# Patient Record
Sex: Female | Born: 1935 | Race: White | Hispanic: No | Marital: Married | Smoking: Never smoker
Health system: Southern US, Community
[De-identification: ages and names within clinical notes are randomized; demographics above are authoritative.]

## PROBLEM LIST (undated history)

## (undated) DIAGNOSIS — I639 Cerebral infarction, unspecified: Secondary | ICD-10-CM

## (undated) DIAGNOSIS — E079 Disorder of thyroid, unspecified: Secondary | ICD-10-CM

## (undated) DIAGNOSIS — K222 Esophageal obstruction: Secondary | ICD-10-CM

## (undated) DIAGNOSIS — I739 Peripheral vascular disease, unspecified: Secondary | ICD-10-CM

## (undated) DIAGNOSIS — K8689 Other specified diseases of pancreas: Secondary | ICD-10-CM

## (undated) DIAGNOSIS — I4891 Unspecified atrial fibrillation: Secondary | ICD-10-CM

## (undated) DIAGNOSIS — H9190 Unspecified hearing loss, unspecified ear: Secondary | ICD-10-CM

## (undated) DIAGNOSIS — S82409A Unspecified fracture of shaft of unspecified fibula, initial encounter for closed fracture: Secondary | ICD-10-CM

## (undated) DIAGNOSIS — E785 Hyperlipidemia, unspecified: Secondary | ICD-10-CM

## (undated) DIAGNOSIS — G459 Transient cerebral ischemic attack, unspecified: Secondary | ICD-10-CM

## (undated) DIAGNOSIS — K219 Gastro-esophageal reflux disease without esophagitis: Secondary | ICD-10-CM

## (undated) DIAGNOSIS — Z8719 Personal history of other diseases of the digestive system: Secondary | ICD-10-CM

## (undated) DIAGNOSIS — I10 Essential (primary) hypertension: Secondary | ICD-10-CM

## (undated) DIAGNOSIS — C4490 Unspecified malignant neoplasm of skin, unspecified: Secondary | ICD-10-CM

## (undated) DIAGNOSIS — M797 Fibromyalgia: Secondary | ICD-10-CM

## (undated) HISTORY — DX: Hyperlipidemia, unspecified: E78.5

## (undated) HISTORY — DX: Peripheral vascular disease, unspecified: I73.9

## (undated) HISTORY — DX: Essential (primary) hypertension: I10

## (undated) HISTORY — DX: Fibromyalgia: M79.7

## (undated) HISTORY — DX: Disorder of thyroid, unspecified: E07.9

---
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
## 1968-03-26 HISTORY — PX: VAGINAL HYSTERECTOMY: SUR661

## 1978-03-26 HISTORY — PX: PARATHYROIDECTOMY: SHX19

## 2004-03-26 DIAGNOSIS — I639 Cerebral infarction, unspecified: Secondary | ICD-10-CM

## 2004-03-26 HISTORY — DX: Cerebral infarction, unspecified: I63.9

## 2004-03-31 ENCOUNTER — Inpatient Hospital Stay (HOSPITAL_COMMUNITY): Admission: EM | Admit: 2004-03-31 | Discharge: 2004-04-04 | Payer: Self-pay | Admitting: Emergency Medicine

## 2004-11-02 ENCOUNTER — Ambulatory Visit: Payer: Self-pay

## 2004-12-28 ENCOUNTER — Ambulatory Visit: Payer: Self-pay

## 2006-01-01 ENCOUNTER — Ambulatory Visit: Payer: Self-pay

## 2007-01-07 ENCOUNTER — Ambulatory Visit: Payer: Self-pay

## 2008-01-08 ENCOUNTER — Ambulatory Visit: Payer: Self-pay

## 2008-08-18 ENCOUNTER — Encounter: Admission: RE | Admit: 2008-08-18 | Discharge: 2008-08-18 | Payer: Self-pay | Admitting: Rheumatology

## 2008-09-03 ENCOUNTER — Encounter: Admission: RE | Admit: 2008-09-03 | Discharge: 2008-09-03 | Payer: Self-pay | Admitting: Rheumatology

## 2009-01-04 ENCOUNTER — Encounter: Payer: Self-pay | Admitting: Internal Medicine

## 2009-01-04 LAB — CONVERTED CEMR LAB
AST: 20 units/L
Alkaline Phosphatase: 47 units/L
Basophils Relative: 0.8 %
CO2: 28 meq/L
Calcium: 9.6 mg/dL
Creatinine, Ser: 0.8 mg/dL
Eosinophils Relative: 2.2 %
Glucose, Bld: 81 mg/dL
Hemoglobin: 12.8 g/dL
Neutrophils Relative %: 67.5 %
Platelets: 301 10*3/uL
RBC: 4.03 M/uL
Sodium: 140 meq/L
Total Bilirubin: 0.8 mg/dL
Triglyceride fasting, serum: 89 mg/dL
WBC: 6.7 10*3/uL

## 2009-02-23 DIAGNOSIS — K8689 Other specified diseases of pancreas: Secondary | ICD-10-CM

## 2009-02-23 HISTORY — DX: Other specified diseases of pancreas: K86.89

## 2009-03-01 ENCOUNTER — Ambulatory Visit: Payer: Self-pay

## 2009-03-19 ENCOUNTER — Encounter: Payer: Self-pay | Admitting: Internal Medicine

## 2009-03-19 ENCOUNTER — Inpatient Hospital Stay (HOSPITAL_COMMUNITY): Admission: EM | Admit: 2009-03-19 | Discharge: 2009-03-25 | Payer: Self-pay | Admitting: Emergency Medicine

## 2009-03-19 ENCOUNTER — Encounter (INDEPENDENT_AMBULATORY_CARE_PROVIDER_SITE_OTHER): Payer: Self-pay | Admitting: Internal Medicine

## 2009-03-19 LAB — CONVERTED CEMR LAB
Albumin: 3.9 g/dL
BUN: 17 mg/dL
Chloride: 100 meq/L
Creatinine, Ser: 1.05 mg/dL
HCT: 42.7 %
Hemoglobin: 14.4 g/dL
Hgb A1c MFr Bld: 5.8 %
MCV: 93.9 fL
Monocytes Relative: 6 %
Platelets: 227 10*3/uL
Potassium: 4 meq/L
RBC: 4.55 M/uL
Sodium: 139 meq/L
Total Bilirubin: 0.6 mg/dL
Total Protein: 7 g/dL

## 2009-03-20 ENCOUNTER — Encounter: Payer: Self-pay | Admitting: Internal Medicine

## 2009-03-20 LAB — CONVERTED CEMR LAB
BUN: 14 mg/dL
Cholesterol: 208 mg/dL
Creatinine, Ser: 0.73 mg/dL
Glucose, Bld: 103 mg/dL
HDL: 49 mg/dL
MCV: 93.3 fL
RDW: 13.1 %
Sodium: 137 meq/L
TSH: 2.431 microintl units/mL
Triglyceride fasting, serum: 184 mg/dL

## 2009-03-21 ENCOUNTER — Encounter (INDEPENDENT_AMBULATORY_CARE_PROVIDER_SITE_OTHER): Payer: Self-pay | Admitting: Internal Medicine

## 2009-03-21 ENCOUNTER — Ambulatory Visit: Payer: Self-pay | Admitting: Vascular Surgery

## 2009-03-24 ENCOUNTER — Encounter: Payer: Self-pay | Admitting: Internal Medicine

## 2009-03-24 LAB — CONVERTED CEMR LAB
BUN: 15 mg/dL
Chloride: 102 meq/L
Hemoglobin: 12.3 g/dL
Platelets: 229 10*3/uL
WBC: 5.6 10*3/uL

## 2009-03-25 ENCOUNTER — Encounter: Payer: Self-pay | Admitting: Internal Medicine

## 2009-03-25 LAB — CONVERTED CEMR LAB
BUN: 16 mg/dL
Glucose, Bld: 92 mg/dL
HCT: 37.9 %
Hemoglobin: 12.8 g/dL
Lymphocytes, automated: 31 %
MCV: 93.9 fL
Platelets: 247 10*3/uL
Potassium: 4.2 meq/L
Sodium: 140 meq/L

## 2009-03-31 ENCOUNTER — Encounter: Payer: Self-pay | Admitting: Internal Medicine

## 2009-03-31 DIAGNOSIS — K5900 Constipation, unspecified: Secondary | ICD-10-CM | POA: Insufficient documentation

## 2009-03-31 DIAGNOSIS — Z87448 Personal history of other diseases of urinary system: Secondary | ICD-10-CM | POA: Insufficient documentation

## 2009-03-31 DIAGNOSIS — E785 Hyperlipidemia, unspecified: Secondary | ICD-10-CM | POA: Insufficient documentation

## 2009-03-31 DIAGNOSIS — Z8679 Personal history of other diseases of the circulatory system: Secondary | ICD-10-CM | POA: Insufficient documentation

## 2009-03-31 DIAGNOSIS — K449 Diaphragmatic hernia without obstruction or gangrene: Secondary | ICD-10-CM | POA: Insufficient documentation

## 2009-03-31 DIAGNOSIS — I10 Essential (primary) hypertension: Secondary | ICD-10-CM

## 2009-03-31 DIAGNOSIS — IMO0001 Reserved for inherently not codable concepts without codable children: Secondary | ICD-10-CM | POA: Insufficient documentation

## 2009-03-31 DIAGNOSIS — I4891 Unspecified atrial fibrillation: Secondary | ICD-10-CM | POA: Insufficient documentation

## 2009-04-01 ENCOUNTER — Ambulatory Visit: Payer: Self-pay | Admitting: Internal Medicine

## 2009-07-11 ENCOUNTER — Ambulatory Visit: Payer: Self-pay | Admitting: Internal Medicine

## 2009-07-11 DIAGNOSIS — J309 Allergic rhinitis, unspecified: Secondary | ICD-10-CM | POA: Insufficient documentation

## 2009-07-18 ENCOUNTER — Encounter: Payer: Self-pay | Admitting: Internal Medicine

## 2009-07-20 ENCOUNTER — Telehealth: Payer: Self-pay | Admitting: Internal Medicine

## 2009-08-15 ENCOUNTER — Ambulatory Visit: Payer: Self-pay | Admitting: Internal Medicine

## 2009-10-04 ENCOUNTER — Ambulatory Visit: Payer: Self-pay | Admitting: Internal Medicine

## 2009-12-14 ENCOUNTER — Ambulatory Visit: Payer: Self-pay | Admitting: Internal Medicine

## 2009-12-14 DIAGNOSIS — L02619 Cutaneous abscess of unspecified foot: Secondary | ICD-10-CM

## 2009-12-14 DIAGNOSIS — L03119 Cellulitis of unspecified part of limb: Secondary | ICD-10-CM

## 2009-12-14 DIAGNOSIS — N390 Urinary tract infection, site not specified: Secondary | ICD-10-CM

## 2009-12-14 LAB — CONVERTED CEMR LAB
Bilirubin Urine: NEGATIVE
Protein, U semiquant: NEGATIVE

## 2009-12-22 ENCOUNTER — Encounter: Payer: Self-pay | Admitting: Internal Medicine

## 2009-12-27 ENCOUNTER — Ambulatory Visit: Payer: Self-pay | Admitting: Internal Medicine

## 2009-12-28 ENCOUNTER — Ambulatory Visit: Payer: Self-pay | Admitting: Internal Medicine

## 2009-12-28 ENCOUNTER — Ambulatory Visit: Payer: Self-pay

## 2009-12-28 ENCOUNTER — Telehealth: Payer: Self-pay | Admitting: Internal Medicine

## 2009-12-28 DIAGNOSIS — S91009A Unspecified open wound, unspecified ankle, initial encounter: Secondary | ICD-10-CM

## 2009-12-28 DIAGNOSIS — S81809A Unspecified open wound, unspecified lower leg, initial encounter: Secondary | ICD-10-CM

## 2009-12-28 DIAGNOSIS — S81009A Unspecified open wound, unspecified knee, initial encounter: Secondary | ICD-10-CM | POA: Insufficient documentation

## 2009-12-28 DIAGNOSIS — I739 Peripheral vascular disease, unspecified: Secondary | ICD-10-CM | POA: Insufficient documentation

## 2009-12-29 ENCOUNTER — Telehealth: Payer: Self-pay | Admitting: Cardiovascular Disease

## 2010-01-02 ENCOUNTER — Encounter (HOSPITAL_BASED_OUTPATIENT_CLINIC_OR_DEPARTMENT_OTHER)
Admission: RE | Admit: 2010-01-02 | Discharge: 2010-03-16 | Payer: Self-pay | Source: Home / Self Care | Attending: General Surgery | Admitting: General Surgery

## 2010-01-03 ENCOUNTER — Ambulatory Visit: Payer: Self-pay | Admitting: Cardiovascular Disease

## 2010-01-09 ENCOUNTER — Encounter: Payer: Self-pay | Admitting: Internal Medicine

## 2010-01-12 ENCOUNTER — Encounter: Payer: Self-pay | Admitting: Internal Medicine

## 2010-01-13 ENCOUNTER — Ambulatory Visit: Payer: Self-pay | Admitting: Family Medicine

## 2010-01-30 ENCOUNTER — Encounter: Payer: Self-pay | Admitting: Internal Medicine

## 2010-02-09 ENCOUNTER — Ambulatory Visit: Payer: Self-pay | Admitting: Family Medicine

## 2010-02-22 ENCOUNTER — Ambulatory Visit (HOSPITAL_COMMUNITY)
Admission: RE | Admit: 2010-02-22 | Discharge: 2010-02-22 | Payer: Self-pay | Source: Home / Self Care | Admitting: General Surgery

## 2010-02-22 ENCOUNTER — Telehealth: Payer: Self-pay | Admitting: Family Medicine

## 2010-02-28 ENCOUNTER — Encounter: Payer: Self-pay | Admitting: Internal Medicine

## 2010-03-08 ENCOUNTER — Encounter: Payer: Self-pay | Admitting: Internal Medicine

## 2010-04-12 ENCOUNTER — Telehealth: Payer: Self-pay | Admitting: Family Medicine

## 2010-04-25 NOTE — Progress Notes (Addendum)
Summary: PV appt  Phone Note Call from Patient   Caller: niece Pamela [name] [phone] Reason for Call: Talk to Nurse Summary of Call: pt had doppler yesterday, wants to know when she neds appt Initial call taken by: Glynda Jaeger,  December 29, 2009 10:38 AM  Follow-up for Phone Call        This pt has a new PV appt with Dr Excell Seltzer on 10/28.  Dr Clifton James has availability next week.  I will schedule this pt to see Dr Clifton James next week due to abnormal results on LEA.  Left message for [name] to call back. Julieta Gutting, RN, BSN  December 29, 2009 12:17 PM  I spoke with [name] and she did get the message about appt with Dr Excell Seltzer on 10/28 but she feels that the pt should be seen sooner.  I have rescheduled the pt to see Dr Clifton James on 01/03/10 for follow-up of abnormal PV study.  Follow-up by: Julieta Gutting, RN, BSN,  December 29, 2009 12:25 PM

## 2010-04-25 NOTE — Assessment & Plan Note (Addendum)
Summary: FU Pamela [name]   Vital Signs:  Patient profile:   77 year old female Height:      65 inches (165.10 cm) Weight:      112.0 pounds (50.91 kg) O2 Sat:      95 % on Room air Temp:     98.1 degrees F (36.72 degrees C) oral Pulse rate:   54 / minute BP sitting:   132 / 82  (left arm) Cuff size:   regular  Vitals Entered By: Orlan Leavens (October 04, 2009 1:43 PM)  O2 Flow:  Room air CC: 3 month follow-up Is Patient Diabetic? No Pain Assessment Patient in pain? no        Primary Care Provider:  Newt Lukes MD  CC:  3 month follow-up.  History of Present Illness: here for followup -  1)PAF - concverted back to NSR spont before hosp dc 02/2009- now on  BBloc - no known recurrence  denies problems with med - no fatigue or dizzy symptoms - no CP or palp or SOB  2) fibromyalgia - chroinc pain in neck and shoulder/arm, predom right side followed prev with dr. Kellie Simmering (rheum) for same -  for years, has taken occ 1/2 tab vicodin with good relief of pain symptoms   3) hx CVA affecting right upper body, head, neck and RUE -  no weakness or numbness but +parasthesia and pain that "come and go" -  also complete loss of hearing on right side due to CVA no new weakness or  other chroinc deficits   4) HTN - prev running high due to taking claritin D for allg- no CP, Ha or recurrent CVA/TIA symptoms  - no vision changes - allg better since taking OTC meds - home log BP readings reviewed -  SBP 130s-140s -reports compliance with ongoing medical treatment and no changes in medication dose or frequency. denies adverse side effects related to current therapy.   5) GERD - occ symptoms rlated to foods - no abd pain - use tums as needed, less than 1/week   Current Medications (verified): 1)  Metoprolol Tartrate 25 Mg Tabs (Metoprolol Tartrate) .... Take 1 Two Times A Day 2)  Senokot 8.6 Mg Tabs (Sennosides) .... Take 1 By Mouth Two Times A Day - Hold For Loose Bowels 3)  Aspirin 325 Mg  Tabs (Aspirin) .... Once Daily 4)  Calcium Carbonate 600 Mg Tabs (Calcium Carbonate) .... Once Daily 5)  Vicodin 5-500 Mg Tabs (Hydrocodone-Acetaminophen) .... Take 1/2 - 1 Tab By Mouth Two Times A Day 6)  Vitamin C 500 Mg Tabs (Ascorbic Acid) .... Once Daily 7)  Centrum Silver  Tabs (Multiple Vitamins-Minerals) .... Once Daily 8)  Fish Oil 300 Mg Caps (Omega-3 Fatty Acids) .Marland Kitchen.. 1 By Mouth Bid 9)  Losartan Potassium 100 Mg Tabs (Losartan Potassium) .Marland Kitchen.. 1 By Mouth Once Daily  Allergies (verified): 1)  ! * Antibiotics 2)  ! * Cholestrol Meds  Past History:  Past Medical History: Hyperlipidemia Hypertension PAF - spont conv NSR 02/2009 hosp fibromyalgia Noncontricting schatzki ring per EGD 02/2009 Abnormal CT abdomen: cystic area in pancreatic head (02/2009) hx hyperparathroid, s/p surg in 1980s  MD roster: rheum Kellie Simmering  GI - [facility] -[place] [facility]    Review of Systems  The patient denies chest pain, syncope, peripheral edema, and depression.    Physical Exam  General:  spry, alert, well-developed, well-nourished, and cooperative to examination.   niece [name] at side Lungs:  normal respiratory effort,  no intercostal retractions or use of accessory muscles; normal breath sounds bilaterally - no crackles and no wheezes.    Heart:  normal rate, regular rhythm, no murmur, and no rub. BLE without edema.  Neurologic:  alert & oriented X3 and cranial nerves II-XII symetrically intact.  strength normal in all extremities, sensation intact to light touch, and gait normal. speech fluent without dysarthria or aphasia; follows commands with good comprehension.    Impression & Recommendations:  Problem # 1:  PAROXYSMAL ATRIAL FIBRILLATION (ICD-427.31)  hosp 02/2009 again reviewed - in nsr today - cont bbloc as also helping htn - no anticoag indicated with adv age  Her updated medication list for this problem includes:    Metoprolol Tartrate 25 Mg Tabs (Metoprolol tartrate) .Marland Kitchen...  Take 1 two times a day    Aspirin 325 Mg Tabs (Aspirin) ..... Once daily  Problem # 2:  HYPERTENSION (ICD-401.9)  Her updated medication list for this problem includes:    Metoprolol Tartrate 25 Mg Tabs (Metoprolol tartrate) .Marland Kitchen... Take 1 two times a day    Losartan Potassium 100 Mg Tabs (Losartan potassium) .Marland Kitchen... 1 by mouth once daily  amlopdiine caused fatigue--- suspect more likely related to Bbloc - but given PAF hosp 02/2009 - leave low dose BBloc and change amlodipine to ARB avoid diuretic due to tendency towards dehydration -  Time spent with patient/family 25 minutes, more than 50% of this time was spent counseling patient onhypertension  BP today: 132/82 Prior BP: 160/72 (08/15/2009)  Labs Reviewed: K+: 4.2 (03/25/2009) Creat: : 0.81 (03/25/2009)   Chol: 208 (03/20/2009)   HDL: 49 (03/20/2009)   LDL: 122 (03/20/2009)   TG: 184 (03/20/2009)  Problem # 3:  HYPERLIPIDEMIA (ICD-272.4)  on fish oil, no statins  Labs Reviewed: SGOT: 33 (03/19/2009)   SGPT: 17 (03/19/2009)   HDL:49 (03/20/2009), 64.90 (01/04/2009)  LDL:122 (03/20/2009), 68 (29/52/8413)  Chol:208 (03/20/2009), 151 (01/04/2009)  Trig:184 (03/20/2009), 89 (01/04/2009)  Problem # 4:  FIBROMYALGIA (ICD-729.1)  Her updated medication list for this problem includes:    Aspirin 325 Mg Tabs (Aspirin) ..... Once daily    Vicodin 5-500 Mg Tabs (Hydrocodone-acetaminophen) .Marland Kitchen... Take 1/2 - 1 tab by mouth two times a day  continue as needed vicodin as reports has been released from care of rheum who prev rx'd same -  use and Se reviewed today - likely exac constipation symptoms   Complete Medication List: 1)  Metoprolol Tartrate 25 Mg Tabs (Metoprolol tartrate) .... Take 1 two times a day 2)  Senokot 8.6 Mg Tabs (Sennosides) .... Take 1 by mouth two times a day - hold for loose bowels 3)  Aspirin 325 Mg Tabs (Aspirin) .... Once daily 4)  Calcium Carbonate 600 Mg Tabs (Calcium carbonate) .... Once daily 5)  Vicodin  5-500 Mg Tabs (Hydrocodone-acetaminophen) .... Take 1/2 - 1 tab by mouth two times a day 6)  Vitamin C 500 Mg Tabs (Ascorbic acid) .... Once daily 7)  Centrum Silver Tabs (Multiple vitamins-minerals) .... Once daily 8)  Fish Oil 300 Mg Caps (Omega-3 fatty acids) .Marland Kitchen.. 1 by mouth bid 9)  Losartan Potassium 100 Mg Tabs (Losartan potassium) .Marland Kitchen.. 1 by mouth once daily  Patient Instructions: 1)  it was good to see you today. 2)  continue medications as ongoing - no changes recommended 3)  Please keep 3 month follow-up appointment at [facility] as previously scheduled, may call here between now and then if any problems.  Prescriptions: METOPROLOL TARTRATE 25 MG TABS (  METOPROLOL TARTRATE) take 1 two times a day  #60 x 5   Entered by:   Orlan Leavens   Authorized by:   Newt Lukes MD   Signed by:   Orlan Leavens on 10/04/2009   Method used:   Electronically to        [facility]. [address] [phone]* (retail)       [address] [place], [place]  [id number]       Ph: [phone]       Fax: [phone]   RxID:   [id number]

## 2010-04-25 NOTE — Assessment & Plan Note (Addendum)
Summary: NEW PT TO EST/TRANSFER FROM DR LESCHBER/CLE   Vital Signs:  Patient profile:   77 year old female Height:      65 inches Weight:      114 pounds BMI:     19.04 Temp:     97.4 degrees F oral Pulse rate:   60 / minute Pulse rhythm:   regular BP sitting:   136 / 72  (right arm) Cuff size:   regular  Vitals Entered By: Linde Gillis CMA Duncan Dull) (January 13, 2010 1:49 PM) CC: new patient, establish care   History of Present Illness: 77 yo here to establish care.   Left leg wound- wound precipitated by injury to left ankle 10/2009- log rolled onto foot.  Has been going to wound center every week.  On Doxycyline 100 mg bid.  Previously failed a course of Septra.  denies fever - no other skin wounds no difficulty walking Pain improving with Hydrocodone but still wakes up early in the morning with pain. Gabapentin not helping and making her dizzy and forgetful.   PVD-  followed by Dr. Clifton James.  Last visit was last week.  Has severe disease in left leg but bilateral disease.  Does not wish to have invasive procedures.     HTN - was very high in vascular office last week. Amlodipine 5 mg was added to Losartan 100 mg daily and Metoprolol 25 mg two times a day.  Has not noticed any side effects and BP is much better controlled.   Current Medications (verified): 1)  Metoprolol Tartrate 25 Mg Tabs (Metoprolol Tartrate) .... Take 1 Two Times A Day 2)  Senokot 8.6 Mg Tabs (Sennosides) .... Take 1 By Mouth Two Times A Day - Hold For Loose Bowels 3)  Aspirin 325 Mg Tabs (Aspirin) .... Once Daily 4)  Calcium Carbonate 600 Mg Tabs (Calcium Carbonate) .... Once Daily 5)  Vicodin 5-500 Mg Tabs (Hydrocodone-Acetaminophen) .... Take 1/2 - 1 Tab By Mouth Two Times A Day 6)  Vitamin C 500 Mg Tabs (Ascorbic Acid) .... Once Daily 7)  Centrum Silver  Tabs (Multiple Vitamins-Minerals) .... Once Daily 8)  Fish Oil 300 Mg Caps (Omega-3 Fatty Acids) .Marland Kitchen.. 1 Cap Once Daily 9)  Losartan Potassium 100 Mg  Tabs (Losartan Potassium) .Marland Kitchen.. 1 By Mouth Once Daily 10)  Bactroban 2 % Oint (Mupirocin) .... Apply Two Times A Day To Affected Skin With Dressing Change 11)  Amlodipine Besylate 5 Mg Tabs (Amlodipine Besylate) .... Take One Tablet By Mouth Daily 12)  Doxycycline Hyclate 100 Mg Caps (Doxycycline Hyclate) .... Take One Tablet By Mouth Two Times A Day 13)  Tramadol Hcl 50 Mg  Tabs (Tramadol Hcl) .Marland Kitchen.. 1-2 Tab Two Times A Day As Needed For Pain  Allergies: 1)  ! * Antibiotics 2)  ! * Cholestrol Meds  Past History:  Past Medical History: Last updated: 01/03/2010 Hyperlipidemia Hypertension  PAF - spont conv NSR 02/2009 hosp fibromyalgia Nonconstricting schatzki ring - EGD 02/2009 Abnormal CT abdomen: cystic area in pancreatic head (02/2009) hx hyperparathroid, s/p surg in 1980s PAD CVA 2007    MD roster: Justine Null Kellie Simmering  GI - [facility] -[place] ent    Past Surgical History: Last updated: 01/03/2010 Appendectomy (1940) Hysterectomy (1970's) Stroke (2007) - right side residual pain parathyroidectomy - 1980s  Family History: Last updated: 04/01/2009 Family History Breast cancer 1st degree relative <50 (parent) Family History of Prostate CA 1st degree relative <50 (parent)  Social History: Last updated: 04/01/2009 Never Smoked  no alcohol widowed, lives alone in her home in [place] supportive family nearby [place] and independant with ADLs including driving, cooking, bathing, dressing, etc  Risk Factors: Alcohol Use: 0 (04/01/2009) Exercise: yes (04/01/2009)  Risk Factors: Smoking Status: never (04/01/2009)  Review of Systems      See HPI General:  Denies fever and malaise. Eyes:  Denies blurring. ENT:  Complains of decreased hearing; denies difficulty swallowing. CV:  Denies chest pain or discomfort. Resp:  Denies shortness of breath. GI:  Denies abdominal pain and change in bowel habits. MS:  Denies muscle weakness. Derm:  Denies rash. Neuro:  Denies  headaches and visual disturbances. Psych:  Denies anxiety and depression. Endo:  Denies cold intolerance and heat intolerance.  Physical Exam  General:  alert, well-developed, well-nourished, and cooperative to examination, with her niece [name]. Head:  normocephalic and atraumatic.   Eyes:  vision grossly intact and pupils equal.   Ears:  R ear normal and L ear normal.   Nose:  no external deformity.   Mouth:  MMM Lungs:  normal respiratory effort, no intercostal retractions or use of accessory muscles; normal breath sounds bilaterally - no crackles and no wheezes.    Heart:  normal rate, regular rhythm, no murmur, and no rub. RLE without edema Abdomen:  soft, non-tender, normal bowel sounds, no distention; no masses and no appreciable hepatomegaly or splenomegaly.   Extremities:  LLE leg wound- bandage c/d/i, no edema. Neurologic:  alert & oriented X3 and gait normal.   Psych:  Oriented X3, memory intact for recent and remote, normally interactive, good eye contact, not anxious appearing, not depressed appearing, and not agitated.      Impression & Recommendations:  Problem # 1:  UNSPECIFIED PERIPHERAL VASCULAR DISEASE (ICD-443.9) Assessment Unchanged Followed by Dr. Clifton James, will follow up with him once her wound has healed.  Problem # 2:  WOUND, LEG (ICD-891.0) Assessment: Improved Finish abx, continue follow up with wound center. Will try Tramadol in addition to her vicodin as she is concerned that increase dose with cause constipation (h/o impaction).  Problem # 3:  HYPERTENSION (ICD-401.9) Assessment: Improved Improved with addition of Amlodipine.   Her updated medication list for this problem includes:    Metoprolol Tartrate 25 Mg Tabs (Metoprolol tartrate) .Marland Kitchen... Take 1 two times a day    Losartan Potassium 100 Mg Tabs (Losartan potassium) .Marland Kitchen... 1 by mouth once daily    Amlodipine Besylate 5 Mg Tabs (Amlodipine besylate) .Marland Kitchen... Take one tablet by mouth daily  Complete  Medication List: 1)  Metoprolol Tartrate 25 Mg Tabs (Metoprolol tartrate) .... Take 1 two times a day 2)  Senokot 8.6 Mg Tabs (Sennosides) .... Take 1 by mouth two times a day - hold for loose bowels 3)  Aspirin 325 Mg Tabs (Aspirin) .... Once daily 4)  Calcium Carbonate 600 Mg Tabs (Calcium carbonate) .... Once daily 5)  Vicodin 5-500 Mg Tabs (Hydrocodone-acetaminophen) .... Take 1/2 - 1 tab by mouth two times a day 6)  Vitamin C 500 Mg Tabs (Ascorbic acid) .... Once daily 7)  Centrum Silver Tabs (Multiple vitamins-minerals) .... Once daily 8)  Fish Oil 300 Mg Caps (Omega-3 fatty acids) .Marland Kitchen.. 1 cap once daily 9)  Losartan Potassium 100 Mg Tabs (Losartan potassium) .Marland Kitchen.. 1 by mouth once daily 10)  Bactroban 2 % Oint (Mupirocin) .... Apply two times a day to affected skin with dressing change 11)  Amlodipine Besylate 5 Mg Tabs (Amlodipine besylate) .... Take one tablet by mouth daily 12)  Doxycycline Hyclate 100 Mg Caps (Doxycycline hyclate) .... Take one tablet by mouth two times a day 13)  Tramadol Hcl 50 Mg Tabs (Tramadol hcl) .Marland Kitchen.. 1-2 tab two times a day as needed for pain  Patient Instructions: 1)  Great to meet you. 2)  Please stop taking the Neurontin. 3)  You can try increasing your Vicodin to 1 tablet at bedtime and use Tramadol as needed for pain. 4)  Please come see me in a few weeks. Prescriptions: TRAMADOL HCL 50 MG  TABS (TRAMADOL HCL) 1-2 tab two times a day as needed for pain  #60 x 0   Entered and Authorized by:   Ruthe Mannan MD   Signed by:   Ruthe Mannan MD on 01/13/2010   Method used:   Print then Give to Patient   RxID:   [id number]    Orders Added: 1)  New Patient Level III [99203]    Current Allergies (reviewed today): ! * ANTIBIOTICS ! * CHOLESTROL MEDS

## 2010-04-25 NOTE — Assessment & Plan Note (Addendum)
Summary: NEW / [employer] MEDICARE / NWS #   Vital Signs:  Patient profile:   77 year old female Height:      65 inches (165.10 cm) Weight:      112.0 pounds (50.91 kg) O2 Sat:      96 % on Room air Temp:     98.0 degrees F (36.67 degrees C) oral Pulse rate:   58 / minute BP sitting:   130 / 72  (left arm) Cuff size:   regular  Vitals Entered By: Orlan Leavens (April 01, 2009 11:07 AM)  O2 Flow:  Room air CC: New patient Is Patient Diabetic? No Pain Assessment Patient in pain? no        Primary Care Provider:  Newt Lukes MD  CC:  New patient.  History of Present Illness: new pt to me and our practice here to est care -  recent hosp at [facility] 1225-12/31 for LLQ abd pain - experienced PAF and had throrough GI eval  1)PAF - concverted back to NSR spont before dc - now on new BBloc -  denies problems with med - no CP or palp or SOB  2) fibromyalgia - chroinc pain in neck and shoulder/arm, predom right side follows with dr. Kellie Simmering -  takes 1/2 tab vicodin with good releif of pain symptoms   3) hx CVA affecting right upper body, head, neck and RUE -  no weakness or numbness but +parasthesia and pain that "come and go" -  also complete loss of hearing on right side due to CVA no new weakness or  other chroinc deficits   4) constipation - exac by narc use - takes OTC sennakot with generally good relief but still hard and small BMs no abd pain at this time and none since dc from hosp last week  Preventive Screening-Counseling & Management  Alcohol-Tobacco     Alcohol drinks/day: 0     Alcohol Counseling: not indicated; patient does not drink     Smoking Status: never     Tobacco Counseling: not indicated; no tobacco use  Caffeine-Diet-Exercise     Does Patient Exercise: yes     Type of exercise: walking, outdoors     Exercise Counseling: not indicated; exercise is adequate  Safety-Violence-Falls     Fall Risk Counseling: not indicated; no significant falls  noted  Clinical Review Panels:  Prevention   Last Mammogram:  No specific mammographic evidence of malignancy.   (02/23/2009)  Immunizations   Last Tetanus Booster:  Historical (03/26/2004)   Last Flu Vaccine:  Historical (11/24/2008)   Last Pneumovax:  Historical (03/26/2006)  Lipid Management   Cholesterol:  208 (03/20/2009)   LDL (bad choesterol):  122 (03/20/2009)   HDL (good cholesterol):  49 (03/20/2009)   Triglycerides:  184 (03/20/2009)  CBC   WBC:  7.0 (03/25/2009)   RBC:  4.04 (03/25/2009)   Hgb:  12.8 (03/25/2009)   Hct:  37.9 (03/25/2009)   Platelets:  247 (03/25/2009)   MCV  93.9 (03/25/2009)   RDW  13.3 (03/25/2009)   PMN:  55 (03/25/2009)   Monos:  11 (03/25/2009)   Eosinophils:  2 (03/25/2009)   Basophil:  1 (03/25/2009)  Complete Metabolic Panel   Glucose:  92 (03/25/2009)   Sodium:  140 (03/25/2009)   Potassium:  4.2 (03/25/2009)   Chloride:  103 (03/25/2009)   CO2:  32 (03/25/2009)   BUN:  16 (03/25/2009)   Creatinine:  0.81 (03/25/2009)   Albumin:  3.9 (  03/19/2009)   Total Protein:  7.0 (03/19/2009)   Calcium:  8.9 (03/25/2009)   Total Bili:  0.6 (03/19/2009)   Alk Phos:  86 (03/19/2009)   SGPT (ALT):  17 (03/19/2009)   SGOT (AST):  33 (03/19/2009)   Current Medications (verified): 1)  Metoprolol Tartrate 25 Mg Tabs (Metoprolol Tartrate) .... Take 1 Two Times A Day 2)  Senokot 8.6 Mg Tabs (Sennosides) .... Take 1 Once Daily As Needed 3)  Aspirin 325 Mg Tabs (Aspirin) .... Once Daily 4)  Calcium Carbonate 600 Mg Tabs (Calcium Carbonate) .... Once Daily 5)  Vicodin 5-500 Mg Tabs (Hydrocodone-Acetaminophen) .... Take 1/2 By Mouth Once Daily 6)  Vitamin C 500 Mg Tabs (Ascorbic Acid) .... Once Daily 7)  Centrum Silver  Tabs (Multiple Vitamins-Minerals) .... Once Daily  Allergies (verified): 1)  ! * Antibiotics 2)  ! * Cholestrol Meds  Past History:  Past Medical History: Hyperlipidemia Hypertension PAF - spont conv NSR 02/2009  hosp fibromyalgia Noncontricting schatzki ring per EGD 02/2009 Abnormal CT abdomen: cystic area in pancreatic head (02/2009) hx hyperparathroid, s/p surg in 1980s  MD rooster: rheum - [facility] - [facility]  Past Surgical History: Appendectomy (2025) Hysterectomy (1970's) Stroke (2007) - right side residual pain parathyroidectomy - 1980s  Family History: Family History Breast cancer 1st degree relative <50 (parent) Family History of Prostate CA 1st degree relative <50 (parent)  Social History: Never Smoked no alcohol widowed, lives alone in her home in [place] supportive family nearby [place] and independant with ADLs including driving, cooking, bathing, dressing, etc Smoking Status:  never Does Patient Exercise:  yes  Review of Systems       see HPI above. I have reviewed all other systems and they were negative.   Physical Exam  General:  spry, alert, well-developed, well-nourished, and cooperative to examination.   niece [name] at side Eyes:  vision grossly intact; pupils equal, round and reactive to light.  conjunctiva and lids normal.    Ears:  normal pinnae bilaterally, without erythema, swelling, or tenderness to palpation. TMs clear, without effusion, or cerumen impaction. Hearing diminished on left (with hearing aide) Mouth:  teeth and gums in good repair; mucous membranes moist, without lesions or ulcers. oropharynx clear without exudate, no erythema.  Lungs:  normal respiratory effort, no intercostal retractions or use of accessory muscles; normal breath sounds bilaterally - no crackles and no wheezes.    Heart:  normal rate, regular rhythm, no murmur, and no rub. BLE without edema. normal DP pulses and normal cap refill in all 4 extremities    Abdomen:  soft, non-tender, normal bowel sounds, no distention; no masses and no appreciable hepatomegaly or splenomegaly.   Msk:  No deformity or scoliosis noted of thoracic or lumbar spine.  arthritic changes (senile) in B  hands but FROM with pain Neurologic:  alert & oriented X3 and cranial nerves II-XII symetrically intact.  strength normal in all extremities, sensation intact to light touch, and gait normal. speech fluent without dysarthria or aphasia; follows commands with good comprehension.  Psych:  Oriented X3, memory intact for recent and remote, normally interactive, good eye contact, not anxious appearing, not depressed appearing, and not agitated.      Impression & Recommendations:  Problem # 1:  HYPERTENSION (ICD-401.9)  Her updated medication list for this problem includes:    Metoprolol Tartrate 25 Mg Tabs (Metoprolol tartrate) .Marland Kitchen... Take 1 two times a day  BP today: 130/72  Labs Reviewed: K+: 4.2 (03/25/2009) Creat: :  0.81 (03/25/2009)   Chol: 208 (03/20/2009)   HDL: 49 (03/20/2009)   LDL: 122 (03/20/2009)   TG: 184 (03/20/2009)  Orders: Prescription Created Electronically [phone])  Problem # 2:  HYPERLIPIDEMIA (ICD-272.4) diet controlled Labs Reviewed: SGOT: 33 (03/19/2009)   SGPT: 17 (03/19/2009)   HDL:49 (03/20/2009), 64.90 (01/04/2009)  LDL:122 (03/20/2009), 68 (60/45/4098)  Chol:208 (03/20/2009), 151 (01/04/2009)  Trig:184 (03/20/2009), 89 (01/04/2009)  Problem # 3:  FIBROMYALGIA (ICD-729.1) cont to follow with dr. Kellie Simmering as ongoing Her updated medication list for this problem includes:    Aspirin 325 Mg Tabs (Aspirin) ..... Once daily    Vicodin 5-500 Mg Tabs (Hydrocodone-acetaminophen) .Marland Kitchen... Take 1/2 by mouth once daily  Problem # 4:  CONSTIPATION (ICD-564.00) exac by narc use for fm -  advised inc senna use recent hosp, labd and testing results reviewed today  Her updated medication list for this problem includes:    Senokot 8.6 Mg Tabs (Sennosides) .Marland Kitchen... Take 1 by mouth two times a day - hold for loose bowels  Discussed dietary fiber measures and increased water intake.   Problem # 5:  PAROXYSMAL ATRIAL FIBRILLATION (ICD-427.31)  hosp reviewed - in nsr today - cont  bbloc as also helping htn - no anticoag indicated with adv age Her updated medication list for this problem includes:    Metoprolol Tartrate 25 Mg Tabs (Metoprolol tartrate) .Marland Kitchen... Take 1 two times a day    Aspirin 325 Mg Tabs (Aspirin) ..... Once daily  Orders: Prescription Created Electronically [phone])  Problem # 6:  CEREBROVASCULAR ACCIDENT, HX OF (ICD-V12.50) right side heas/neck/ue pain deficits - (vs d/t fm?) cont asa and med mgmt of [facility] risks  Time spent with patient 45 minutes, more than 50% of this time was spent counseling patient on recent hospitalization, prevention and treatment of constipation and review of chronic medical conditions.  Complete Medication List: 1)  Metoprolol Tartrate 25 Mg Tabs (Metoprolol tartrate) .... Take 1 two times a day 2)  Senokot 8.6 Mg Tabs (Sennosides) .... Take 1 by mouth two times a day - hold for loose bowels 3)  Aspirin 325 Mg Tabs (Aspirin) .... Once daily 4)  Calcium Carbonate 600 Mg Tabs (Calcium carbonate) .... Once daily 5)  Vicodin 5-500 Mg Tabs (Hydrocodone-acetaminophen) .... Take 1/2 by mouth once daily 6)  Vitamin C 500 Mg Tabs (Ascorbic acid) .... Once daily 7)  Centrum Silver Tabs (Multiple vitamins-minerals) .... Once daily 8)  Fish Oil 300 Mg Caps (Omega-3 fatty acids) .Marland Kitchen.. 1 by mouth bid  Patient Instructions: 1)  it was good to see you today.  2)  labs and tests from recent hospitalization reviewed - you look like you are doing well - 3)  increase sennokot to two times a day as discussed - other medications as ongoing - metoprolol prescription has been electronically submitted to your pharmacy. Please take as directed. Contact our office if you believe you're having problems with the medication(s).  4)  Please schedule a follow-up appointment in 3-4 months, sooner if problems.  Prescriptions: METOPROLOL TARTRATE 25 MG TABS (METOPROLOL TARTRATE) take 1 two times a day  #60 x 5   Entered by:   Orlan Leavens   Authorized  by:   Newt Lukes MD   Signed by:   Orlan Leavens on 04/01/2009   Method used:   Electronically to        [facility] [phone]* (retail)       [address]  [place], [place]  [id number]       Ph: [phone]       Fax: [phone]   RxID:   [id number]    Immunization History:  Tetanus/Td Immunization History:    Tetanus/Td:  historical (03/26/2004)  Influenza Immunization History:    Influenza:  historical (11/24/2008)  Pneumovax Immunization History:    Pneumovax:  historical (03/26/2006)    Mammogram  Procedure date:  02/23/2009  Findings:      No specific mammographic evidence of malignancy.

## 2010-04-25 NOTE — Consult Note (Addendum)
Summary: [facility]  [facility]   Imported By: Sherian Rein 01/18/2010 13:53:14  _____________________________________________________________________  External Attachment:    Type:   Image     Comment:   External Document

## 2010-04-25 NOTE — Assessment & Plan Note (Addendum)
Summary: 3WK FOLLOW UP / LFW   Vital Signs:  Patient profile:   77 year old Pamela [name] Height:      65 inches Weight:      113 pounds BMI:     18.87 Temp:     97.8 degrees F oral Pulse rate:   Pamela / minute Pulse rhythm:   regular BP sitting:   124 / Pamela  (right arm) Cuff size:   regular  Vitals Entered By: Linde Gillis CMA Duncan Dull) (February 09, 2010 12:24 PM) CC: 3 week follow up   History of Present Illness: 77 yo here for 3 week follow up.  Left leg wound- wound precipitated by injury to left ankle 10/2009- log rolled onto foot.  Has been going to wound center every week.  On Doxycyline 100 mg bid still.  Previously failed a course of Septra.  denies fever - no other skin wounds no difficulty walking. Dressing is now an alginate dressing, per pt placed yesterday and advised not to touch it until next visit at wound center.  No longer taking Vicodin. has only needed tramadol a couple of times, no noticible side effects.      HTN - was very high in vascular office last month.  Amlodipine 5 mg was added to Losartan 100 mg daily and Metoprolol 25 mg two times a day.  Has not noticed any side effects and BP is much better controlled.  Discussed Zostavax and flu shot at last office visit and she would like to get them both today.  Current Medications (verified): 1)  Metoprolol Tartrate 25 Mg Tabs (Metoprolol Tartrate) .... Take 1 Two Times A Day 2)  Senokot 8.6 Mg Tabs (Sennosides) .... Take 1 By Mouth Two Times A Day - Hold For Loose Bowels 3)  Aspirin 325 Mg Tabs (Aspirin) .... Once Daily 4)  Calcium Carbonate 600 Mg Tabs (Calcium Carbonate) .... Once Daily 5)  Vitamin C 500 Mg Tabs (Ascorbic Acid) .... Once Daily 6)  Centrum Silver  Tabs (Multiple Vitamins-Minerals) .... Once Daily 7)  Fish Oil 300 Mg Caps (Omega-3 Fatty Acids) .Marland Kitchen.. 1 Cap Once Daily 8)  Losartan Potassium 100 Mg Tabs (Losartan Potassium) .Marland Kitchen.. 1 By Mouth Once Daily 9)  Amlodipine Besylate 5 Mg Tabs (Amlodipine  Besylate) .... Take One Tablet By Mouth Daily 10)  Doxycycline Hyclate 100 Mg Caps (Doxycycline Hyclate) .... Take One Tablet By Mouth Two Times A Day 11)  Tramadol Hcl 50 Mg  Tabs (Tramadol Hcl) .Marland Kitchen.. 1-2 Tab Two Times A Day As Needed For Pain  Allergies: 1)  ! * Antibiotics 2)  ! * Cholestrol Meds  Past History:  Past Medical History: Last updated: 01/03/2010 Hyperlipidemia Hypertension  PAF - spont conv NSR 02/2009 hosp fibromyalgia Nonconstricting schatzki ring - EGD 02/2009 Abnormal CT abdomen: cystic area in pancreatic head (02/2009) hx hyperparathroid, s/p surg in 1980s PAD CVA 2007    MD roster: Justine Null Kellie Simmering  GI - [facility]    Past Surgical History: Last updated: 01/03/2010 Appendectomy (1940) Hysterectomy (1970's) Stroke (2007) - right side residual pain parathyroidectomy - 1980s  Family History: Last updated: 04/01/2009 Family History Breast cancer 1st degree relative <50 (parent) Family History of Prostate CA 1st degree relative <50 (parent)  Social History: Last updated: 04/01/2009 Never Smoked no alcohol widowed, lives alone in her home in [place] supportive family nearby [place] and independant with ADLs including driving, cooking, bathing, dressing, etc  Risk Factors: Alcohol Use: 0 (04/01/2009) Exercise: yes (04/01/2009)  Risk Factors: Smoking Status: never (04/01/2009)  Review of Systems      See HPI General:  Denies chills and fever. CV:  Denies chest pain or discomfort. Resp:  Denies shortness of breath.  Physical Exam  General:  alert, well-developed, well-nourished, and cooperative to examination, with her niece Pamela Pamela [name]. VSS, normotensive Mouth:  MMM Lungs:  normal respiratory effort, no intercostal retractions or use of accessory muscles; normal breath sounds bilaterally - no crackles and no wheezes.    Heart:  normal rate, regular rhythm, no murmur, and no rub. RLE without edema Extremities:  LLE leg wound- bandage  c/d/i, no edema. Psych:  Oriented X3, memory intact for recent and remote, normally interactive, good eye contact, not anxious appearing, not depressed appearing, and not agitated.      Impression & Recommendations:  Problem # 1:  WOUND, LEG (ICD-891.0) Assessment Improved Improving.  Continue Tramadol as needed.   follow up after last visit with wound center.  Complete Medication List: 1)  Metoprolol Tartrate 25 Mg Tabs (Metoprolol tartrate) .... Take 1 two times a day 2)  Senokot 8.6 Mg Tabs (Sennosides) .... Take 1 by mouth two times a day - hold for loose bowels 3)  Aspirin 325 Mg Tabs (Aspirin) .... Once daily 4)  Calcium Carbonate 600 Mg Tabs (Calcium carbonate) .... Once daily 5)  Vitamin C 500 Mg Tabs (Ascorbic acid) .... Once daily 6)  Centrum Silver Tabs (Multiple vitamins-minerals) .... Once daily 7)  Fish Oil 300 Mg Caps (Omega-3 fatty acids) .Marland Kitchen.. 1 cap once daily 8)  Losartan Potassium 100 Mg Tabs (Losartan potassium) .Marland Kitchen.. 1 by mouth once daily 9)  Amlodipine Besylate 5 Mg Tabs (Amlodipine besylate) .... Take one tablet by mouth daily 10)  Doxycycline Hyclate 100 Mg Caps (Doxycycline hyclate) .... Take one tablet by mouth two times a day 11)  Tramadol Hcl 50 Mg Tabs (Tramadol hcl) .Marland Kitchen.. 1-2 tab two times a day as needed for pain   Orders Added: 1)  Est. Patient Level III [16[address]]    Current Allergies (reviewed today): ! * ANTIBIOTICS ! * CHOLESTROL MEDS  Appended Document: 3WK FOLLOW UP / LFW     Allergies: 1)  ! * Antibiotics 2)  ! * Cholestrol Meds   Complete Medication List: 1)  Metoprolol Tartrate 25 Mg Tabs (Metoprolol tartrate) .... Take 1 two times a day 2)  Senokot 8.6 Mg Tabs (Sennosides) .... Take 1 by mouth two times a day - hold for loose bowels 3)  Aspirin 325 Mg Tabs (Aspirin) .... Once daily 4)  Calcium Carbonate 600 Mg Tabs (Calcium carbonate) .... Once daily 5)  Vitamin C 500 Mg Tabs (Ascorbic acid) .... Once daily 6)  Centrum Silver Tabs  (Multiple vitamins-minerals) .... Once daily 7)  Fish Oil 300 Mg Caps (Omega-3 fatty acids) .Marland Kitchen.. 1 cap once daily 8)  Losartan Potassium 100 Mg Tabs (Losartan potassium) .Marland Kitchen.. 1 by mouth once daily 9)  Amlodipine Besylate 5 Mg Tabs (Amlodipine besylate) .... Take one tablet by mouth daily 10)  Doxycycline Hyclate 100 Mg Caps (Doxycycline hyclate) .... Take one tablet by mouth two times a day 11)  Tramadol Hcl 50 Mg Tabs (Tramadol hcl) .Marland Kitchen.. 1-2 tab two times a day as needed for pain  Other Orders: Flu Vaccine 67yrs + MEDICARE PATIENTS (U0454) Administration Flu vaccine - MCR (G0008) Zoster (Shingles) Vaccine Live (09811) Admin 1st Vaccine (91478)   Orders Added: 1)  Flu Vaccine 16yrs + MEDICARE PATIENTS [Q2039] 2)  Administration Flu vaccine -  MCR [G0008] 3)  Zoster (Shingles) Vaccine Live [90736] 4)  Admin 1st Vaccine [35009]   Immunizations Administered:  Zostavax # 1:    Vaccine Type: Zostavax    Site: left deltoid    Mfr: Merck    Dose: 0.69ml    Route: [place]    Given by: Linde Gillis CMA (AAMA)    Exp. Date: 11/11/2010    Lot #: [id number]    VIS given: 01/05/05 given February 09, 2010.  Flu Vaccine Consent Questions     Do you have a history of severe allergic reactions to this vaccine? no    Any prior history of allergic reactions to egg and/or gelatin? no    Do you have a sensitivity to the preservative Thimersol? no    Do you have a past history of Guillan-Barre Syndrome? no    Do you currently have an acute febrile illness? no    Have you ever had a severe reaction to latex? no    Vaccine information given and explained to patient? yes    Are you currently pregnant? no    Lot Number:[id number]   Exp Date:09/23/2010   Site Given  Right Deltoid IM  Immunizations Administered:  Zostavax # 1:    Vaccine Type: Zostavax    Site: left deltoid    Mfr: Merck    Dose: 0.78ml    Route: [place]    Given by: Linde Gillis CMA (AAMA)    Exp. Date: 11/11/2010    Lot #: [id number]     VIS given: 01/05/05 given February 09, 2010.         Marland [email or web address]

## 2010-04-25 NOTE — Miscellaneous (Addendum)
Summary: Waiver of Liability for Zostavax  Waiver of Liability for Zostavax   Imported By: Maryln Gottron 02/17/2010 10:49:49  _____________________________________________________________________  External Attachment:    Type:   Image     Comment:   External Document

## 2010-04-25 NOTE — Assessment & Plan Note (Addendum)
Summary: 2 wk f/u #/cd   Vital Signs:  Patient profile:   77 year old female Height:      65 inches (165.10 cm) Weight:      114.4 pounds (52 kg) O2 Sat:      95 % on Room air Temp:     97.7 degrees F (36.50 degrees C) oral Pulse rate:   57 / minute BP sitting:   152 / 72  (left arm) Cuff size:   regular  Vitals Entered By: Orlan Leavens RMA (December 28, 2009 1:08 PM)  O2 Flow:  Room air CC: 2 week f/u Is Patient Diabetic? No Pain Assessment Patient in pain? no        Primary Care Provider:  Newt Lukes MD  CC:  2 week f/u.  History of Present Illness: here for followup - left foot pain/wound seen for same >2 weeks ago wound precipitated by injury to left ankle 10/2009- log rolled onto foot, scratched skin pt has been soaking in salt water and using neosporin since that time however, progressive redness and pain in area of wound over last 2 weeks denies fever - no other skin wounds no difficulty walking has been on abx (spetra) and ongoing wound care at home for past 2 weeks - foot not improved increase "weeping" from skin and pain in foot despite increased hydrocodone use  also review chronic med issues: 1) PAF - converted back to NSR spont before hosp dc 02/2009- now on  BBloc - no known recurrence  denies problems with med - no fatigue or dizzy symptoms - no CP or palp or SOB  2) fibromyalgia - chroinc pain in neck and shoulder/arm, predom right side followed prev with dr. Kellie Simmering (rheum) for same -  for years, has taken occ 1/2 tab vicodin with good relief of pain symptoms   3) hx CVA affecting right upper body, head, neck and RUE -  no weakness or numbness but +parasthesia and pain that "come and go" -  also complete loss of hearing on right side due to CVA no new weakness or  other chroinc deficits   4) HTN - prev high due to claritin D for allg- no CP, Ha or recurrent CVA/TIA symptoms  - no vision changes - home log BP readings reviewed -  SBP  130s-140s -reports compliance with ongoing medical treatment and no changes in medication dose or frequency. denies adverse side effects related to current therapy.   5) GERD - occ symptoms related to foods - no abd pain - use tums as needed, less than 1/week   Current Medications (verified): 1)  Metoprolol Tartrate 25 Mg Tabs (Metoprolol Tartrate) .... Take 1 Two Times A Day 2)  Senokot 8.6 Mg Tabs (Sennosides) .... Take 1 By Mouth Two Times A Day - Hold For Loose Bowels 3)  Aspirin 325 Mg Tabs (Aspirin) .... Once Daily 4)  Calcium Carbonate 600 Mg Tabs (Calcium Carbonate) .... Once Daily 5)  Vicodin 5-500 Mg Tabs (Hydrocodone-Acetaminophen) .... Take 1/2 - 1 Tab By Mouth Two Times A Day 6)  Vitamin C 500 Mg Tabs (Ascorbic Acid) .... Once Daily 7)  Centrum Silver  Tabs (Multiple Vitamins-Minerals) .... Once Daily 8)  Fish Oil 300 Mg Caps (Omega-3 Fatty Acids) .Marland Kitchen.. 1 By Mouth Bid 9)  Losartan Potassium 100 Mg Tabs (Losartan Potassium) .Marland Kitchen.. 1 By Mouth Once Daily 10)  Bactroban 2 % Oint (Mupirocin) .... Apply Two Times A Day To Affected Skin With Dressing  Change  Allergies (verified): 1)  ! * Antibiotics 2)  ! * Cholestrol Meds  Past History:  Past Medical History: Hyperlipidemia Hypertension  PAF - spont conv NSR 02/2009 hosp fibromyalgia Nonconstricting schatzki ring - EGD 02/2009 Abnormal CT abdomen: cystic area in pancreatic head (02/2009) hx hyperparathroid, s/p surg in 1980s    MD roster: rheum Kellie Simmering  GI - [facility] [facility]    Review of Systems       The patient complains of peripheral edema.  The patient denies fever, weight loss, chest pain, syncope, difficulty walking, and abnormal bleeding.    Physical Exam  General:  spry, alert, well-developed, well-nourished, and cooperative to examination.   niece [name] at side Lungs:  normal respiratory effort, no intercostal retractions or use of accessory muscles; normal breath sounds bilaterally - no crackles and  no wheezes.    Heart:  normal rate, regular rhythm, no murmur, and no rub. RLE without edema. LLE with dep edema related to wound - see skin Skin:  left anterior ankle with mod edema and cellulitis surrounding 2 areas of subcm ulcerations - no active drainage -but yellow tissue with poor granulation and friability Psych:  Oriented X3, memory intact for recent and remote, normally interactive, good eye contact, not anxious appearing, not depressed appearing, and not agitated.      Impression & Recommendations:  Problem # 1:  CELLULITIS, FOOT, LEFT (ICD-682.7) Assessment Deteriorated  Her updated medication list for this problem includes:    Levaquin 500 Mg Tabs (Levofloxacin) .Marland Kitchen... 1 by mouth once daily x 10d  open shallow ulcerations from prior trauma - cellulitis changes noted, not improved s/p course of septra check ABI for vascular issue given poor wound healing and refer to wound care clinic continue emperic systemic and topical abx - levaquin new rx done recheck here 2-4 weeks, sooner if problems - same explained to pt/niece who agrees use hydrocodone for pain control as needed and add neuronin for ?neuropathic tx of severe pain (intol of higher dose narc)   Elevate affected area. Warm moist compresses for 20 minutes every 2 hours while awake. Take antibiotics as directed and take acetaminophen as needed. To be seen in 48-72 hours if no improvement, sooner if worse.  Problem # 2:  HYPERTENSION (ICD-401.9)  Her updated medication list for this problem includes:    Metoprolol Tartrate 25 Mg Tabs (Metoprolol tartrate) .Marland Kitchen... Take 1 two times a day    Losartan Potassium 100 Mg Tabs (Losartan potassium) .Marland Kitchen... 1 by mouth once daily  Orders: Wound Care Center Referral (Wound Care) Prescription Created Electronically [phone]) LE Arterial Doppler/ABI (Le arterial doppler)  amlopdine prev caused fatigue---though suspect more likely related to Bbloc - but given PAF hosp 02/2009 - leave  low dose BBloc and changed amlodipine to ARB avoid diuretic due to tendency towards dehydration -  BP today: 152/72 Prior BP: 172/80 (12/14/2009)  Labs Reviewed: K+: 4.2 (03/25/2009) Creat: : 0.81 (03/25/2009)   Chol: 208 (03/20/2009)   HDL: 49 (03/20/2009)   LDL: 122 (03/20/2009)   TG: 184 (03/20/2009)  Problem # 3:  HYPERLIPIDEMIA (ICD-272.4)  Orders: Wound Care Center Referral (Wound Care) Prescription Created Electronically [phone]) LE Arterial Doppler/ABI (Le arterial doppler)  on fish oil, no statins  Labs Reviewed: SGOT: 33 (03/19/2009)   SGPT: 17 (03/19/2009)   HDL:49 (03/20/2009), 64.90 (01/04/2009)  LDL:122 (03/20/2009), 68 (21/30/8657)  Chol:208 (03/20/2009), 151 (01/04/2009)  Trig:184 (03/20/2009), 89 (01/04/2009)  Problem # 4:  WOUND, LEG (ICD-891.0)  see  above under cellulitis   Orders: Wound Care Center Referral (Wound Care) Prescription Created Electronically [phone]) LE Arterial Doppler/ABI (Le arterial doppler)  Complete Medication List: 1)  Metoprolol Tartrate 25 Mg Tabs (Metoprolol tartrate) .... Take 1 two times a day 2)  Senokot 8.6 Mg Tabs (Sennosides) .... Take 1 by mouth two times a day - hold for loose bowels 3)  Aspirin 325 Mg Tabs (Aspirin) .... Once daily 4)  Calcium Carbonate 600 Mg Tabs (Calcium carbonate) .... Once daily 5)  Vicodin 5-500 Mg Tabs (Hydrocodone-acetaminophen) .... Take 1/2 - 1 tab by mouth two times a day 6)  Vitamin C 500 Mg Tabs (Ascorbic acid) .... Once daily 7)  Centrum Silver Tabs (Multiple vitamins-minerals) .... Once daily 8)  Fish Oil 300 Mg Caps (Omega-3 fatty acids) .Marland Kitchen.. 1 by mouth bid 9)  Losartan Potassium 100 Mg Tabs (Losartan potassium) .Marland Kitchen.. 1 by mouth once daily 10)  Bactroban 2 % Oint (Mupirocin) .... Apply two times a day to affected skin with dressing change 11)  Levaquin 500 Mg Tabs (Levofloxacin) .Marland Kitchen.. 1 by mouth once daily x 10d 12)  Neurontin 300 Mg Caps (Gabapentin) .Marland Kitchen.. 1 by mouth two times a day (for  pain)  Patient Instructions: 1)  it was good to see you today. 2)  new antibiotics (levaquin) and pain pills your prescriptions have been electronically submitted to your pharmacy. Please take as directed. Contact our office if you believe you're having problems with the medication(s). 3)  use hydrocodone and neuronitn for pain as discussed 4)  we'll make referral for wound care evaluation and blood flow testing (ABI). Our office will contact you regarding these appointments once made.  5)  until then, continue dressing changes as ongoing 6)  Please schedule a follow-up appointment in 2-4 weeks to reevaluate wound, call sooner if problems.  Prescriptions: NEURONTIN 300 MG CAPS (GABAPENTIN) 1 by mouth two times a day (for pain)  #60 x 1   Entered and Authorized by:   Newt Lukes MD   Signed by:   Newt Lukes MD on 12/28/2009   Method used:   Electronically to        [facility] [address] [phone]* (retail)       [address] [place], [place]  [address]       Ph: [phone]       Fax: [phone]   RxID:   [id number] LEVAQUIN 500 MG TABS (LEVOFLOXACIN) 1 by mouth once daily x 10d  #10 x 0   Entered and Authorized by:   Newt Lukes MD   Signed by:   Newt Lukes MD on 12/28/2009   Method used:   Electronically to        [facility] [address] [phone]* (retail)       [address] [place], [place]  [address]       Ph: [phone]       Fax: [phone]   RxID:   [id number]

## 2010-04-25 NOTE — Assessment & Plan Note (Addendum)
Summary: npv/pvd   Visit Type:  new PV pt visit Primary Provider:  Newt Lukes MD  CC:  Left foot wound .  History of Present Illness: 77 yo WF with history of HTN and hyperlipidemia who is referred today as a new patient for evaluation of her PV disease. She has not known about PAD in the past. She dropped a log onto her left ankle/foot in July and had a bruise. This developed into an open ulcer/wound over the dorsal aspect of the left foot over the next two weeks. Since then she has been seen by Dr. Felicity Coyer for her wound. Non-invasive studies last week with evidence of severe arterial disease in the left leg (see below) and at least moderate disease in the right leg. She has been on antibiotics for the last week. Cellulitis has improved some. She has no fever, chills. She has actually had no claudication in either leg. Her left foot has been painful since she has had the wound. No cramping in the calf muscle. She is very active and cooks/cleans/drives. She has been scheduled to be seen in the wound clinic tomorrow.   Current Medications (verified): 1)  Metoprolol Tartrate 25 Mg Tabs (Metoprolol Tartrate) .... Take 1 Two Times A Day 2)  Senokot 8.6 Mg Tabs (Sennosides) .... Take 1 By Mouth Two Times A Day - Hold For Loose Bowels 3)  Aspirin 325 Mg Tabs (Aspirin) .... Once Daily 4)  Calcium Carbonate 600 Mg Tabs (Calcium Carbonate) .... Once Daily 5)  Vicodin 5-500 Mg Tabs (Hydrocodone-Acetaminophen) .... Take 1/2 - 1 Tab By Mouth Two Times A Day 6)  Vitamin C 500 Mg Tabs (Ascorbic Acid) .... Once Daily 7)  Centrum Silver  Tabs (Multiple Vitamins-Minerals) .... Once Daily 8)  Fish Oil 300 Mg Caps (Omega-3 Fatty Acids) .Marland Kitchen.. 1 Cap Once Daily 9)  Losartan Potassium 100 Mg Tabs (Losartan Potassium) .Marland Kitchen.. 1 By Mouth Once Daily 10)  Bactroban 2 % Oint (Mupirocin) .... Apply Two Times A Day To Affected Skin With Dressing Change 11)  Levaquin 500 Mg Tabs (Levofloxacin) .Marland Kitchen.. 1 By Mouth Once Daily  X 10d 12)  Neurontin 300 Mg Caps (Gabapentin) .Marland Kitchen.. 1 By Mouth Two Times A Day (For Pain)  Allergies: 1)  ! * Antibiotics 2)  ! * Cholestrol Meds  Past History:  Past Medical History: Hyperlipidemia Hypertension  PAF - spont conv NSR 02/2009 hosp fibromyalgia Nonconstricting schatzki ring - EGD 02/2009 Abnormal CT abdomen: cystic area in pancreatic head (02/2009) hx hyperparathroid, s/p surg in 1980s PAD CVA 2007    MD roster: rheum Kellie Simmering  GI - [facility] E[facility] -[facility]    Past Surgical History: Appendectomy (0454) Hysterectomy (0981'X) Stroke (2007) - right side residual pain parathyroidectomy - 1980s  Family History: Reviewed history from 04/01/2009 and no changes required. Family History Breast cancer 1st degree relative <50 (parent) Family History of Prostate CA 1st degree relative <50 (parent)  Social History: Reviewed history from 04/01/2009 and no changes required. Never Smoked no alcohol widowed, lives alone in her home in [place] supportive family nearby [place] and independant with ADLs including driving, cooking, bathing, dressing, etc  Review of Systems  The patient denies fatigue, malaise, fever, weight gain/loss, vision loss, decreased hearing, hoarseness, chest pain, palpitations, shortness of breath, prolonged cough, wheezing, sleep apnea, coughing up blood, abdominal pain, blood in stool, nausea, vomiting, diarrhea, heartburn, incontinence, blood in urine, muscle weakness, joint pain, leg swelling, rash, skin lesions, headache, fainting, dizziness, depression, anxiety, enlarged  lymph nodes, easy bruising or bleeding, and environmental allergies.         Left foot wound.  Vital Signs:  Patient profile:   77 year old female Height:      65 inches Weight:      116.8 pounds BMI:     19.51 Pulse rate:   51 / minute Pulse rhythm:   irregular BP sitting:   210 / 80  (left arm) Cuff size:   regular  Vitals Entered By: Danielle Rankin, CMA (January 03, 2010 12:34 PM)  Physical Exam  General:  General: Well developed, well nourished, NAD HEENT: OP clear, mucus membranes moist SKIN: warm, dry Neuro: No focal deficits Musculoskeletal: Muscle strength 5/5 all ext Psychiatric: Mood and affect normal Neck: No JVD, no carotid bruits, no thyromegaly, no lymphadenopathy. Lungs:Clear bilaterally, no wheezes, rhonci, crackles CV: RRR no murmurs, gallops rubs Abdomen: soft, NT, ND, BS present Extremities:Two 1x1 cm ulcerations over the proximal aspect of the dorsum of the left foot. Surrounding erythema. Granulation tissue present over wounds. No edema bilateral LE. Both feet are warm to touch. Sensation intact both feet. No palpalbe pedal pulses either foot.     Arterial Doppler  Procedure date:  12/28/2009  Findings:      Right ABI 0.73, TBI 0.52. Short focal stenosis right proximal SFA. Severe stenosis proximal popliteal. Bilateral ATA and PTA waveforms monophasic.   Left ABI 0.56, TBI 0.19. Severe stenosis proximal left SFA and distal SFA.   EKG  Procedure date:  01/03/2010  Findings:      Sinus bradycardia, rate 51 Poor R wave progression through the precordial leads.   Impression & Recommendations:  Problem # 1:  UNSPECIFIED PERIPHERAL VASCULAR DISEASE (ICD-443.9) Pamela [name] has a slowly healing wound over the dorsum of her left foot. She has been found by recent non-invasive imaging to have severe disease in the left leg with a proximal and distal superficial femoral artery stenosis. There is likely at least moderate disease in the below knee vessels. She is at risk of tissue loss at this point with a slowly healing wound on the left foot. I have discussed performing a distal aortogram with bilateral lower extremity runoff. She does not wish to pursue an invasive procedure at this point. I have reviewed the risks of the wound and the possibility that it may not heal unless adequate blood flow is restored. She is adamant that she  does not wish to have the angiogram this week. I will have her follow up in the Wound Care Center tomorrow for local wound care. She will come back to our office next week for repeat evaluation. If her wound is not healing well by then, will try again to talk her into the angiogram with possible PTA.   Problem # 2:  HYPERTENSION (ICD-401.9) BP elevated today. No headache, visual changes. She has not taken her Losartan today. I will have her take this medication when she gets home. I will also start Norvasc 5 mg by mouth Qdaily. She will have BP check by home health RN on Thursday.   Her updated medication list for this problem includes:    Metoprolol Tartrate 25 Mg Tabs (Metoprolol tartrate) .Marland Kitchen... Take 1 two times a day    Aspirin 325 Mg Tabs (Aspirin) ..... Once daily    Losartan Potassium 100 Mg Tabs (Losartan potassium) .Marland Kitchen... 1 by mouth once daily    Amlodipine Besylate 5 Mg Tabs (Amlodipine besylate) .Marland Kitchen... Take one tablet by mouth  daily  Other Orders: EKG w/ Interpretation (93000)  Patient Instructions: 1)  Your physician recommends that you schedule a follow-up appointment on Wednesday Oct. 19th, 2011.  2)  Your physician has recommended you make the following change in your medication: START NORVASC 5 mg by mouth daily for your blood pressure. Prescriptions: AMLODIPINE BESYLATE 5 MG TABS (AMLODIPINE BESYLATE) Take one tablet by mouth daily  #30 x 3   Entered by:   Whitney Maeola Sarah RN   Authorized by:   Verne Carrow, MD   Signed by:   Ellender Hose RN on 01/03/2010   Method used:   Electronically to        [facility] [address] [phone]* (retail)       [address] [place], [place]  [address]       Ph: [phone]       Fax: [phone]   RxID:   [id number]

## 2010-04-25 NOTE — Miscellaneous (Addendum)
Summary: Care Plans/[facility]  Care Plans/[facility]   Imported By: Sherian Rein 12/29/2009 11:43:03  _____________________________________________________________________  External Attachment:    Type:   Image     Comment:   External Document

## 2010-04-25 NOTE — Letter (Addendum)
Summary: BP Readings/Patient  BP Readings/Patient   Imported By: Sherian Rein 07/22/2009 07:36:27  _____________________________________________________________________  External Attachment:    Type:   Image     Comment:   External Document

## 2010-04-25 NOTE — Assessment & Plan Note (Addendum)
Summary: ck bp med/#/cd   Vital Signs:  Patient profile:   77 year old female Height:      65 inches (165.10 cm) Weight:      110.8 pounds (50.36 kg) BMI:     18.50 O2 Sat:      97 % on Room air Temp:     97.7 degrees F (36.50 degrees C) oral Pulse rate:   58 / minute BP sitting:   160 / 72  (left arm) Cuff size:   regular  Vitals Entered By: Orlan Leavens (Aug 15, 2009 1:17 PM)  O2 Flow:  Room air CC: follow-up visit Is Patient Diabetic? No Pain Assessment Patient in pain? no        Primary Care Provider:  Newt Lukes MD  CC:  follow-up visit.  History of Present Illness: here for HTN f/u - home log reviewed - SBP  125-160 , HR  50-70 reports compliance with ongoing medical treatment and no changes in medication dose or frequency. denies adverse side effects related to current therapy.   Clinical Review Panels:  Complete Metabolic Panel   Glucose:  92 (03/25/2009)   Sodium:  140 (03/25/2009)   Potassium:  4.2 (03/25/2009)   Chloride:  103 (03/25/2009)   CO2:  32 (03/25/2009)   BUN:  16 (03/25/2009)   Creatinine:  0.81 (03/25/2009)   Albumin:  3.9 (03/19/2009)   Total Protein:  7.0 (03/19/2009)   Calcium:  8.9 (03/25/2009)   Total Bili:  0.6 (03/19/2009)   Alk Phos:  86 (03/19/2009)   SGPT (ALT):  17 (03/19/2009)   SGOT (AST):  33 (03/19/2009)   Current Medications (verified): 1)  Metoprolol Tartrate 25 Mg Tabs (Metoprolol Tartrate) .... Take 1 Two Times A Day 2)  Senokot 8.6 Mg Tabs (Sennosides) .... Take 1 By Mouth Two Times A Day - Hold For Loose Bowels 3)  Aspirin 325 Mg Tabs (Aspirin) .... Once Daily 4)  Calcium Carbonate 600 Mg Tabs (Calcium Carbonate) .... Once Daily 5)  Vicodin 5-500 Mg Tabs (Hydrocodone-Acetaminophen) .... Take 1/2 - 1 Tab By Mouth Two Times A Day 6)  Vitamin C 500 Mg Tabs (Ascorbic Acid) .... Once Daily 7)  Centrum Silver  Tabs (Multiple Vitamins-Minerals) .... Once Daily 8)  Fish Oil 300 Mg Caps (Omega-3 Fatty Acids) .Marland Kitchen..  1 By Mouth Bid 9)  Claritin 10 Mg Tabs (Loratadine) .Marland Kitchen.. 1 By Mouth Once Daily 10)  Miralax  Powd (Polyethylene Glycol 3350) .Marland Kitchen.. 17g (1 Scoop) in [employer] Once Daily As Needed For Constipation (In Addition To Sennokot) 11)  Amlodipine Besylate 5 Mg Tabs (Amlodipine Besylate) .Marland Kitchen.. 1 By Mouth Once Daily  Allergies (verified): 1)  ! * Antibiotics 2)  ! * Cholestrol Meds  Past History:  Past Medical History: Hyperlipidemia Hypertension PAF - spont conv NSR 02/2009 hosp fibromyalgia Noncontricting schatzki ring per EGD 02/2009 Abnormal CT abdomen: cystic area in pancreatic head (02/2009) hx hyperparathroid, s/p surg in 1980s  MD rooster: rheum - [facility]  GI - [facility] E[facility] -[facility]    Review of Systems  The patient denies fever, chest pain, and syncope.    Physical Exam  General:  spry, alert, well-developed, well-nourished, and cooperative to examination.   niece [name] at side Lungs:  normal respiratory effort, no intercostal retractions or use of accessory muscles; normal breath sounds bilaterally - no crackles and no wheezes.    Heart:  normal rate, regular rhythm, no murmur, and no rub. BLE without edema.  Impression & Recommendations:  Problem # 1:  HYPERTENSION (ICD-401.9)  pt feels amlodiine causing fatigue--- suspect more likely related to Bbloc - but given PAF hosp 02/2009 - leave low dose BBloc and change amlodipine to ARB avoid diuretic due to tendency towards dehydration - reck 6 weeks, sooner if prob Her updated medication list for this problem includes:    Metoprolol Tartrate 25 Mg Tabs (Metoprolol tartrate) .Marland Kitchen... Take 1 two times a day    Losartan Potassium 100 Mg Tabs (Losartan potassium) .Marland Kitchen... 1 by mouth once daily  BP today: 160/72 Prior BP: 186/72 (07/11/2009)  Labs Reviewed: K+: 4.2 (03/25/2009) Creat: : 0.81 (03/25/2009)   Chol: 208 (03/20/2009)   HDL: 49 (03/20/2009)   LDL: 122 (03/20/2009)   TG: 184 (03/20/2009) Time spent with  patient/family 25 minutes, more than 50% of this time was spent counseling patient onhypertension  Orders: Prescription Created Electronically [phone])  Complete Medication List: 1)  Metoprolol Tartrate 25 Mg Tabs (Metoprolol tartrate) .... Take 1 two times a day 2)  Senokot 8.6 Mg Tabs (Sennosides) .... Take 1 by mouth two times a day - hold for loose bowels 3)  Aspirin 325 Mg Tabs (Aspirin) .... Once daily 4)  Calcium Carbonate 600 Mg Tabs (Calcium carbonate) .... Once daily 5)  Vicodin 5-500 Mg Tabs (Hydrocodone-acetaminophen) .... Take 1/2 - 1 tab by mouth two times a day 6)  Vitamin C 500 Mg Tabs (Ascorbic acid) .... Once daily 7)  Centrum Silver Tabs (Multiple vitamins-minerals) .... Once daily 8)  Fish Oil 300 Mg Caps (Omega-3 fatty acids) .Marland Kitchen.. 1 by mouth bid 9)  Claritin 10 Mg Tabs (Loratadine) .Marland Kitchen.. 1 by mouth once daily 10)  Miralax Powd (Polyethylene glycol 3350) .Marland Kitchen.. 17g (1 scoop) in 8oz water once daily as needed for constipation (in addition to sennokot) 11)  Losartan Potassium 100 Mg Tabs (Losartan potassium) .Marland Kitchen.. 1 by mouth once daily  Patient Instructions: 1)  .it was good to see you today. 2)  change amlodipine to losartan for blood pressure control 3)  If SBP still 160 or higher, call for addition medication to help control blood pressure 4)  If you need a referral for hearing check, please let us know 5)  Please keep follow-up appointment as previously scheduled, sooner if problems.  Prescriptions: LOSARTAN POTASSIUM 100 MG TABS (LOSARTAN POTASSIUM) 1 by mouth once daily  #30 x 5   Entered and Authorized by:   Newt Lukes MD   Signed by:   Newt Lukes MD on 08/15/2009   Method used:   Electronically to        [facility]. [address] [phone]* (retail)       [address] [place], [place]  [address]       Ph: [phone]       Fax: [phone]   RxID:   [id number]

## 2010-04-25 NOTE — Progress Notes (Addendum)
Summary: BP readings  Phone Note Call from Patient   Caller: Great-Niece Dorcas Mcmurray [phone] Summary of Call: [name] called stating that pt's BP is highest after any activity but she does not have any other symptoms.  *please see BP readings* Initial call taken by: Margaret Pyle, CMA,  July 20, 2009 11:19 AM  Follow-up for Phone Call        home BP reviewed - overall SBP 150s - pulse 60s - will add amlodipine 5mg  once daily - e-rx done - pt needs OV in 2-4 weeks to re-eval and f/u same - thanks Follow-up by: Newt Lukes MD,  July 20, 2009 11:38 AM  Additional Follow-up for Phone Call Additional follow up Details #1::        pt's great-niece informed via VM, told sch f/u in 2-4 wks Additional Follow-up by: Margaret Pyle, CMA,  July 20, 2009 11:51 AM    New/Updated Medications: AMLODIPINE BESYLATE 5 MG TABS (AMLODIPINE BESYLATE) 1 by mouth once daily Prescriptions: AMLODIPINE BESYLATE 5 MG TABS (AMLODIPINE BESYLATE) 1 by mouth once daily  #30 x 2   Entered and Authorized by:   Newt Lukes MD   Signed by:   Newt Lukes MD on 07/20/2009   Method used:   Electronically to        [facility]. [address] [phone]* (retail)       [address] [place], [place]  [address]       Ph: [phone]       Fax: [phone]   RxID:   [id number]

## 2010-04-25 NOTE — Progress Notes (Addendum)
Summary: ??regarding advil   Phone Note Call from Patient Call back at [phone]   Caller: [name] Ellis-niece  Call For: Dr. Para March  Summary of Call: Patient was seen at the wound care center and was told to start taking advil. Neice was told to call to maker sure she could take the advil with the tramadol that she is already taking or if she needs to stop the tramadol and only take the advil. Please advise.  Initial call taken by: Melody Comas,  February 22, 2010 4:14 PM  Follow-up for Phone Call        It would be okay to take them together.  Take the advil with food.   Follow-up by: Crawford Givens MD,  February 22, 2010 4:57 PM  Additional Follow-up for Phone Call Additional follow up Details #1::        Niece advised. Additional Follow-up by: Delilah Shan CMA Duncan Dull),  February 22, 2010 5:05 PM

## 2010-04-25 NOTE — Miscellaneous (Addendum)
Summary: Orders Update  Clinical Lists Changes  Orders: Added new Test order of Arterial Duplex Lower Extremity (Arterial Duplex Low) - Signed 

## 2010-04-25 NOTE — Miscellaneous (Addendum)
Summary: Orders/[facility]  Orders/[facility]   Imported By: Sherian Rein 01/14/2010 08:40:32  _____________________________________________________________________  External Attachment:    Type:   Image     Comment:   External Document

## 2010-04-25 NOTE — Miscellaneous (Addendum)
Summary: Orders/[facility]  Orders/[facility]   Imported By: Lester Manhattan 12/26/2009 07:55:32  _____________________________________________________________________  External Attachment:    Type:   Image     Comment:   External Document

## 2010-04-25 NOTE — Assessment & Plan Note (Addendum)
Summary: f/u appt/#?cd   Vital Signs:  Patient profile:   77 year old female Height:      65 inches (165.10 cm) Weight:      112.8 pounds (51.27 kg) O2 Sat:      97 % on Room air Temp:     98.1 degrees F (36.72 degrees C) oral Pulse rate:   58 / minute BP sitting:   186 / 72  (left arm) Cuff size:   regular  Vitals Entered By: Orlan Leavens (July 11, 2009 1:27 PM)  O2 Flow:  Room air CC: 3 month follow-up Is Patient Diabetic? No Pain Assessment Patient in pain? no        Primary Care Provider:  Newt Lukes MD  CC:  3 month follow-up.  History of Present Illness: here for 3 month f/u  hosp at [facility] 12/25-12/31, 2010 for LLQ abd pain - experienced PAF and had throrough GI eval  1)PAF - concverted back to NSR spont before dc - now on new BBloc -  denies problems with med - no CP or palp or SOB  2) fibromyalgia - chroinc pain in neck and shoulder/arm, predom right side follows with dr. Kellie Simmering -  takes 1/2 tab vicodin with good releif of pain symptoms   3) hx CVA affecting right upper body, head, neck and RUE -  no weakness or numbness but +parasthesia and pain that "come and go" -  also complete loss of hearing on right side due to CVA no new weakness or  other chroinc deficits   4) HTN - has been running high x 2 weeks since taking claritin D  for allg- no CP, Ha or recurrent CVA/TIA symptoms  - no vision chnages - allg better since taking OTC meds  Current Medications (verified): 1)  Metoprolol Tartrate 25 Mg Tabs (Metoprolol Tartrate) .... Take 1 Two Times A Day 2)  Senokot 8.6 Mg Tabs (Sennosides) .... Take 1 By Mouth Two Times A Day - Hold For Loose Bowels 3)  Aspirin 325 Mg Tabs (Aspirin) .... Once Daily 4)  Calcium Carbonate 600 Mg Tabs (Calcium Carbonate) .... Once Daily 5)  Vicodin 5-500 Mg Tabs (Hydrocodone-Acetaminophen) .... Take 1/2 By Mouth Once Daily 6)  Vitamin C 500 Mg Tabs (Ascorbic Acid) .... Once Daily 7)  Centrum Silver  Tabs (Multiple  Vitamins-Minerals) .... Once Daily 8)  Fish Oil 300 Mg Caps (Omega-3 Fatty Acids) .Marland Kitchen.. 1 By Mouth Bid 9)  Claritin-D 24 Hour 10-240 Mg Xr24h-Tab (Loratadine-Pseudoephedrine) .... Take 1 By Mouth Qd  Allergies (verified): 1)  ! * Antibiotics 2)  ! * Cholestrol Meds  Past History:  Past Medical History: Hyperlipidemia Hypertension PAF - spont conv NSR 02/2009 hosp fibromyalgia Noncontricting schatzki ring per EGD 02/2009 Abnormal CT abdomen: cystic area in pancreatic head (02/2009) hx hyperparathroid, s/p surg in 1980s  MD rooster: rheum - [place]  GI - [facility] -[place] ent  Review of Systems  The patient denies chest pain, syncope, dyspnea on exertion, peripheral edema, and headaches.    Physical Exam  General:  spry, alert, well-developed, well-nourished, and cooperative to examination.   niece [name] at side Ears:  normal pinnae bilaterally, without erythema, swelling, or tenderness to palpation. TMs clear, without effusion, or cerumen impaction. Hearing diminished on left (with hearing aide); deaf on right Lungs:  normal respiratory effort, no intercostal retractions or use of accessory muscles; normal breath sounds bilaterally - no crackles and no wheezes.    Heart:  normal rate,  regular rhythm, no murmur, and no rub. BLE without edema. normal DP pulses and normal cap refill in all 4 extremities    Neurologic:  alert & oriented X3 and cranial nerves II-XII symetrically intact.  strength normal in all extremities, sensation intact to light touch, and gait normal. speech fluent without dysarthria or aphasia; follows commands with good comprehension.    Impression & Recommendations:  Problem # 1:  HYPERTENSION (ICD-401.9)  now uncontrolled, ?exac by decongest in allg med - rec changing to plain claritin and monitor at home - as prior BP well controlled, will not change med regimen at this time pt to monitor and call if still sbp>160 Her updated medication list for this  problem includes:    Metoprolol Tartrate 25 Mg Tabs (Metoprolol tartrate) .Marland Kitchen... Take 1 two times a day  BP today: 186/72 Prior BP: 130/72 (04/01/2009)  Labs Reviewed: K+: 4.2 (03/25/2009) Creat: : 0.81 (03/25/2009)   Chol: 208 (03/20/2009)   HDL: 49 (03/20/2009)   LDL: 122 (03/20/2009)   TG: 184 (03/20/2009)  Problem # 2:  CONSTIPATION (ICD-564.00)  ok to cont senna and use miralax if needed Her updated medication list for this problem includes:    Senokot 8.6 Mg Tabs (Sennosides) .Marland Kitchen... Take 1 by mouth two times a day - hold for loose bowels    Miralax Powd (Polyethylene glycol 3350) .Marland KitchenMarland KitchenMarland KitchenMarland Kitchen 17g (1 scoop) in 8oz water once daily as needed for constipation (in addition to sennokot)  Discussed dietary fiber measures and increased water intake.   Problem # 3:  FIBROMYALGIA (ICD-729.1) will refill vicodin as reports has been released from care of rheum who prev rx'd same -  use and Se reviewed today - likely exac constipation symptoms  Her updated medication list for this problem includes:    Aspirin 325 Mg Tabs (Aspirin) ..... Once daily    Vicodin 5-500 Mg Tabs (Hydrocodone-acetaminophen) .Marland Kitchen... Take 1/2 - 1 tab by mouth two times a day  Problem # 4:  ALLERGIC RHINITIS (ICD-477.9)  see above discussion re: use of plain claritin vs claritin d Her updated medication list for this problem includes:    Claritin 10 Mg Tabs (Loratadine) .Marland Kitchen... 1 by mouth once daily  Discussed use of allergy medications and environmental measures.   Time spent with patient/ neice 25 minutes, more than 50% of this time was spent counseling patient on OTC options for allergy and constipation tx, need to watch and adjust med for BP and referral to audiology for hearing test review as needed (pt declines) - also refill on narc meds as no longer being managed actively by rheum  Complete Medication List: 1)  Metoprolol Tartrate 25 Mg Tabs (Metoprolol tartrate) .... Take 1 two times a day 2)  Senokot 8.6 Mg Tabs  (Sennosides) .... Take 1 by mouth two times a day - hold for loose bowels 3)  Aspirin 325 Mg Tabs (Aspirin) .... Once daily 4)  Calcium Carbonate 600 Mg Tabs (Calcium carbonate) .... Once daily 5)  Vicodin 5-500 Mg Tabs (Hydrocodone-acetaminophen) .... Take 1/2 - 1 tab by mouth two times a day 6)  Vitamin C 500 Mg Tabs (Ascorbic acid) .... Once daily 7)  Centrum Silver Tabs (Multiple vitamins-minerals) .... Once daily 8)  Fish Oil 300 Mg Caps (Omega-3 fatty acids) .Marland Kitchen.. 1 by mouth bid 9)  Claritin 10 Mg Tabs (Loratadine) .Marland Kitchen.. 1 by mouth once daily 10)  Miralax Powd (Polyethylene glycol 3350) .Marland Kitchen.. 17g (1 scoop) in 8oz water once daily as needed for  constipation (in addition to sennokot)  Patient Instructions: 1)  it was good to see you today. 2)  stop Claritin D and take only plain Claritin as needed for allergy symptoms  3)  If SBP still 160 or higher, call for addition medication to help control blood pressure 4)  May use Miralax in addition to Senokot as needed for bowels 5)  If you need a referral for hearing check, please let us know 6)  Please schedule a follow-up appointment in 3 months to monitor, sooner if problems.  Prescriptions: VICODIN 5-500 MG TABS (HYDROCODONE-ACETAMINOPHEN) take 1/2 - 1 tab by mouth two times a day  #60 x 5   Entered and Authorized by:   Newt Lukes MD   Signed by:   Newt Lukes MD on 07/11/2009   Method used:   Print then Give to Patient   RxID:   [id number]

## 2010-04-25 NOTE — Assessment & Plan Note (Addendum)
Summary: log rolled over on foot per niece in july nt sure which foot/...   Vital Signs:  Patient profile:   77 year old female Height:      65 inches (165.10 cm) Weight:      114.0 pounds (51.82 kg) O2 Sat:      95 % on Room air Temp:     97.5 degrees F (36.39 degrees C) oral Pulse rate:   95 / minute BP sitting:   172 / 80  (left arm) Cuff size:   regular  Vitals Entered By: Orlan Leavens RMA (December 14, 2009 1:15 PM)  O2 Flow:  Room air CC: (L) foot pain Is Patient Diabetic? No Pain Assessment Patient in pain? yes     Location: (L) foot Type: aching Onset of pain  daughter states a log rolled over foot back in July still having ongoing pain Comments Pt also c/o UTI   Primary Care Provider:  Newt Lukes MD  CC:  (L) foot pain.  History of Present Illness: here for left foot pain onset 3 days ago precipitated by injury to left ankle 2 months ago - log rolled onto foot, scratched skin pt has been soaking in salt water and using neosporin since that time however, progressive redness and pain in area of wound over last 3 days denies fever - no other skin wounds no difficulty walking  also review chronic med issues: 1) PAF - concverted back to NSR spont before hosp dc 02/2009- now on  BBloc - no known recurrence  denies problems with med - no fatigue or dizzy symptoms - no CP or palp or SOB  2) fibromyalgia - chroinc pain in neck and shoulder/arm, predom right side followed prev with dr. Kellie Simmering (rheum) for same -  for years, has taken occ 1/2 tab vicodin with good relief of pain symptoms   3) hx CVA affecting right upper body, head, neck and RUE -  no weakness or numbness but +parasthesia and pain that "come and go" -  also complete loss of hearing on right side due to CVA no new weakness or  other chroinc deficits   4) HTN - prev high due to claritin D for allg- no CP, Ha or recurrent CVA/TIA symptoms  - no vision changes - home log BP readings reviewed  -  SBP 130s-140s -reports compliance with ongoing medical treatment and no changes in medication dose or frequency. denies adverse side effects related to current therapy.   5) GERD - occ symptoms rlated to foods - no abd pain - use tums as needed, less than 1/week   Current Medications (verified): 1)  Metoprolol Tartrate 25 Mg Tabs (Metoprolol Tartrate) .... Take 1 Two Times A Day 2)  Senokot 8.6 Mg Tabs (Sennosides) .... Take 1 By Mouth Two Times A Day - Hold For Loose Bowels 3)  Aspirin 325 Mg Tabs (Aspirin) .... Once Daily 4)  Calcium Carbonate 600 Mg Tabs (Calcium Carbonate) .... Once Daily 5)  Vicodin 5-500 Mg Tabs (Hydrocodone-Acetaminophen) .... Take 1/2 - 1 Tab By Mouth Two Times A Day 6)  Vitamin C 500 Mg Tabs (Ascorbic Acid) .... Once Daily 7)  Centrum Silver  Tabs (Multiple Vitamins-Minerals) .... Once Daily 8)  Fish Oil 300 Mg Caps (Omega-3 Fatty Acids) .Marland Kitchen.. 1 By Mouth Bid 9)  Losartan Potassium 100 Mg Tabs (Losartan Potassium) .Marland Kitchen.. 1 By Mouth Once Daily  Allergies (verified): 1)  ! * Antibiotics 2)  ! * Cholestrol Meds  Past  History:  Past Medical History: Hyperlipidemia Hypertension  PAF - spont conv NSR 02/2009 hosp fibromyalgia Nonconstricting schatzki ring - EGD 02/2009 Abnormal CT abdomen: cystic area in pancreatic head (02/2009) hx hyperparathroid, s/p surg in 1980s  MD roster: rheum Kellie Simmering  GI - [facility] -[place] [facility]    Review of Systems  The patient denies fever, weight loss, syncope, headaches, difficulty walking, depression, abnormal bleeding, and enlarged lymph nodes.         c/o dysuria x 4 days  Physical Exam  General:  spry, alert, well-developed, well-nourished, and cooperative to examination.   niece [name] at side Skin:  left anterior ankle with mild cellulitis surrounding 2 sub cm shallow ulcerations - no active drainage - min edema Psych:  Oriented X3, memory intact for recent and remote, normally interactive, good eye contact, not  anxious appearing, not depressed appearing, and not agitated.      Impression & Recommendations:  Problem # 1:  CELLULITIS, FOOT, LEFT (ICD-682.7)  open shallow ulcerations from prior trauma - cellulitis changes noted, early - good cap refill so low suspecion for vbascular issue but consider need for ABI start emperic systemic and topical abx - start HHRN wound care (home bound as does not drive) recheck here 2 weeks, sooner if problems - same explained to pt/niece who agrees use hydrocodone for pain control as needed  Her updated medication list for this problem includes:    Sulfamethoxazole-tmp Ds 800-160 Mg Tabs (Sulfamethoxazole-trimethoprim) .Marland Kitchen... 1 by mouth two times a day x 7 days  Orders: Prescription Created Electronically [phone]) Home Health Referral [facility])  Elevate affected area. Warm moist compresses for 20 minutes every 2 hours while awake. Take antibiotics as directed and take acetaminophen as needed. To be seen in 48-72 hours if no improvement, sooner if worse.  Problem # 2:  UTI (ICD-599.0)  +Udip - covered w/ abx for above Her updated medication list for this problem includes:    Sulfamethoxazole-tmp Ds 800-160 Mg Tabs (Sulfamethoxazole-trimethoprim) .Marland Kitchen... 1 by mouth two times a day x 7 days  Orders: Prescription Created Electronically [phone]) UA Dipstick w/o Micro (manual) ([id number])  Encouraged to push clear liquids, get enough rest, and take acetaminophen as needed. To be seen in 10 days if no improvement, sooner if worse.  Complete Medication List: 1)  Metoprolol Tartrate 25 Mg Tabs (Metoprolol tartrate) .... Take 1 two times a day 2)  Senokot 8.6 Mg Tabs (Sennosides) .... Take 1 by mouth two times a day - hold for loose bowels 3)  Aspirin 325 Mg Tabs (Aspirin) .... Once daily 4)  Calcium Carbonate 600 Mg Tabs (Calcium carbonate) .... Once daily 5)  Vicodin 5-500 Mg Tabs (Hydrocodone-acetaminophen) .... Take 1/2 - 1 tab by mouth two times a day 6)   Vitamin C 500 Mg Tabs (Ascorbic acid) .... Once daily 7)  Centrum Silver Tabs (Multiple vitamins-minerals) .... Once daily 8)  Fish Oil 300 Mg Caps (Omega-3 fatty acids) .Marland Kitchen.. 1 by mouth bid 9)  Losartan Potassium 100 Mg Tabs (Losartan potassium) .Marland Kitchen.. 1 by mouth once daily 10)  Sulfamethoxazole-tmp Ds 800-160 Mg Tabs (Sulfamethoxazole-trimethoprim) .Marland Kitchen.. 1 by mouth two times a day x 7 days 11)  Bactroban 2 % Oint (Mupirocin) .... Apply two times a day to affected skin with dressing change  Patient Instructions: 1)  it was good to see you today. 2)  antibiotics - pills and ointment - your prescriptions have been electronically submitted to your pharmacy. Please take as directed. Contact our  office if you believe you're having problems with the medication(s). 3)  use hydrocodone for pain as discussed 4)  we'll make referral for home health nurse to help with wound care. Our office will contact you regarding this appointment once made.  5)  Please schedule a follow-up appointment in 2 weeks to reevaluate wound, call sooner if problems.  6)  urine shows UTI - this will also be treated with the antibioitcs for your ankle infection Prescriptions: BACTROBAN 2 % OINT (MUPIROCIN) apply two times a day to affected skin with dressing change  #1 x 0   Entered and Authorized by:   Newt Lukes MD   Signed by:   Newt Lukes MD on 12/14/2009   Method used:   Electronically to        [facility]. [address] [phone]* (retail)       [address] [place], [place]  [address]       Ph: [phone]       Fax: [phone]   RxID:   [id number] SULFAMETHOXAZOLE-TMP DS 800-160 MG TABS (SULFAMETHOXAZOLE-TRIMETHOPRIM) 1 by mouth two times a day x 7 days  #14 x 0   Entered and Authorized by:   Newt Lukes MD   Signed by:   Newt Lukes MD on 12/14/2009   Method used:   Electronically to        [facility]. [address] [phone]* (retail)       [address] [place],  [place]  [id number]       Ph: [phone]       Fax: [phone]   RxID:   [id number]   Laboratory Results   Urine Tests    Routine Urinalysis   Color: yellow Appearance: Clear Glucose: negative   (Normal Range: Negative) Bilirubin: negative   (Normal Range: Negative) Ketone: large (80)   (Normal Range: Negative) Spec. Gravity: 1.015   (Normal Range: 1.003-1.035) Blood: trace-intact   (Normal Range: Negative) pH: 5.0   (Normal Range: 5.0-8.0) Protein: negative   (Normal Range: Negative) Urobilinogen: 0.2   (Normal Range: 0-1) Nitrite: negative   (Normal Range: Negative) Leukocyte Esterace: moderate   (Normal Range: Negative)

## 2010-04-25 NOTE — Letter (Addendum)
[facility]   Imported By: Lester  02/06/2010 08:38:51  _____________________________________________________________________  External Attachment:    Type:   Image     Comment:   External Document

## 2010-04-25 NOTE — Progress Notes (Addendum)
Summary: critical distal left SFA stenosis on ABI - refer to cooper  ---- Converted from flag ---- ---- 12/28/2009 4:45 PM, Missy Al-Rammal, RVT, RDCS wrote: Right popliteal artery stenosis >50%. Critical distal left SFA stenosis. Rt ABI .73, left ABI .56.   Left toe-brachial index severely depressed, at risk for tissue loss....suggest consult Dr Elyn Peers ------------------------------  reviewed results- consult with dr. Excell Seltzer requested [facility] will notify pt of same also should keep appt with wound care center as prev referred today- i [facility] re: same [phone]) Newt Lukes MD  December 28, 2009 5:13 PM    Phone Note Outgoing Call    New Problems: UNSPECIFIED PERIPHERAL VASCULAR DISEASE (ICD-443.9)   New Problems: UNSPECIFIED PERIPHERAL VASCULAR DISEASE (ICD-443.9)

## 2010-04-27 NOTE — Letter (Addendum)
Summary: [facility]  [facility]   Imported By: Sherian Rein 03/09/2010 13:51:22  _____________________________________________________________________  External Attachment:    Type:   Image     Comment:   External Document

## 2010-04-27 NOTE — Progress Notes (Addendum)
Summary: needs order for mammogram  Phone Note Call from Patient   Caller: Patient Call For: Ruthe Mannan MD Summary of Call: Pt needs order for mammogram, she goes to [facility].  Her last one was 03/01/09. Initial call taken by: Lowella Petties CMA, AAMA,  April 12, 2010 2:52 PM  New Problems: OTHER SCREENING MAMMOGRAM (ICD-V76.12)   New Problems: OTHER SCREENING MAMMOGRAM (ICD-V76.12)

## 2010-04-27 NOTE — Letter (Addendum)
Summary: Discharged/[facility]  Discharged/[facility]   Imported By: Sherian Rein 03/24/2010 12:41:08  _____________________________________________________________________  External Attachment:    Type:   Image     Comment:   External Document

## 2010-05-04 ENCOUNTER — Encounter: Payer: Self-pay | Admitting: Family Medicine

## 2010-05-04 ENCOUNTER — Ambulatory Visit: Payer: Self-pay | Admitting: Family Medicine

## 2010-05-05 ENCOUNTER — Ambulatory Visit (INDEPENDENT_AMBULATORY_CARE_PROVIDER_SITE_OTHER): Payer: MEDICARE | Admitting: Family Medicine

## 2010-05-05 ENCOUNTER — Encounter: Payer: Self-pay | Admitting: Family Medicine

## 2010-05-05 DIAGNOSIS — I10 Essential (primary) hypertension: Secondary | ICD-10-CM

## 2010-05-08 ENCOUNTER — Encounter: Payer: Self-pay | Admitting: Family Medicine

## 2010-05-08 ENCOUNTER — Ambulatory Visit (INDEPENDENT_AMBULATORY_CARE_PROVIDER_SITE_OTHER): Payer: MEDICARE | Admitting: Family Medicine

## 2010-05-08 DIAGNOSIS — I10 Essential (primary) hypertension: Secondary | ICD-10-CM

## 2010-05-11 NOTE — Assessment & Plan Note (Addendum)
Summary: Check B/P   Vital Signs:  Patient profile:   77 year old female Weight:      117.25 pounds Temp:     97.6 degrees F oral Pulse rate:   64 / minute Pulse (ortho):   66 / minute Pulse rhythm:   regular BP sitting:   210 / 80  (left arm) BP standing:   196 / 76 Cuff size:   regular  Vitals Entered By: Selena Batten Dance CMA Duncan Dull) (May 05, 2010 4:10 PM)  Serial Vital Signs/Assessments:  Time      Position  BP       Pulse  Resp  Temp     By 5:14 PM   Lying LA  198/80   60                    Kim Dance CMA (AAMA) 5:14 PM   Sitting   198/80   60                    Kim Dance CMA (AAMA) 5:14 PM   Standing  196/76   66                    Kim Dance CMA (AAMA)           Lying RA  212/90                         Eustaquio Boyden  MD  CC: Elevated B/P Comments Patient denies HA, vision changes, dizziness, weakness   History of Present Illness: CC: elevated BP  77 yo with h/o HTN, PAF, PVD and PAD with h/o chronic LLE wound.  has had "bug" this week.  waking up with body aching.  noted acid reflux acting up more this week.  however has slept well.  good night last night.  checking BP at home, higher than normal this week.  Normally runs 140/60s.  yesterday 200s/80s.  Brings log of BP today - 198/77 -- 209/77 -- 211/74 pulse 68.  Came because worried and wanted BP cuff checked.  No HA, vision changes, chest pain or tightness, shortness of breath, confusion.  voiding fine.  No trouble speaking, no weakness in arms/legs.  No recent NSAID use or increase in salt.  h/o light stroke several years ago.  no residual weakness but does have residual paresthesias and pain that "comes and goes".  endorsing decreased grip strength R side going on for several weeks now.  h/o very high bp at previous offices over last year, last few visits here had been well controlled.  Current Medications (verified): 1)  Metoprolol Tartrate 25 Mg Tabs (Metoprolol Tartrate) .... Take 1 Two Times A Day 2)   Senokot 8.6 Mg Tabs (Sennosides) .... Take 1 By Mouth Two Times A Day - Hold For Loose Bowels 3)  Aspirin 325 Mg Tabs (Aspirin) .... Once Daily 4)  Calcium Carbonate 600 Mg Tabs (Calcium Carbonate) .... Once Daily 5)  Vitamin C 500 Mg Tabs (Ascorbic Acid) .... Once Daily 6)  Centrum Silver  Tabs (Multiple Vitamins-Minerals) .... Once Daily 7)  Fish Oil 300 Mg Caps (Omega-3 Fatty Acids) .Marland Kitchen.. 1 Cap Once Daily 8)  Losartan Potassium 100 Mg Tabs (Losartan Potassium) .Marland Kitchen.. 1 By Mouth Once Daily 9)  Amlodipine Besylate 5 Mg Tabs (Amlodipine Besylate) .... Take One Tablet By Mouth Daily  Allergies: 1)  ! * Antibiotics 2)  ! * Cholestrol Meds  Past History:  Past Medical History: Last updated: 01/03/2010 Hyperlipidemia Hypertension  PAF - spont conv NSR 02/2009 hosp fibromyalgia Nonconstricting schatzki ring - EGD 02/2009 Abnormal CT abdomen: cystic area in pancreatic head (02/2009) hx hyperparathroid, s/p surg in 1980s PAD CVA 2007    MD roster: rheum Kellie Simmering  GI - [facility] -[facility]    Social History: Last updated: 04/01/2009 Never Smoked no alcohol widowed, lives alone in her home in [place] supportive family nearby [place] and independant with ADLs including driving, cooking, bathing, dressing, etc  Review of Systems       per HPI  Physical Exam  General:  alert, well-developed, well-nourished, and cooperative to examination, nontoxic, conversant and pleasant Head:  normocephalic and atraumatic.   Eyes:  vision grossly intact and pupils equal.   Mouth:  MMM, no pharyngeal erythema Neck:  No deformities, masses, or tenderness noted.  no LAD, no bruits Lungs:  normal respiratory effort, no intercostal retractions or use of accessory muscles; normal breath sounds bilaterally - no crackles and no wheezes.    Heart:  normal rate, regular rhythm, no murmur, and no rub. Pulses:  2+ rad pulses Extremities:  no pedal edema Neurologic:  CN 2-12 intact, station and gait  intact, grip strength equal bilaterally, normal FTN, gait slow but steady, station intact   Impression & Recommendations:  Problem # 1:  HYPERTENSION (ICD-401.9) Assessment Deteriorated hypertensive urgency.  has been high in past.  anticipate PAD component as well as calcified vessels leading to elevated readings.  Pt doesn't seem to be symptomatic from [facility] standpoint today.  neurologically nonfocal.  increase amlodipine and f/u with PCP next week.  Discussed red flags to seek urgent care, advised to have family member keep eye on her over weekend.  Lives alone but has nephew neighbor and niece who calls her every day.  ? if viral process leading to hypertensive response given feeling this week under weather.    no significant change with orthostatics  due for blood work.   Her updated medication list for this problem includes:    Metoprolol Tartrate 25 Mg Tabs (Metoprolol tartrate) .Marland Kitchen... Take 1 two times a day    Losartan Potassium 100 Mg Tabs (Losartan potassium) .Marland Kitchen... 1 by mouth once daily    Amlodipine Besylate 10 Mg Tabs (Amlodipine besylate) .Marland Kitchen... Take one by mouth daily  BP today: 210/80 Prior BP: 124/72 (02/09/2010)  Labs Reviewed: K+: 4.2 (03/25/2009) Creat: : 0.81 (03/25/2009)   Chol: 208 (03/20/2009)   HDL: 49 (03/20/2009)   LDL: 122 (03/20/2009)   TG: 184 (03/20/2009)  Complete Medication List: 1)  Metoprolol Tartrate 25 Mg Tabs (Metoprolol tartrate) .... Take 1 two times a day 2)  Senokot 8.6 Mg Tabs (Sennosides) .... Take 1 by mouth two times a day - hold for loose bowels 3)  Aspirin 325 Mg Tabs (Aspirin) .... Once daily 4)  Calcium Carbonate 600 Mg Tabs (Calcium carbonate) .... Once daily 5)  Vitamin C 500 Mg Tabs (Ascorbic acid) .... Once daily 6)  Centrum Silver Tabs (Multiple vitamins-minerals) .... Once daily 7)  Fish Oil 300 Mg Caps (Omega-3 fatty acids) .Marland Kitchen.. 1 cap once daily 8)  Losartan Potassium 100 Mg Tabs (Losartan potassium) .Marland Kitchen.. 1 by mouth once daily 9)   Amlodipine Besylate 10 Mg Tabs (Amlodipine besylate) .... Take one by mouth daily  Patient Instructions: 1)  Return on monday for follow up with Dr. Dayton Martes. 2)  your blood pressure is high.  I think this is  due to hardened arteries (calcium in arteries) 3)  keep eye on it over weekend.   4)  increase norvasc to 2 pills of 5mg  daily in the morning until you run out.  new prescription will be for 1 pill of 10mg  daily. 5)  If any new headache, weakness, or feeling poorly worsens over weekend, please get checked out. 6)  get plenty of water to drink. Prescriptions: AMLODIPINE BESYLATE 10 MG TABS (AMLODIPINE BESYLATE) take one by mouth daily  #30 x 3   Entered and Authorized by:   Eustaquio Boyden  MD   Signed by:   Eustaquio Boyden  MD on 05/05/2010   Method used:   Electronically to        [facility]. [address] [phone]* (retail)       [address] [place], [place]  [id number]       Ph: [phone]       Fax: [phone]   RxID:   [id number]    Orders Added: 1)  Est. Patient Level IV [41660]    Current Allergies (reviewed today): ! * ANTIBIOTICS ! * CHOLESTROL MEDS

## 2010-05-17 NOTE — Assessment & Plan Note (Addendum)
Summary: Blood pressure concerns   Vital Signs:  Patient profile:   77 year old female Height:      65 inches Weight:      115.50 pounds Temp:     98.2 degrees F oral Pulse rate:   68 / minute Pulse rhythm:   regular BP sitting:   170 / 80  (left arm) Cuff size:   regular  Vitals Entered By: Linde Gillis CMA Duncan Dull) (May 08, 2010 12:18 PM)  Serial Vital Signs/Assessments:  Time      Position  BP       Pulse  Resp  Temp     By                     156/62                         Ruthe Mannan MD  CC: blood pressure concerns   History of Present Illness: follow up BP  77 yo with h/o HTN, PAF, PVD and PAD with h/o chronic LLE wound here for follow up BP.  saw Dr. Reece Agar 3 days ago.  came in because BP had been higher than normal on cuff at home and was very high here as well- 198/90-212/90.    No HA, vision changes, chest pain or tightness, shortness of breath, confusion.  voiding fine.  No trouble speaking, no weakness in arms/legs.   h/o light stroke several years ago.  no residual weakness but does have residual paresthesias and pain that "comes and goes".  increased amlodipine from 5 mg to 10 mg daily on Friday.  BPs have been better.  Also still taking her Losartan 100 mg daily adn Metoprolol 25 mg daily.  Brings in BP log- much improved ranging from 129-163/49-66 (mainly in 140s over 50s), pulse in 60s.   Current Medications (verified): 1)  Metoprolol Tartrate 25 Mg Tabs (Metoprolol Tartrate) .... Take 1 Two Times A Day 2)  Senokot 8.6 Mg Tabs (Sennosides) .... Take 1 By Mouth Two Times A Day - Hold For Loose Bowels 3)  Aspirin 325 Mg Tabs (Aspirin) .... Once Daily 4)  Calcium Carbonate 600 Mg Tabs (Calcium Carbonate) .... Once Daily 5)  Vitamin C 500 Mg Tabs (Ascorbic Acid) .... Once Daily 6)  Centrum Silver  Tabs (Multiple Vitamins-Minerals) .... Once Daily 7)  Fish Oil 300 Mg Caps (Omega-3 Fatty Acids) .Marland Kitchen.. 1 Cap Once Daily 8)  Losartan Potassium 100 Mg Tabs (Losartan  Potassium) .Marland Kitchen.. 1 By Mouth Once Daily 9)  Amlodipine Besylate 10 Mg Tabs (Amlodipine Besylate) .... Take One By Mouth Daily  Allergies: 1)  ! * Antibiotics 2)  ! * Cholestrol Meds  Past History:  Past Medical History: Last updated: 01/03/2010 Hyperlipidemia Hypertension  PAF - spont conv NSR 02/2009 hosp fibromyalgia Nonconstricting schatzki ring - EGD 02/2009 Abnormal CT abdomen: cystic area in pancreatic head (02/2009) hx hyperparathroid, s/p surg in 1980s PAD CVA 2007    MD roster: Justine Null Kellie Simmering  GI - [facility] -[place] [facility]    Past Surgical History: Last updated: 01/03/2010 Appendectomy (1940) Hysterectomy (1970's) Stroke (2007) - right side residual pain parathyroidectomy - 1980s  Family History: Last updated: 04/01/2009 Family History Breast cancer 1st degree relative <50 (parent) Family History of Prostate CA 1st degree relative <50 (parent)  Social History: Last updated: 04/01/2009 Never Smoked no alcohol widowed, lives alone in her home in [place] supportive family nearby [place] and independant with  ADLs including driving, cooking, bathing, dressing, etc  Risk Factors: Alcohol Use: 0 (04/01/2009) Exercise: yes (04/01/2009)  Risk Factors: Smoking Status: never (04/01/2009)  Review of Systems      See HPI General:  Denies malaise. Eyes:  Denies blurring. CV:  Denies chest pain or discomfort. Resp:  Denies shortness of breath. Neuro:  Denies memory loss and numbness.  Physical Exam  General:  alert, well-developed, well-nourished, and cooperative to examination, nontoxic, conversant and pleasant Neck:  No deformities, masses, or tenderness noted.  no LAD, no bruits Lungs:  normal respiratory effort, no intercostal retractions or use of accessory muscles; normal breath sounds bilaterally - no crackles and no wheezes.    Heart:  normal rate, regular rhythm, no murmur, and no rub. Extremities:  no pedal edema Neurologic:  CN 2-12 intact,  station and gait intact, grip strength equal bilaterally, normal FTN, gait slow but steady, station intact Psych:  Oriented X3, memory intact for recent and remote, normally interactive, good eye contact, not anxious appearing, not depressed appearing, and not agitated.      Impression & Recommendations:  Problem # 1:  HYPERTENSION (ICD-401.9) Assessment Improved Continue increased dose of amlodipine and follow up in 2 weeks. Pt still concerned that this may be related to another stroke.  Discussed work up options and she agrees at this point, imaging not necessary because she would not want to be very aggressive.  She would perhaps consider carotid dopplers if her symptoms worsen. Her updated medication list for this problem includes:    Metoprolol Tartrate 25 Mg Tabs (Metoprolol tartrate) .Marland Kitchen... Take 1 two times a day    Losartan Potassium 100 Mg Tabs (Losartan potassium) .Marland Kitchen... 1 by mouth once daily    Amlodipine Besylate 10 Mg Tabs (Amlodipine besylate) .Marland Kitchen... Take one by mouth daily  Complete Medication List: 1)  Metoprolol Tartrate 25 Mg Tabs (Metoprolol tartrate) .... Take 1 two times a day 2)  Senokot 8.6 Mg Tabs (Sennosides) .... Take 1 by mouth two times a day - hold for loose bowels 3)  Aspirin 325 Mg Tabs (Aspirin) .... Once daily 4)  Calcium Carbonate 600 Mg Tabs (Calcium carbonate) .... Once daily 5)  Vitamin C 500 Mg Tabs (Ascorbic acid) .... Once daily 6)  Centrum Silver Tabs (Multiple vitamins-minerals) .... Once daily 7)  Fish Oil 300 Mg Caps (Omega-3 fatty acids) .Marland Kitchen.. 1 cap once daily 8)  Losartan Potassium 100 Mg Tabs (Losartan potassium) .Marland Kitchen.. 1 by mouth once daily 9)  Amlodipine Besylate 10 Mg Tabs (Amlodipine besylate) .... Take one by mouth daily  Patient Instructions: 1)  Please follow up with me in 2 weeks. 2)  If symptoms do not get better, we will get an ultrasound of your neck vessels. Prescriptions: AMLODIPINE BESYLATE 10 MG TABS (AMLODIPINE BESYLATE) take one  by mouth daily  #30 x 3   Entered and Authorized by:   Ruthe Mannan MD   Signed by:   Ruthe Mannan MD on 05/08/2010   Method used:   Electronically to        [facility]. [address] [phone]* (retail)       [address] [place], [place]  [id number]       Ph: [phone]       Fax: [phone]   RxID:   [id number]    Orders Added: 1)  Est. Patient Level III [25956]    Current Allergies (reviewed today): ! * ANTIBIOTICS ! * CHOLESTROL  MEDS

## 2010-05-19 ENCOUNTER — Ambulatory Visit
Admission: RE | Admit: 2010-05-19 | Discharge: 2010-05-19 | Disposition: A | Discharge status: Home / Self Care | Payer: MEDICARE | Source: Ambulatory Visit | Attending: Family Medicine | Admitting: Family Medicine

## 2010-05-19 ENCOUNTER — Other Ambulatory Visit: Payer: Self-pay | Admitting: Family Medicine

## 2010-05-19 ENCOUNTER — Ambulatory Visit: Payer: MEDICARE | Admitting: Family Medicine

## 2010-05-19 ENCOUNTER — Ambulatory Visit (INDEPENDENT_AMBULATORY_CARE_PROVIDER_SITE_OTHER)
Admission: RE | Admit: 2010-05-19 | Discharge: 2010-05-19 | Disposition: A | Discharge status: Home / Self Care | Payer: MEDICARE | Source: Ambulatory Visit | Attending: Family Medicine | Admitting: Family Medicine

## 2010-05-19 ENCOUNTER — Encounter: Payer: Self-pay | Admitting: Family Medicine

## 2010-05-19 DIAGNOSIS — M25539 Pain in unspecified wrist: Secondary | ICD-10-CM

## 2010-05-19 DIAGNOSIS — R519 Headache, unspecified: Secondary | ICD-10-CM | POA: Insufficient documentation

## 2010-05-19 DIAGNOSIS — W19XXXA Unspecified fall, initial encounter: Secondary | ICD-10-CM

## 2010-05-19 DIAGNOSIS — R51 Headache: Secondary | ICD-10-CM

## 2010-05-22 ENCOUNTER — Encounter: Payer: Self-pay | Admitting: Family Medicine

## 2010-05-22 ENCOUNTER — Ambulatory Visit (INDEPENDENT_AMBULATORY_CARE_PROVIDER_SITE_OTHER): Payer: MEDICARE | Admitting: Family Medicine

## 2010-05-22 DIAGNOSIS — W19XXXA Unspecified fall, initial encounter: Secondary | ICD-10-CM

## 2010-05-22 DIAGNOSIS — I498 Other specified cardiac arrhythmias: Secondary | ICD-10-CM

## 2010-05-22 DIAGNOSIS — M25539 Pain in unspecified wrist: Secondary | ICD-10-CM

## 2010-05-23 NOTE — Assessment & Plan Note (Addendum)
Summary: Fall   Vital Signs:  Patient profile:   77 year old female Height:      65 inches Weight:      115 pounds BMI:     19.21 Pulse rate:   60 / minute Pulse rhythm:   regular BP sitting:   140 / 64  (left arm) Cuff size:   regular  Vitals Entered By: Linde Gillis CMA Duncan Dull) (May 19, 2010 4:58 PM) CC: patient fell in the parking lot at [employer]   History of Present Illness: 77 year old female weith h of PAF, HTN, hx of CVA presents after falling at 4 PM  today in [facility] parking lot.  She lost balance when came out of [employer].. it had just started raining heavily. Hit right knee, caught herself with her right hand.  Abrasion on right knee, mild pain in base of hand, her right cheek is what hurts most.  No vision changes, no confusion. Hit right side of face on pavement  No LOC.  No proceeding chest pain, no dizziness, no proceeding symtpoms. She had been feeling well prior to falling. NO SOB, no fever.  Came straight to MD office.  Here today with nephew.  On ASA 325 mg     Problems Prior to Update: 1)  Other Screening Mammogram  (ICD-V76.12) 2)  Unspecified Peripheral Vascular Disease  (ICD-443.9) 3)  Wound, Leg  (ICD-891.0) 4)  Uti  (ICD-599.0) 5)  Cellulitis, Foot, Left  (ICD-682.7) 6)  Allergic Rhinitis  (ICD-477.9) 7)  Paroxysmal Atrial Fibrillation  (ICD-427.31) 8)  Constipation  (ICD-564.00) 9)  Hypertension  (ICD-401.9) 10)  Cerebrovascular Accident, Hx of  (ICD-V12.50) 11)  Hyperlipidemia  (ICD-272.4) 12)  Fibromyalgia  (ICD-729.1) 13)  Hiatal Hernia  (ICD-553.3) 14)  Uti's, Hx of  (ICD-V13.00) 15)  Family History Breast Cancer 1st Degree Relative <50  (ICD-V16.3)  Current Medications (verified): 1)  Metoprolol Tartrate 25 Mg Tabs (Metoprolol Tartrate) .... Take 1 Two Times A Day 2)  Senokot 8.6 Mg Tabs (Sennosides) .... Take 1 By Mouth Two Times A Day - Hold For Loose Bowels 3)  Aspirin 325 Mg Tabs (Aspirin) .... Once Daily 4)   Calcium Carbonate 600 Mg Tabs (Calcium Carbonate) .... Once Daily 5)  Vitamin C 500 Mg Tabs (Ascorbic Acid) .... Once Daily 6)  Centrum Silver  Tabs (Multiple Vitamins-Minerals) .... Once Daily 7)  Fish Oil 300 Mg Caps (Omega-3 Fatty Acids) .Marland Kitchen.. 1 Cap Once Daily 8)  Losartan Potassium 100 Mg Tabs (Losartan Potassium) .Marland Kitchen.. 1 By Mouth Once Daily 9)  Amlodipine Besylate 10 Mg Tabs (Amlodipine Besylate) .... Take One By Mouth Daily  Allergies: 1)  ! * Antibiotics 2)  ! * Cholestrol Meds  Past History:  Past medical, surgical, family and social histories (including risk factors) reviewed, and no changes noted (except as noted below).  Past Medical History: Reviewed history from 01/03/2010 and no changes required. Hyperlipidemia Hypertension  PAF - spont conv NSR 02/2009 hosp fibromyalgia Nonconstricting schatzki ring - EGD 02/2009 Abnormal CT abdomen: cystic area in pancreatic head (02/2009) hx hyperparathroid, s/p surg in 1980s PAD CVA 2007    MD roster: rheum Kellie Simmering  GI - [facility] -[place] [facility]    Past Surgical History: Reviewed history from 01/03/2010 and no changes required. Appendectomy (4540) Hysterectomy (9811'B) Stroke (2007) - right side residual pain parathyroidectomy - 1980s  Family History: Reviewed history from 04/01/2009 and no changes required. Family History Breast cancer 1st degree relative <50 (parent) Family History of  Prostate CA 1st degree relative <50 (parent)  Social History: Reviewed history from 04/01/2009 and no changes required. Never Smoked no alcohol widowed, lives alone in her home in [place] supportive family nearby [place] and independant with ADLs including driving, cooking, bathing, dressing, etc  Review of Systems General:  Denies fatigue and fever. CV:  Denies chest pain or discomfort. Resp:  Denies shortness of breath. GI:  Denies abdominal pain. GU:  Denies dysuria.  Physical Exam  General:  elderly female in NAD  Head:   LArge hematoma over right maxillary bone with focal tenderness Eyes:  No corneal or conjunctival inflammation noted. EOMI. Perrla. Funduscopic exam benign, without hemorrhages, exudates or papilledema. Vision grossly normal. Ears:  External ear exam shows no significant lesions or deformities.  Otoscopic examination reveals clear canals, tympanic membranes are intact bilaterally without bulging, retraction, inflammation or discharge. Hearing is grossly normal bilaterally. Nose:  External nasal examination shows no deformity or inflammation. Nasal mucosa are pink and moist without lesions or exudates. Mouth:  MMM Neck:  no carotid bruit or thyromegaly no cervical or supraclavicular lymphadenopathy  Lungs:  Normal respiratory effort, chest expands symmetrically. Lungs are clear to auscultation, no crackles or wheezes. Heart:  Normal rate and regular rhythm. S1 and S2 normal without gallop, murmur, click, rub or other extra sounds. Abdomen:  Bowel sounds positive,abdomen soft and non-tender without masses, organomegaly or hernias noted. Msk:  full ROm in right knee, no focal pain in knee  right wrist contusion radial aspect, diffusemild  ttp over carpal bones  full ORM in wrist Pulses:  .R radial normal.   Extremities:  no edema Neurologic:  No cranial nerve deficits noted. Station and gait are normal. Plantar reflexes are down-going bilaterally. DTRs are symmetrical throughout. Sensory, motor and coordinative functions appear intact. Skin:  abrasion right lateral knee     Impression & Recommendations:  Problem # 1:  FACIAL PAIN (ICD-784.0) Large hematoma over right maxillary bone.. will send for CT scan of facial bones to eval for fracture given likely osteoporosis (has never had DXA)  Pt with hx of PAF on 325 mg ASA...high risk for subdural bleed... will eval with head CT.   Ice applied, tylenol for pain. Orders: T-Wrist Comp Right (73110TC)  Her updated medication list for this problem  includes:    Metoprolol Tartrate 25 Mg Tabs (Metoprolol tartrate) .Marland Kitchen... Take 1 two times a day    Aspirin 325 Mg Tabs (Aspirin) ..... Once daily  Problem # 2:  WRIST PAIN, RIGHT (ICD-719.43) Xray today performed to eval for fracture.  Orders: T-Wrist Comp Right (73110TC)  Problem # 3:  ACCIDENTAL FALL (ICD-E888.9) Accidental fall.. no clear proceeding symptoms.  Orders: Radiology Referral (Radiology)  Complete Medication List: 1)  Metoprolol Tartrate 25 Mg Tabs (Metoprolol tartrate) .... Take 1 two times a day 2)  Senokot 8.6 Mg Tabs (Sennosides) .... Take 1 by mouth two times a day - hold for loose bowels 3)  Aspirin 325 Mg Tabs (Aspirin) .... Once daily 4)  Calcium Carbonate 600 Mg Tabs (Calcium carbonate) .... Once daily 5)  Vitamin C 500 Mg Tabs (Ascorbic acid) .... Once daily 6)  Centrum Silver Tabs (Multiple vitamins-minerals) .... Once daily 7)  Fish Oil 300 Mg Caps (Omega-3 fatty acids) .Marland Kitchen.. 1 cap once daily 8)  Losartan Potassium 100 Mg Tabs (Losartan potassium) .Marland Kitchen.. 1 by mouth once daily 9)  Amlodipine Besylate 10 Mg Tabs (Amlodipine besylate) .... Take one by mouth daily  Patient Instructions: 1)  Referral  Appointment Information 2)  Day/Date: 3)  Time: 4)  Place/MD: 5)  Address: 6)  Phone/Fax: 7)  Patient given appointment information. Information/Orders faxed/mailed.  8)  We will call with results of X-rays.   Orders Added: 1)  Radiology Referral [Radiology] 2)  T-Wrist Comp Right [73110TC] 3)  Est. Patient Level IV [40981]    Current Allergies (reviewed today): ! * ANTIBIOTICS ! * CHOLESTROL MEDS

## 2010-05-31 ENCOUNTER — Telehealth: Payer: Self-pay | Admitting: Family Medicine

## 2010-06-01 NOTE — Assessment & Plan Note (Signed)
Summary: 2 WEEK FOLLOWUP/RBH   Vital Signs:  Patient profile:   77 year old female Height:      65 inches Weight:      116.25 pounds BMI:     19.41 Temp:     98.7 degrees F oral Pulse rate:   68 / minute Pulse rhythm:   regular BP sitting:   140 / 80  (left arm) Cuff size:   regular  Vitals Entered By: Linde Gillis CMA Duncan Dull) (May 22, 2010 12:20 PM) CC: 2 week follow up, follow up after fall on Friday   History of Present Illness: 77 year old female with history of PAF, HTN, hx of CVA here for follow up after fall on Friday and to follow up blood pressure.  Lost her balance at [facility] on Friday. It had just started raining heavily. Hit right knee, caught herself with her right hand.  Abrasion on right knee, mild pain in base of hand, her right cheek is what hurts most.  No vision changes, no confusion. Hit right side of face on pavement  No LOC.  No proceeding chest pain, no dizziness, no proceeding symtpoms. She had been feeling well prior to falling. NO SOB, no fever.  Head CT negative for fracture, right wrist xray negative for fracture.  Pain is not bad at all, feels like hematoma on right cheek has gone down in size and facial brusing improved.  HTN- increased amlodpine to 10 mg daily two weeks ago, remains on Metoprolol 25 mg two times a day.  Brings in BP log, ranges from 115/49- 153/61 but pulse is as low 50.  Feels sometimes a little low on energy but no dizziness or presyncope.    Current Medications (verified): 1)  Metoprolol Tartrate 25 Mg Tabs (Metoprolol Tartrate) .... Take 1 Tablet By Mouth At Bedtime. 2)  Senokot 8.6 Mg Tabs (Sennosides) .... Take 1 By Mouth Two Times A Day - Hold For Loose Bowels 3)  Aspirin 325 Mg Tabs (Aspirin) .... Once Daily 4)  Calcium Carbonate 600 Mg Tabs (Calcium Carbonate) .... Once Daily 5)  Vitamin C 500 Mg Tabs (Ascorbic Acid) .... Once Daily 6)  Centrum Silver  Tabs (Multiple Vitamins-Minerals) .... Once Daily 7)   Fish Oil 300 Mg Caps (Omega-3 Fatty Acids) .Marland Kitchen.. 1 Cap Once Daily 8)  Losartan Potassium 100 Mg Tabs (Losartan Potassium) .Marland Kitchen.. 1 By Mouth Once Daily 9)  Amlodipine Besylate 10 Mg Tabs (Amlodipine Besylate) .... Take One By Mouth Daily  Allergies: 1)  ! * Antibiotics 2)  ! * Cholestrol Meds  Past History:  Past Medical History: Last updated: 01/03/2010 Hyperlipidemia Hypertension  PAF - spont conv NSR 02/2009 hosp fibromyalgia Nonconstricting schatzki ring - EGD 02/2009 Abnormal CT abdomen: cystic area in pancreatic head (02/2009) hx hyperparathroid, s/p surg in 1980s PAD CVA 2007    MD roster: rheum Kellie Simmering  GI - [facility]    Past Surgical History: Last updated: 01/03/2010 Appendectomy (1940) Hysterectomy (1970's) Stroke (2007) - right side residual pain parathyroidectomy - 1980s  Family History: Last updated: 04/01/2009 Family History Breast cancer 1st degree relative <50 (parent) Family History of Prostate CA 1st degree relative <50 (parent)  Social History: Last updated: 04/01/2009 Never Smoked no alcohol widowed, lives alone in her home in [place] supportive family nearby [place] and independant with ADLs including driving, cooking, bathing, dressing, etc  Risk Factors: Alcohol Use: 0 (04/01/2009) Exercise: yes (04/01/2009)  Risk Factors: Smoking Status: never (04/01/2009)  Review of  Systems      See HPI CV:  Denies chest pain or discomfort. Resp:  Denies shortness of breath. Neuro:  Denies disturbances in coordination, headaches, and visual disturbances.  Physical Exam  General:  elderly female in NAD  Head:  Large hematoma over right maxillary bone with focal tenderness, per pt and her niece, smaller in size Eyes:  No corneal or conjunctival inflammation noted. EOMI. Perrla. Funduscopic exam benign, without hemorrhages, exudates or papilledema. Vision grossly normal. Mouth:  MMM Neck:  extensive brusing on neck and chin Lungs:   Normal respiratory effort, chest expands symmetrically. Lungs are clear to auscultation, no crackles or wheezes. Heart:  Normal rate and regular rhythm. S1 and S2 normal without gallop, murmur, click, rub or other extra sounds. Msk:  right wrist contusion radial aspect, FROM of wrist Extremities:  Right knee abraision- no pus or erythema Psych:  Oriented X3, memory intact for recent and remote, normally interactive, good eye contact, not anxious appearing, not depressed appearing, and not agitated.      Impression & Recommendations:  Problem # 1:  ACCIDENTAL FALL (ICD-E888.9) Assessment Improved Wounds appearing to be improving.  Pt reports pain is tolerable, does not feel like she needs to take anything.    Problem # 2:  HYPERTENSION (ICD-401.9) Assessment: Improved Will decrease Metoprolol, see below. Her updated medication list for this problem includes:    Metoprolol Tartrate 25 Mg Tabs (Metoprolol tartrate) .Marland Kitchen... Take 1 tablet by mouth at bedtime.    Losartan Potassium 100 Mg Tabs (Losartan potassium) .Marland Kitchen... 1 by mouth once daily    Amlodipine Besylate 10 Mg Tabs (Amlodipine besylate) .Marland Kitchen... Take one by mouth daily  Problem # 3:  BRADYCARDIA (ICD-427.89) Assessment: New  decrease metoprolol to 25 mg daily.  Follow up in one month. Her updated medication list for this problem includes:    Metoprolol Tartrate 25 Mg Tabs (Metoprolol tartrate) .Marland Kitchen... Take 1 tablet by mouth at bedtime.    Aspirin 325 Mg Tabs (Aspirin) ..... Once daily  Orders: Prescription Created Electronically [id number])  Complete Medication List: 1)  Metoprolol Tartrate 25 Mg Tabs (Metoprolol tartrate) .... Take 1 tablet by mouth at bedtime. 2)  Senokot 8.6 Mg Tabs (Sennosides) .... Take 1 by mouth two times a day - hold for loose bowels 3)  Aspirin 325 Mg Tabs (Aspirin) .... Once daily 4)  Calcium Carbonate 600 Mg Tabs (Calcium carbonate) .... Once daily 5)  Vitamin C 500 Mg Tabs (Ascorbic acid) .... Once daily 6)   Centrum Silver Tabs (Multiple vitamins-minerals) .... Once daily 7)  Fish Oil 300 Mg Caps (Omega-3 fatty acids) .Marland Kitchen.. 1 cap once daily 8)  Losartan Potassium 100 Mg Tabs (Losartan potassium) .Marland Kitchen.. 1 by mouth once daily 9)  Amlodipine Besylate 10 Mg Tabs (Amlodipine besylate) .... Take one by mouth daily  Patient Instructions: 1)  Great to see you. 2)  We are decreasing your metoprolol to 25 mg at bedtime. 3)  Follow up in one month, sooner if you feel bad. Prescriptions: METOPROLOL TARTRATE 25 MG TABS (METOPROLOL TARTRATE) take 1 tablet by mouth at bedtime.  #30 x 6   Entered and Authorized by:   Ruthe Mannan MD   Signed by:   Ruthe Mannan MD on 05/22/2010   Method used:   Electronically to        [facility]. [address] [phone]* (retail)       [address] [place], [place]  [id number]  Ph: [phone]       Fax: [phone]   RxID:   [id number]    Orders Added: 1)  Prescription Created Electronically [[id number]] 2)  Est. Patient Level IV [41660]    Current Allergies (reviewed today): ! * ANTIBIOTICS ! * CHOLESTROL MEDS

## 2010-06-06 NOTE — Progress Notes (Signed)
Summary: wants to change time she takes metoprolol  Phone Note Call from Patient Call back at [phone]   Caller: Arloa Koh Summary of Call: Pamela [name] is asking if pt can change the time she takes the metoprolol, taking it at bedtime is making her BP too low in the mornings- around 115/50.  She is asking if she can take all 3 meds- metoprolol, losartan, amlodipine- at the same time, around lunch time.  Her pulse has gone up to 60's-70's. Initial call taken by: Lowella Petties CMA, AAMA,  May 31, 2010 11:51 AM  Follow-up for Phone Call        yes she can. Ruthe Mannan MD  May 31, 2010 11:53 AM  Advised pts neice.  She said she might experiment wtih the times pt takes metoprolol, might try it at suppertime and see what happens. Says this morning pt's BP was better, around 140/70. Follow-up by: Lowella Petties CMA, AAMA,  May 31, 2010 12:18 PM

## 2010-06-15 ENCOUNTER — Encounter: Payer: Self-pay | Admitting: Family Medicine

## 2010-06-21 ENCOUNTER — Ambulatory Visit (INDEPENDENT_AMBULATORY_CARE_PROVIDER_SITE_OTHER): Payer: MEDICARE | Admitting: Family Medicine

## 2010-06-21 ENCOUNTER — Encounter: Payer: Self-pay | Admitting: Family Medicine

## 2010-06-21 VITALS — BP 136/62 | HR 60 | Temp 98.6°F | Ht 64.0 in | Wt 116.8 lb

## 2010-06-21 DIAGNOSIS — I10 Essential (primary) hypertension: Secondary | ICD-10-CM

## 2010-06-21 MED ORDER — AMLODIPINE BESYLATE 10 MG PO TABS: 5.0000 mg | ORAL_TABLET | Freq: Every day | ORAL | Status: DC

## 2010-06-21 NOTE — Patient Instructions (Signed)
We have changed your amlodipine to 5 mg daily instead of 10 mg daily. Continue your other medication as prescribed. Call me a in a couple of weeks with an update of your blood pressure.

## 2010-06-21 NOTE — Assessment & Plan Note (Signed)
Concerns with feeling dizzy when BP drops below 120 systolic. Will decrease amlodipine to 5 mg daily, continue other medications at current dose. See pt instructions for details.

## 2010-06-21 NOTE — Progress Notes (Signed)
  Subjective:    Patient ID: Pamela [name], female    DOB: December 08, 1916, 77 y.o.   MRN: [id number]  HPI 77 year old female with history of PAF, HTN, hx of CVA here for one month follow up.     Lost her balance at [employer] last month.   Hit right knee, caught herself with her right hand.  Abrasion on right knee, mild pain in base of hand, her right cheek is what hurts most.  No vision changes, no confusion. Hit right side of face on pavement  No LOC.  No proceeding chest pain, no dizziness, no proceeding symtpoms. She had been feeling well prior to falling. NO SOB, no fever.  Head CT negative for fracture, right wrist xray negative for fracture.  Pain is not bad at all, feels like hematoma on right cheek has gone down in size and facial brusing improved.  HTN- increased amlodpine to 10 mg daily last month and decreased Metoprolol 25 mg daily.  Brings in BP log, ranges from 115/49- 153/61 but pulse is as low 50.  Feels sometimes a little low on energy but no dizziness or presyncope. BP Readings from Last 3 Encounters:  06/21/10 136/62  05/22/10 140/80  05/19/10 140/64    Lab Results  Component Value Date   CREATININE 0.81 03/25/2009     Hypertension:    She brings in home BP log, ranging from 115/60- 146/56.  When it drops below 120 systolic, she feels dizzy. Chest pain with exertion: no Edema:no Short of breath: no     Review of Systems      See HPI CV:  Denies chest pain or discomfort. Resp:  Denies shortness of breath. Neuro:  Denies disturbances in coordination, headaches, and visual disturbances.    Objective:   Physical Exam    General:  elderly female in NAD  Head:  Large hematoma over right maxillary bone with focal tenderness, per pt and her niece, smaller in size Eyes:  No corneal or conjunctival inflammation noted. EOMI. Perrla. Funduscopic exam benign, without hemorrhages, exudates or papilledema. Vision grossly normal. Mouth:  MMM Neck:  extensive  brusing on neck and chin Lungs:  Normal respiratory effort, chest expands symmetrically. Lungs are clear to auscultation, no crackles or wheezes. Heart:  Normal rate and regular rhythm. S1 and S2 normal without gallop, murmur, click, rub or other extra sounds. Msk:  right wrist contusion radial aspect, FROM of wrist Extremities:  Right knee abraision- no pus or erythema Psych:  Oriented X3, memory intact for recent and remote, normally interactive, good eye contact, not anxious appearing, not depressed appearing, and not agitated.        Assessment & Plan:

## 2010-06-26 LAB — POCT CARDIAC MARKERS
CKMB, poc: 1.5 ng/mL (ref 1.0–8.0)
CKMB, poc: 2.1 ng/mL (ref 1.0–8.0)
CKMB, poc: 8.3 ng/mL (ref 1.0–8.0)
Troponin i, poc: <0.05 ng/mL (ref 0.00–0.09)
Troponin i, poc: <0.05 ng/mL (ref 0.00–0.09)
Troponin i, poc: <0.05 ng/mL (ref 0.00–0.09)

## 2010-06-26 LAB — COMPREHENSIVE METABOLIC PANEL
ALT: 17 U/L (ref 0–35)
Albumin: 3.9 g/dL (ref 3.5–5.2)
Alkaline Phosphatase: 86 U/L (ref 39–117)
Calcium: 9.3 mg/dL (ref 8.4–10.5)
GFR calc Af Amer: 60 mL/min — ABNORMAL LOW (ref >60)
Glucose, Bld: 133 mg/dL — ABNORMAL HIGH (ref 70–99)
Potassium: 4 mEq/L (ref 3.5–5.1)
Sodium: 139 mEq/L (ref 135–145)
Total Protein: 7 g/dL (ref 6.0–8.3)

## 2010-06-26 LAB — URINE CULTURE

## 2010-06-26 LAB — CBC
HCT: 37.8 % (ref 36.0–46.0)
HCT: 37.9 % (ref 36.0–46.0)
HCT: 42.7 % (ref 36.0–46.0)
Hemoglobin: 12.3 g/dL (ref 12.0–15.0)
Hemoglobin: 12.8 g/dL (ref 12.0–15.0)
Hemoglobin: 12.9 g/dL (ref 12.0–15.0)
Hemoglobin: 14.4 g/dL (ref 12.0–15.0)
MCHC: 33.6 g/dL (ref 30.0–36.0)
MCV: 93.8 fL (ref 78.0–100.0)
MCV: 93.9 fL (ref 78.0–100.0)
Platelets: 247 10*3/uL (ref 150–400)
RBC: 3.89 MIL/uL (ref 3.87–5.11)
RBC: 4.05 MIL/uL (ref 3.87–5.11)
RDW: 13.4 % (ref 11.5–15.5)
WBC: 12.3 10*3/uL — ABNORMAL HIGH (ref 4.0–10.5)
WBC: 7 10*3/uL (ref 4.0–10.5)

## 2010-06-26 LAB — URINALYSIS, ROUTINE W REFLEX MICROSCOPIC
Bilirubin Urine: NEGATIVE
Glucose, UA: NEGATIVE mg/dL
Glucose, UA: NEGATIVE mg/dL
Hgb urine dipstick: NEGATIVE
Ketones, ur: NEGATIVE mg/dL
Ketones, ur: NEGATIVE mg/dL
Protein, ur: 30 mg/dL — AB
Protein, ur: NEGATIVE mg/dL
pH: 8 (ref 5.0–8.0)

## 2010-06-26 LAB — BASIC METABOLIC PANEL
BUN: 10 mg/dL (ref 6–23)
BUN: 16 mg/dL (ref 6–23)
CO2: 32 mEq/L (ref 19–32)
Calcium: 8.9 mg/dL (ref 8.4–10.5)
Chloride: 102 mEq/L (ref 96–112)
Chloride: 102 mEq/L (ref 96–112)
Chloride: 103 mEq/L (ref 96–112)
Chloride: 99 mEq/L (ref 96–112)
Creatinine, Ser: 0.83 mg/dL (ref 0.4–1.2)
GFR calc Af Amer: >60 mL/min (ref >60)
GFR calc non Af Amer: >60 mL/min (ref >60)
GFR calc non Af Amer: >60 mL/min (ref >60)
GFR calc non Af Amer: >60 mL/min (ref >60)
Glucose, Bld: 101 mg/dL — ABNORMAL HIGH (ref 70–99)
Potassium: 3.3 mEq/L — ABNORMAL LOW (ref 3.5–5.1)
Potassium: 4.2 mEq/L (ref 3.5–5.1)
Potassium: 4.3 mEq/L (ref 3.5–5.1)
Potassium: 5 mEq/L (ref 3.5–5.1)
Sodium: 136 mEq/L (ref 135–145)
Sodium: 137 mEq/L (ref 135–145)
Sodium: 140 mEq/L (ref 135–145)
Sodium: 140 mEq/L (ref 135–145)

## 2010-06-26 LAB — URINE MICROSCOPIC-ADD ON

## 2010-06-26 LAB — DIFFERENTIAL
Basophils Absolute: 0 10*3/uL (ref 0.0–0.1)
Eosinophils Relative: 0 % (ref 0–5)
Eosinophils Relative: 2 % (ref 0–5)
Lymphocytes Relative: 15 % (ref 12–46)
Lymphocytes Relative: 31 % (ref 12–46)
Lymphs Abs: 2.2 10*3/uL (ref 0.7–4.0)
Monocytes Absolute: 0.5 10*3/uL (ref 0.1–1.0)
Neutro Abs: 3.9 10*3/uL (ref 1.7–7.7)

## 2010-06-26 LAB — CK TOTAL AND CKMB (NOT AT ARMC)
CK, MB: 2.1 ng/mL (ref 0.3–4.0)
CK, MB: 2.2 ng/mL (ref 0.3–4.0)
CK, MB: 2.6 ng/mL (ref 0.3–4.0)
Relative Index: INVALID (ref 0.0–2.5)
Relative Index: INVALID (ref 0.0–2.5)
Total CK: 92 U/L (ref 7–177)

## 2010-06-26 LAB — PROTIME-INR
INR: 1.04 (ref 0.00–1.49)
Prothrombin Time: 13.5 seconds (ref 11.6–15.2)

## 2010-06-26 LAB — TROPONIN I: Troponin I: 0.02 ng/mL (ref 0.00–0.06)

## 2010-06-26 LAB — LIPID PANEL
Cholesterol: 208 mg/dL — ABNORMAL HIGH (ref 0–200)
LDL Cholesterol: 122 mg/dL — ABNORMAL HIGH (ref 0–99)
VLDL: 37 mg/dL (ref 0–40)

## 2010-06-26 LAB — HEMOGLOBIN A1C: Mean Plasma Glucose: 120 mg/dL

## 2010-08-11 NOTE — Discharge Summary (Signed)
Pamela, [name]                ACCOUNT NO.:  [id number]   MEDICAL RECORD NO.:  [id number]          PATIENT TYPE:  INP   LOCATION:  3005                         FACILITY:  [facility]   PHYSICIAN:  Pramod P. Pearlean Brownie, MD    DATE OF BIRTH:  January 26, 1917   DATE OF ADMISSION:  03/31/2004  DATE OF DISCHARGE:  04/04/2004                                 DISCHARGE SUMMARY   DIAGNOSES AT TIME OF DISCHARGE:  1.  Left subcortical infarction secondary to small vessel disease.  2.  Dyslipidemia.  3.  Hypertension.  4.  Fibromyalgia.  5.  Status post appendectomy.  6.  Status post hysterectomy.   MEDICATIONS AT TIME OF DISCHARGE:  1.  Aspirin 325 mg daily.  2.  Zocor 10 mg daily.  3.  Cardizem 240 mg daily.   STUDIES PERFORMED:  1.  CT scan of the brain on admission shows mild diffuse cerebral atrophy      and cerebellar atrophy.  Old left basal ganglia lacune, small vessel      disease, no acute abnormality.  2.  MRI of the brain shows sub centimeter acute stroke in the posterior limb      of the internal capsule left thalamus, old mild small vessel changes,      old flare.  3.  MRA of the neck shows likely fibromuscular dysplasia, both cervical      internal carotid arteries and upper cervical vertebral arteries.      Stenoses are impossible to grade but could be some flow in the stenosis      or certainly could serve as a focus for thrombus embolus formation.  4.  MRA of the head shows no significant intracranial pathology of large or      medium vessels.  5.  EKG shows normal sinus rhythm, cannot rule out anterior infarct.  6.  A 2D echocardiogram has been performed, results are pending.  7.  Carotid Doppler shows right without stenosis, left 40-60% internal      carotid artery stenosis.  By velocity, may be due to tortuosity only and      vertebral artery heard antegrade bilaterally.  8.  Transcranial Doppler's performed, results are pending.   LABORATORY STUDIES:  Homocystine normal.   Cholesterol 228, triglycerides  157, HDL 51, and LDL 146.  CBC is normal.  Differential is normal.  Coagulation studies are normal.  Chemistries normal.  Liver function tests  are normal.  Hemoglobin A1c is normal.  Urine drug screen is negative and  urinalysis has 15 mg of protein.   HISTORY OF PRESENT ILLNESS:  Pamela [name] is an 77 year old right-handed  white female with sudden onset right upper extremity numbness the day of  admission that got worse one-half hour later and involved the right leg and  right face.  She has also had some gait ataxia and leans to the right.  She  has a history of fibromyalgia and increased lipids only on Cardizem prior to  admission.  She did remember having a funny feeling on the right side of her  chin the  day prior and also a headache that remains from yesterday.  She is  not a TPA candidate secondary to mildness of the symptoms and uncertainty of  onset with headaches starting the day prior to yesterday, or the day prior  to admission.  She will be admitted for further stroke evaluation.   The patient did have an acute infarct felt secondary to small vessel  disease.  She was started on a low dose Zocor.  She had no PT and OT needs  and was felt safe to be discharged home.  No other risk factors were  identified. A 2D echocardiogram does remain pending at the time of  discharge.  We decided to put the patient on aspirin for secondary stroke  prevention and follow up in two to three months.   CONDITION AT DISCHARGE:  The patient's visual fields are full.  Face is  symmetric.  Extraocular movements are intact.  Tongue is midline.  Motor:  Arms without drift.  Strength is normal.  Sensation with subjective changes  in the right upper extremity, hyporeflexic and gait slightly broad-based but  steady.   DISCHARGE PLAN:  Discharge to home, aspirin for secondary stroke prevention.  Needs Zocor.  Follow up in six to eight weeks.   FOLLOW UP:  Follow  up with primary M.D. one month.  Follow up with Dr. Pearlean Brownie  in two to three months.       SB/MEDQ  D:  04/04/2004  T:  04/04/2004  Job:  [id number]   cc:   Pramod P. Pearlean Brownie, MD  Fax: [phone]   Loma Sender  [address]  [place]  [place] [address]  Fax: [phone]

## 2010-08-11 NOTE — H&P (Signed)
NAME:  Pamela [name], Pamela [name]                ACCOUNT NO.:  [id number]   MEDICAL RECORD NO.:  [id number]          PATIENT TYPE:  EMS   LOCATION:  MAJO                         FACILITY:  [facility]   PHYSICIAN:  Pramod P. Pearlean Brownie, MD    DATE OF BIRTH:  1916/04/19   DATE OF ADMISSION:  03/31/2004  DATE OF DISCHARGE:                                HISTORY & PHYSICAL   ADMISSION DIAGNOSIS:  Stroke.   HISTORY OF PRESENT ILLNESS:  Ms. Cowens is an 77 year old pleasant Caucasian  lady who developed sudden onset of right upper extremity numbness at about  1:30 p.m. while driving.  The symptoms gradually got worse after a half an  hour when she noticed the numbness involving the whole right side of the  body.  She did have mild funny feeling on the right side of the chin, but to  not to the same degree as in the arm.  She also noticed that balance was off  and she was leaning to the right.  She denied any slurred speech, vision  loss, vertigo.  She does admit on inquiry that she did have a headache since  yesterday which did not go away and was present this morning as well.  She  denies any prior history of stroke, transient ischemic attack, or  significant aortic problems.   PAST MEDICAL HISTORY:  Significant only for fibromyalgia and hyperlipidemia.   PAST SURGICAL HISTORY:  Appendectomy, hysterectomy.   HOME MEDICATIONS:  Cardizem CD 240 mg daily.   ALLERGIES:  MYCINS.   SOCIAL HISTORY:  She is retired.  She lives in [place] by herself.  She  is independent in activities of daily living.  Does not smoke or drink.  Family physician is Dr. Vear Clock in [place].   REVIEW OF SYSTEMS:  Not significant for loss of weight, cough, chest pain,  diarrhea.   PHYSICAL EXAMINATION:  GENERAL:  Reveals a pleasant elderly Caucasian lady  who is not in distress.  VITAL SIGNS:  She is afebrile, pulse 78 per minute, regular sinus, blood  pressure 198/110, respiratory rate 18 per minute, saturation 95% on  two  liters oxygen. Distal pulses__________.  HEENT:  Head nontraumatic.  ENT:  Significant for decreased hearing on the  right. She does wear a hearing aid on the left.  NECK:  Supple without bruit.  CARDIAC:  Regular heart sounds.  No murmur or gallop.  LUNGS:  Clear to auscultation.  ABDOMEN:  Soft, nontender.  NEUROLOGIC:  She is awake, alert, oriented x3 with normal speech and  language function.  There is no aphasia, apraxia, or dysarthria.  Pupils are  irregular, but reactive.  Visual acuity and fields adequate.  Face is  symmetric.  Bilateral movements are normal.  Tongue is midline.  Motor  system exam reveals no upper extremity drift.  Reflexes symmetric.  Gait was  not tested.  Plantars are both downgoing.   DATA REVIEWED:  Noncontrast CAT scan of the head done today reveals no acute  abnormalities.  Low density is noted in the left basal ganglia consistent  with remote age small  lacunar infarction.  Admission labs are pending at  this time.  EKG reveals normal sinus rhythm without acute ischemic findings.   IMPRESSION:  An 77 year old lady with sudden onset of right-sided  paresthesia, then gait difficulties likely due to small left hemispheric  subcortical infarction.   PLAN:  Admit patient to 3000 telemetry stroke unit for risk stratification  workup.  Start her on aspirin 325 mg daily.  MRI scan of the brain with MRA  of the brain, neck, and Doppler studies, carotid ultrasound and check  fasting profile, hemoglobin A1c, and homocystine levels.  Physical and  occupational therapy consults.  I had long discussion with the patient and  family members and answered questions.       PPS/MEDQ  D:  03/31/2004  T:  03/31/2004  Job:  [id number]

## 2010-09-08 ENCOUNTER — Encounter (HOSPITAL_BASED_OUTPATIENT_CLINIC_OR_DEPARTMENT_OTHER): Payer: Medicare Other | Attending: General Surgery

## 2010-09-08 DIAGNOSIS — I872 Venous insufficiency (chronic) (peripheral): Secondary | ICD-10-CM | POA: Insufficient documentation

## 2010-09-08 DIAGNOSIS — G589 Mononeuropathy, unspecified: Secondary | ICD-10-CM | POA: Insufficient documentation

## 2010-09-08 DIAGNOSIS — Z79899 Other long term (current) drug therapy: Secondary | ICD-10-CM | POA: Insufficient documentation

## 2010-09-08 DIAGNOSIS — I10 Essential (primary) hypertension: Secondary | ICD-10-CM | POA: Insufficient documentation

## 2010-09-08 DIAGNOSIS — L97309 Non-pressure chronic ulcer of unspecified ankle with unspecified severity: Secondary | ICD-10-CM | POA: Insufficient documentation

## 2010-09-08 DIAGNOSIS — Z7982 Long term (current) use of aspirin: Secondary | ICD-10-CM | POA: Insufficient documentation

## 2010-09-08 DIAGNOSIS — E785 Hyperlipidemia, unspecified: Secondary | ICD-10-CM | POA: Insufficient documentation

## 2010-09-08 DIAGNOSIS — K219 Gastro-esophageal reflux disease without esophagitis: Secondary | ICD-10-CM | POA: Insufficient documentation

## 2010-09-09 NOTE — Assessment & Plan Note (Unsigned)
[facility]  NAME:  Pamela [name], Pamela [name]                ACCOUNT NO.:  [id number]  MEDICAL RECORD NO.:  [id number]      DATE OF BIRTH:  09/21/1916  PHYSICIAN:  Leonie Man, M.D.    VISIT DATE:  09/08/2010                                  OFFICE VISIT   PROBLEM:  Ulcer of the ankle at the left foot, overlying the large vein in this 77 year old patient, with history of venous leg ulcer in the past.  She was treated here at this [facility] last in December of 2011.  The ulcer is asymptomatic with no prior treatment, having been initiated up until this date.  ALLERGIES:  The patient claims MULTIPLE ANTIBIOTIC allergies.  However, she does not remember what antibiotics she is allergic to.  She is also allergic to CHOLESTEROL MEDICATIONS.  CURRENT MEDICATIONS:  Losartan, amlodipine, metoprolol, aspirin, multivitamins, vitamin D3, fish oil, and stool softeners.  PAST MEDICAL HISTORY: 1. The patient is status post CVA with residual right-sided weakness.     She has a history of paroxysmal atrial fibrillation alternating     with sinus rhythm. 2. Hypertension. 3. Neuropathy. 4. Hyperlipidemia. 5. Fibromyalgia. 6. GERD. 7. Pancreatic cyst, undetermined etiology.  She is also status post     parathyroidectomy for hyperparathyroidism, appendectomy, and total     abdominal hysterectomy.  She has had some skin cancers removed in     the past.  REVIEW OF SYSTEMS:  Negative except as outlined above.  PHYSICAL EXAMINATION:  GENERAL:  The patient is alert, slightly hard of hearing. VITAL SIGNS:  Her temperature is 97.9, pulse 63, respirations 20, blood pressure 195/61. HEAD AND NECK:  Head is normocephalic.  Pupils are round, regular. Sclerae are anicteric. LUNGS:  Clear to auscultation. HEART:  Shows a regular rate and rhythm at this time.  There are no murmurs heard. ABDOMEN:  Soft, nontender, nondistended with normoactive bowel sounds. No masses, visceromegaly, or  hernias are palpated. EXTREMITIES:  There is an ulcer of 0.4 x 0.4 x 0.1 cm at the left ankle. This is more medial than her previous ulcer.  There is no erythema.  The ulcers overlying the vein.  Her pedal pulses are palpable.  ASSESSMENT:  Venous leg ulcer, left ankle, treatment today will be mupirocin and compression stockings 20-30 mmHg pressure.  We will follow up with her in 1 week.     Leonie Man, M.D.     PB/MEDQ  D:  09/08/2010  T:  09/09/2010  Job:  [id number]

## 2010-10-13 ENCOUNTER — Encounter (HOSPITAL_BASED_OUTPATIENT_CLINIC_OR_DEPARTMENT_OTHER): Payer: Medicare Other | Attending: General Surgery

## 2010-10-13 DIAGNOSIS — Z79899 Other long term (current) drug therapy: Secondary | ICD-10-CM | POA: Insufficient documentation

## 2010-10-13 DIAGNOSIS — L97309 Non-pressure chronic ulcer of unspecified ankle with unspecified severity: Secondary | ICD-10-CM | POA: Insufficient documentation

## 2010-10-13 DIAGNOSIS — I10 Essential (primary) hypertension: Secondary | ICD-10-CM | POA: Insufficient documentation

## 2010-10-13 DIAGNOSIS — I872 Venous insufficiency (chronic) (peripheral): Secondary | ICD-10-CM | POA: Insufficient documentation

## 2010-10-13 DIAGNOSIS — K219 Gastro-esophageal reflux disease without esophagitis: Secondary | ICD-10-CM | POA: Insufficient documentation

## 2010-10-13 DIAGNOSIS — G589 Mononeuropathy, unspecified: Secondary | ICD-10-CM | POA: Insufficient documentation

## 2010-10-13 DIAGNOSIS — E785 Hyperlipidemia, unspecified: Secondary | ICD-10-CM | POA: Insufficient documentation

## 2010-10-13 DIAGNOSIS — Z7982 Long term (current) use of aspirin: Secondary | ICD-10-CM | POA: Insufficient documentation

## 2010-11-01 ENCOUNTER — Other Ambulatory Visit: Payer: Self-pay | Admitting: Family Medicine

## 2010-11-30 ENCOUNTER — Ambulatory Visit (INDEPENDENT_AMBULATORY_CARE_PROVIDER_SITE_OTHER): Payer: Medicare Other | Admitting: Family Medicine

## 2010-11-30 ENCOUNTER — Encounter: Payer: Self-pay | Admitting: Family Medicine

## 2010-11-30 DIAGNOSIS — K0510 Chronic gingivitis, plaque induced: Secondary | ICD-10-CM | POA: Insufficient documentation

## 2010-11-30 NOTE — Assessment & Plan Note (Signed)
She has an unremarkable exam other than the gingival irritation.  She may have had a transient URI that is resolved now.  I would f/u with dental clinic and see what else needed to be done.  D/w pt and family and they agree.  F/u prn here.

## 2010-11-30 NOTE — Progress Notes (Signed)
Went to dentist about 2 weeks ago.  She had a sore spot on the gum line on L upper outer gum line.  She was given in a mouthwash and then got better.  Then L outer lower gum line got irritated but it also got better.  Then she started having maxillary discomfort bilaterally after that without ear pain but some occ pressure and itching in the ears.  No fevers.  Minimal cough with occ sputum.  R facial sensitivity is much better today than yesterday.    ROS: See HPI.  Otherwise negative.    Meds, vitals, and allergies reviewed.   GEN: nad, alert and oriented HEENT: mucous membranes moist, TM w/o erythema, nasal epithelium not injected, OP without cobblestoning but some mild irritation on the L upper outer gumline noted NECK: supple w/o LA CV: rrr. PULM: ctab, no inc wob ABD: soft, +bs EXT: no edema

## 2010-11-30 NOTE — Patient Instructions (Signed)
You may have had a self resolving cold.  In the meantime, you still appear to have some irritation on the left upper outer portion of your gum.  Please call your dentist about follow up on this.  Take care.  Glad to see you.

## 2010-12-26 ENCOUNTER — Ambulatory Visit (INDEPENDENT_AMBULATORY_CARE_PROVIDER_SITE_OTHER): Payer: Medicare Other

## 2010-12-26 DIAGNOSIS — Z23 Encounter for immunization: Secondary | ICD-10-CM

## 2011-01-02 ENCOUNTER — Other Ambulatory Visit: Payer: Self-pay | Admitting: Family Medicine

## 2011-04-11 ENCOUNTER — Other Ambulatory Visit: Payer: Self-pay | Admitting: Internal Medicine

## 2011-04-11 MED ORDER — AMLODIPINE BESYLATE 10 MG PO TABS: 5.0000 mg | ORAL_TABLET | Freq: Every day | ORAL | Status: DC

## 2011-04-11 NOTE — Telephone Encounter (Signed)
Faxed Rx to pharmacy  

## 2011-04-30 ENCOUNTER — Encounter (HOSPITAL_COMMUNITY): Payer: Self-pay | Admitting: Emergency Medicine

## 2011-04-30 ENCOUNTER — Emergency Department (HOSPITAL_COMMUNITY): Payer: Medicare Other

## 2011-04-30 ENCOUNTER — Other Ambulatory Visit: Payer: Self-pay

## 2011-04-30 ENCOUNTER — Inpatient Hospital Stay (HOSPITAL_COMMUNITY)
Admission: EM | Admit: 2011-04-30 | Discharge: 2011-05-05 | DRG: 690 | Disposition: A | Discharge status: Skilled Nursing Facility | Payer: Medicare Other | Attending: Family Medicine | Admitting: Family Medicine

## 2011-04-30 DIAGNOSIS — E785 Hyperlipidemia, unspecified: Secondary | ICD-10-CM | POA: Diagnosis present

## 2011-04-30 DIAGNOSIS — I4891 Unspecified atrial fibrillation: Secondary | ICD-10-CM | POA: Diagnosis present

## 2011-04-30 DIAGNOSIS — IMO0001 Reserved for inherently not codable concepts without codable children: Secondary | ICD-10-CM | POA: Diagnosis present

## 2011-04-30 DIAGNOSIS — I10 Essential (primary) hypertension: Secondary | ICD-10-CM | POA: Diagnosis present

## 2011-04-30 DIAGNOSIS — A498 Other bacterial infections of unspecified site: Secondary | ICD-10-CM | POA: Diagnosis present

## 2011-04-30 DIAGNOSIS — K5900 Constipation, unspecified: Secondary | ICD-10-CM | POA: Insufficient documentation

## 2011-04-30 DIAGNOSIS — E213 Hyperparathyroidism, unspecified: Secondary | ICD-10-CM | POA: Diagnosis present

## 2011-04-30 DIAGNOSIS — E876 Hypokalemia: Secondary | ICD-10-CM | POA: Diagnosis present

## 2011-04-30 DIAGNOSIS — N390 Urinary tract infection, site not specified: Principal | ICD-10-CM | POA: Diagnosis present

## 2011-04-30 DIAGNOSIS — Z8679 Personal history of other diseases of the circulatory system: Secondary | ICD-10-CM | POA: Insufficient documentation

## 2011-04-30 DIAGNOSIS — I739 Peripheral vascular disease, unspecified: Secondary | ICD-10-CM | POA: Diagnosis present

## 2011-04-30 DIAGNOSIS — R5381 Other malaise: Secondary | ICD-10-CM | POA: Diagnosis present

## 2011-04-30 DIAGNOSIS — M810 Age-related osteoporosis without current pathological fracture: Secondary | ICD-10-CM | POA: Diagnosis present

## 2011-04-30 DIAGNOSIS — N289 Disorder of kidney and ureter, unspecified: Secondary | ICD-10-CM | POA: Diagnosis present

## 2011-04-30 DIAGNOSIS — Z8673 Personal history of transient ischemic attack (TIA), and cerebral infarction without residual deficits: Secondary | ICD-10-CM

## 2011-04-30 DIAGNOSIS — R531 Weakness: Secondary | ICD-10-CM | POA: Diagnosis present

## 2011-04-30 DIAGNOSIS — K219 Gastro-esophageal reflux disease without esophagitis: Secondary | ICD-10-CM | POA: Diagnosis present

## 2011-04-30 HISTORY — DX: Unspecified malignant neoplasm of skin, unspecified: C44.90

## 2011-04-30 HISTORY — DX: Esophageal obstruction: K22.2

## 2011-04-30 HISTORY — DX: Unspecified atrial fibrillation: I48.91

## 2011-04-30 HISTORY — DX: Other specified diseases of pancreas: K86.89

## 2011-04-30 HISTORY — DX: Transient cerebral ischemic attack, unspecified: G45.9

## 2011-04-30 MED ORDER — ONDANSETRON HCL 4 MG/2ML IJ SOLN
INTRAMUSCULAR | Status: AC
  Administered 2011-05-01: 02:00:00
  Filled 2011-04-30: qty 2

## 2011-04-30 NOTE — ED Notes (Signed)
Today she became weeak and crawled to phone and called son

## 2011-04-30 NOTE — ED Notes (Signed)
A #16 foley inserted to BSD with lg amtof urine out and patientsays she can tell her stomach is feelingbetter

## 2011-04-30 NOTE — ED Notes (Signed)
Pt was brought in by EMS and put in a hall  Bed and we were able to get a pt out of the room and place her in it.  R/T her weakness we did an EKG.  She was so week she became dizzy and sat on bed then slid to floor and crawled to the kitchen and called her son and he called 911

## 2011-05-01 ENCOUNTER — Encounter (HOSPITAL_COMMUNITY): Payer: Self-pay | Admitting: Emergency Medicine

## 2011-05-01 DIAGNOSIS — R531 Weakness: Secondary | ICD-10-CM | POA: Diagnosis present

## 2011-05-01 DIAGNOSIS — N390 Urinary tract infection, site not specified: Secondary | ICD-10-CM | POA: Diagnosis present

## 2011-05-01 LAB — CBC
HCT: 35.4 % — ABNORMAL LOW (ref 36.0–46.0)
Hemoglobin: 11.7 g/dL — ABNORMAL LOW (ref 12.0–15.0)
Hemoglobin: 11.7 g/dL — ABNORMAL LOW (ref 12.0–15.0)
MCH: 29.8 pg (ref 26.0–34.0)
MCHC: 33.1 g/dL (ref 30.0–36.0)
MCV: 90.1 fL (ref 78.0–100.0)
Platelets: 180 10*3/uL (ref 150–400)
Platelets: 184 10*3/uL (ref 150–400)
RBC: 3.93 MIL/uL (ref 3.87–5.11)
RBC: 3.94 MIL/uL (ref 3.87–5.11)
RDW: 13.4 % (ref 11.5–15.5)
WBC: 7 10*3/uL (ref 4.0–10.5)
WBC: 8.4 10*3/uL (ref 4.0–10.5)

## 2011-05-01 LAB — GLUCOSE, CAPILLARY

## 2011-05-01 LAB — BASIC METABOLIC PANEL
BUN: 26 mg/dL — ABNORMAL HIGH (ref 6–23)
CO2: 25 mEq/L (ref 19–32)
Calcium: 8.6 mg/dL (ref 8.4–10.5)
Calcium: 8.9 mg/dL (ref 8.4–10.5)
Chloride: 101 mEq/L (ref 96–112)
Creatinine, Ser: 0.71 mg/dL (ref 0.50–1.10)
GFR calc Af Amer: 84 mL/min — ABNORMAL LOW (ref >90)
GFR calc non Af Amer: 73 mL/min — ABNORMAL LOW (ref >90)
GFR calc non Af Amer: 73 mL/min — ABNORMAL LOW (ref >90)
Glucose, Bld: 122 mg/dL — ABNORMAL HIGH (ref 70–99)
Glucose, Bld: 123 mg/dL — ABNORMAL HIGH (ref 70–99)
Potassium: 3.2 mEq/L — ABNORMAL LOW (ref 3.5–5.1)
Potassium: 3.4 mEq/L — ABNORMAL LOW (ref 3.5–5.1)
Sodium: 137 mEq/L (ref 135–145)
Sodium: 139 mEq/L (ref 135–145)

## 2011-05-01 LAB — DIFFERENTIAL
Basophils Absolute: 0 10*3/uL (ref 0.0–0.1)
Basophils Relative: 0 % (ref 0–1)
Eosinophils Absolute: 0 10*3/uL (ref 0.0–0.7)
Eosinophils Relative: 0 % (ref 0–5)
Lymphocytes Relative: 9 % — ABNORMAL LOW (ref 12–46)
Lymphs Abs: 0.7 10*3/uL (ref 0.7–4.0)
Monocytes Absolute: 0.4 10*3/uL (ref 0.1–1.0)
Monocytes Relative: 5 % (ref 3–12)
Neutro Abs: 7.2 10*3/uL (ref 1.7–7.7)
Neutrophils Relative %: 87 % — ABNORMAL HIGH (ref 43–77)

## 2011-05-01 LAB — URINALYSIS, ROUTINE W REFLEX MICROSCOPIC
Glucose, UA: 500 mg/dL — AB
Specific Gravity, Urine: 1.02 (ref 1.005–1.030)
pH: 6 (ref 5.0–8.0)

## 2011-05-01 LAB — URINE MICROSCOPIC-ADD ON

## 2011-05-01 LAB — TROPONIN I: Troponin I: <0.3 ng/mL (ref <0.30)

## 2011-05-01 MED ORDER — DEXTROSE 5 % IV SOLN: 1.0000 g | Freq: Once | INTRAVENOUS | Status: DC

## 2011-05-01 MED ORDER — METOPROLOL TARTRATE 25 MG PO TABS
25.0000 mg | ORAL_TABLET | Freq: Two times a day (BID) | ORAL | Status: DC
  Administered 2011-05-01 – 2011-05-05 (×9): 25 mg via ORAL
  Filled 2011-05-01 (×10): qty 1

## 2011-05-01 MED ORDER — ACETAMINOPHEN 325 MG PO TABS
650.0000 mg | ORAL_TABLET | Freq: Four times a day (QID) | ORAL | Status: DC | PRN
  Administered 2011-05-01 – 2011-05-04 (×3): 650 mg via ORAL
  Filled 2011-05-01 (×3): qty 2

## 2011-05-01 MED ORDER — ONDANSETRON HCL 4 MG/2ML IJ SOLN: 4.0000 mg | Freq: Four times a day (QID) | INTRAMUSCULAR | Status: DC | PRN

## 2011-05-01 MED ORDER — SODIUM CHLORIDE 0.9 % IV SOLN
Freq: Once | INTRAVENOUS | Status: AC
  Administered 2011-05-01: 09:00:00 via INTRAVENOUS

## 2011-05-01 MED ORDER — SENNA 8.6 MG PO TABS
1.0000 | ORAL_TABLET | Freq: Two times a day (BID) | ORAL | Status: DC
  Administered 2011-05-01 – 2011-05-05 (×9): 8.6 mg via ORAL
  Filled 2011-05-01 (×9): qty 1

## 2011-05-01 MED ORDER — HEPARIN SODIUM (PORCINE) 5000 UNIT/ML IJ SOLN
5000.0000 [IU] | Freq: Three times a day (TID) | INTRAMUSCULAR | Status: DC
  Administered 2011-05-01 – 2011-05-05 (×13): 5000 [IU] via SUBCUTANEOUS
  Filled 2011-05-01 (×19): qty 1

## 2011-05-01 MED ORDER — DEXTROSE 5 % IV SOLN
1.0000 g | INTRAVENOUS | Status: DC
  Administered 2011-05-02 – 2011-05-03 (×2): 1 g via INTRAVENOUS
  Filled 2011-05-01 (×2): qty 10

## 2011-05-01 MED ORDER — AMLODIPINE BESYLATE 5 MG PO TABS
5.0000 mg | ORAL_TABLET | Freq: Every day | ORAL | Status: DC
  Administered 2011-05-01 – 2011-05-05 (×5): 5 mg via ORAL
  Filled 2011-05-01 (×5): qty 1

## 2011-05-01 MED ORDER — ONDANSETRON HCL 4 MG PO TABS: 4.0000 mg | ORAL_TABLET | Freq: Four times a day (QID) | ORAL | Status: DC | PRN

## 2011-05-01 MED ORDER — ASPIRIN 325 MG PO TABS
325.0000 mg | ORAL_TABLET | Freq: Every day | ORAL | Status: DC
  Administered 2011-05-01 – 2011-05-05 (×5): 325 mg via ORAL
  Filled 2011-05-01 (×5): qty 1

## 2011-05-01 MED ORDER — ACETAMINOPHEN 650 MG RE SUPP: 650.0000 mg | Freq: Four times a day (QID) | RECTAL | Status: DC | PRN

## 2011-05-01 MED ORDER — LOSARTAN POTASSIUM 50 MG PO TABS
100.0000 mg | ORAL_TABLET | Freq: Every day | ORAL | Status: DC
  Administered 2011-05-01 – 2011-05-05 (×5): 100 mg via ORAL
  Filled 2011-05-01 (×5): qty 2

## 2011-05-01 MED ORDER — POTASSIUM CHLORIDE CRYS ER 20 MEQ PO TBCR
40.0000 meq | EXTENDED_RELEASE_TABLET | Freq: Once | ORAL | Status: AC
  Administered 2011-05-01: 40 meq via ORAL
  Filled 2011-05-01: qty 2

## 2011-05-01 MED ORDER — DOCUSATE SODIUM 100 MG PO CAPS
100.0000 mg | ORAL_CAPSULE | Freq: Two times a day (BID) | ORAL | Status: DC
  Administered 2011-05-01 – 2011-05-05 (×9): 100 mg via ORAL
  Filled 2011-05-01 (×11): qty 1

## 2011-05-01 MED ORDER — DEXTROSE 5 % IV SOLN
1.0000 g | Freq: Once | INTRAVENOUS | Status: AC
  Administered 2011-05-01: 1 g via INTRAVENOUS

## 2011-05-01 NOTE — ED Provider Notes (Signed)
Medical screening examination/treatment/procedure(s) were conducted as a shared visit with non-physician practitioner(s) and myself.  I personally evaluated the patient during the encounter  Cyndra Numbers, MD 05/01/11 [phone]

## 2011-05-01 NOTE — ED Notes (Signed)
Pt presents with c/o weakness, possible UTI. Is AOx4, states she has increased weakness today.

## 2011-05-01 NOTE — ED Notes (Signed)
To floor at 7:30 per shift change.  Called report at 6:40 and pt in stable condition.  No acute distress noted

## 2011-05-01 NOTE — H&P (Signed)
PCP:   Ruthe Mannan, MD, MD  Confirmed with pt PRIMARY RHEUMATOLOGIST:  Aundra Dubin, M.D.  Chief Complaint:  Generalized weakness  HPI: 89yoF with h/o CVA/TIA, HTN/HL, PAD, PAFib now presents with generalized weakness at home and  found to have UTI, mild pre-renal azotemia.   Pt was in usual state of health, until Monday was at grocery store and at check out line, had  some right hand quivering but no weakness, but this apparently resolved by the time she drove  home and put the groceries away. Then later Monday night, she sat down on bed and "was  completely out of control," unable to stand up because of generalized weakness, without focal  neurological weakness, so eased herself down to the floor. She was on the floor for 2 hours  trying to get off of the floor, and eventually got to the phone to call her nephew.   She denies high spiking fevers, but endorses subjective chills for the past week. Took  temperature Saturday and it was normal. Has been having urinary incontinence and increased  urge, but denies burning or dysuria. No chest pain, SOB, abd pain. Has chronic GERD.    In the ED, vitals were stable. Labs with K 3.4, renal 26/0.71. Trop negative. CBC stable. UA  with 500 glucose, trace ketones, 30 protein, large LE/neg nitrite, too numerous to count WBC's,  many bacteria with only rare squamous. UCx pending. CXR with mild interstitial prominence but  no focal consolidation. CT head negative for bleed or other acute process.   ROS as above, otherwise negative or unremarkable.   Past Medical History  Diagnosis Date  . Hyperlipidemia   . Hypertension   . Fibromyalgia   . Thyroid disease     hyperparathyroid  . PAD (peripheral artery disease)   . CVA (cerebral infarction)     2006  . TIA (transient ischemic attack)   . Atrial fibrillation     Not on Coumadin. Noted during 02/2009 admission.   . Skin cancer     Unknown types  . Schatzki's ring     Non  obstructing ring on EGD 02/2009   . Pancreatic mass 02/2009    Abnormal CT abdomen, with cystic area in head and circumferential thickening of midbody of stomach   Past Surgical History  Procedure Date  . Appendectomy 1940  . Vaginal hysterectomy 1970  . Parathyroidectomy 1980   Medications:  HOME MEDS: Reconciled with a list from family  Prior to Admission medications   Medication Sig Start Date End Date Taking? Authorizing Provider  amLODipine (NORVASC) 10 MG tablet Take 0.5 tablets (5 mg total) by mouth daily. 04/11/11  Yes Ruthe Mannan, MD  Ascorbic Acid (VITAMIN C) 500 MG tablet Take 500 mg by mouth daily.     Yes Historical Provider, MD  aspirin 325 MG tablet Take 325 mg by mouth daily.     Yes Historical Provider, MD  calcium carbonate (OS-CAL) 600 MG TABS Take 600 mg by mouth daily.     Yes Historical Provider, MD  losartan (COZAAR) 100 MG tablet take 1 tablet by mouth once daily 11/01/10  Yes Ruthe Mannan, MD  metoprolol tartrate (LOPRESSOR) 25 MG tablet take 1 tablet by mouth at bedtime 01/02/11  Yes Ruthe Mannan, MD  Multiple Vitamins-Minerals (CENTRUM SILVER PO) Take 1 tablet by mouth daily.     Yes Historical Provider, MD  Omega-3 Fatty Acids (FISH OIL) 300 MG CAPS Take 1 capsule by mouth daily.  Yes Historical Provider, MD  senna (SENOKOT) 8.6 MG tablet Take one tablet two times daily, hold for loose bowels    Yes Historical Provider, MD   Allergies:  No Known Allergies  Social History:   reports that she has never smoked. She does not have any smokeless tobacco history on file. She reports that she does not drink alcohol or use illicit drugs. Still living at home alone, has had 2 husbands, both passed away, last in 09-09-98. Has great niece [name] who is Power of Attorney [phone]. Large family. Still active, still does her yardwork, no cane no walker.   Family History: Family History  Problem Relation Age of Onset  . Cancer Other     breast ca  . Cancer       mouth ca    Physical Exam: Filed Vitals:   05/01/11 0230 05/01/11 0300 05/01/11 0311 05/01/11 0508  BP: 127/49 120/45  140/76  Pulse: 69 71 69 61  Temp:    97.7 F (36.5 C)  TempSrc:    Oral  Resp: 18 17 16    Height:      Weight:      SpO2: 99% 93% 97% 98%   Blood pressure 140/76, pulse 61, temperature 97.7 F (36.5 C), temperature source Oral, resp. rate 16, height 5\' 6"  (1.676 m), weight 53.524 kg (118 lb), SpO2 98.00%.  Gen: Elderly tall, fairly frail appearing F in no distress, pleasant, rambling and moderately  tangential. Able to relate history well HEENT: Pupils round, reactive, EOMI, sclera clear, normal. Mouth moist and not dry appearing Lungs: CTAB no w/c/r, good air movement, overall normal exam Heart: Regular not tachycardic, normal exam overall Abd: Soft, not tender or distended, normal exam Extrem: Quite thin, with decreased bulk but normal tone. Radials easily palpable Neuro: Alert, conversant, pleasant, hard of hearing, CN 2-12 intact, moves upper extremities  spontaneously, able to sit up in bed with a fair bit of assistance, but otherwise grossly non- focal.    Labs & Imaging Results for orders placed during the hospital encounter of 04/30/11 (from the past 48 hour(s))  CBC     Status: Abnormal   Collection Time   04/30/11 11:37 PM      Component Value Range Comment   WBC 8.4  4.0 - 10.5 (K/uL)    RBC 3.93  3.87 - 5.11 (MIL/uL)    Hemoglobin 11.7 (*) 12.0 - 15.0 (g/dL)    HCT 29.5 (*) 62.1 - 46.0 (%)    MCV 90.1  78.0 - 100.0 (fL)    MCH 29.8  26.0 - 34.0 (pg)    MCHC 33.1  30.0 - 36.0 (g/dL)    RDW 30.8  65.7 - 84.6 (%)    Platelets 180  150 - 400 (K/uL)   DIFFERENTIAL     Status: Abnormal   Collection Time   04/30/11 11:37 PM      Component Value Range Comment   Neutrophils Relative 87 (*) 43 - 77 (%)    Neutro Abs 7.2  1.7 - 7.7 (K/uL)    Lymphocytes Relative 9 (*) 12 - 46 (%)    Lymphs Abs 0.7  0.7 - 4.0 (K/uL)    Monocytes Relative 5  3 - 12 (%)     Monocytes Absolute 0.4  0.1 - 1.0 (K/uL)    Eosinophils Relative 0  0 - 5 (%)    Eosinophils Absolute 0.0  0.0 - 0.7 (K/uL)    Basophils  Relative 0  0 - 1 (%)    Basophils Absolute 0.0  0.0 - 0.1 (K/uL)   BASIC METABOLIC PANEL     Status: Abnormal   Collection Time   04/30/11 11:37 PM      Component Value Range Comment   Sodium 137  135 - 145 (mEq/L)    Potassium 3.4 (*) 3.5 - 5.1 (mEq/L)    Chloride 101  96 - 112 (mEq/L)    CO2 25  19 - 32 (mEq/L)    Glucose, Bld 123 (*) 70 - 99 (mg/dL)    BUN 26 (*) 6 - 23 (mg/dL)    Creatinine, Ser 4.09  0.50 - 1.10 (mg/dL)    Calcium 8.9  8.4 - 10.5 (mg/dL)    GFR calc non Af Amer 73 (*) >90 (mL/min)    GFR calc Af Amer 84 (*) >90 (mL/min)   TROPONIN I     Status: Normal   Collection Time   04/30/11 11:37 PM      Component Value Range Comment   Troponin I <0.30  <0.30 (ng/mL)   URINALYSIS, ROUTINE W REFLEX MICROSCOPIC     Status: Abnormal   Collection Time   05/01/11 12:08 AM      Component Value Range Comment   Color, Urine YELLOW  YELLOW     APPearance CLOUDY (*) CLEAR     Specific Gravity, Urine 1.020  1.005 - 1.030     pH 6.0  5.0 - 8.0     Glucose, UA 500 (*) NEGATIVE (mg/dL)    Hgb urine dipstick LARGE (*) NEGATIVE     Bilirubin Urine NEGATIVE  NEGATIVE     Ketones, ur TRACE (*) NEGATIVE (mg/dL)    Protein, ur 30 (*) NEGATIVE (mg/dL)    Urobilinogen, UA 1.0  0.0 - 1.0 (mg/dL)    Nitrite NEGATIVE  NEGATIVE     Leukocytes, UA LARGE (*) NEGATIVE    URINE MICROSCOPIC-ADD ON     Status: Abnormal   Collection Time   05/01/11 12:08 AM      Component Value Range Comment   Squamous Epithelial / LPF RARE  RARE     WBC, UA TOO NUMEROUS TO COUNT  <3 (WBC/hpf)    Bacteria, UA MANY (*) RARE     Casts GRANULAR CAST (*) NEGATIVE    GLUCOSE, CAPILLARY     Status: Abnormal   Collection Time   05/01/11  4:09 AM      Component Value Range Comment   Glucose-Capillary 116 (*) 70 - 99 (mg/dL)    Comment 1 Notify RN      Comment 2 Documented in Chart       Dg Chest 2 View  04/30/2011  *RADIOLOGY REPORT*  Clinical Data: Weakness  CHEST - 2 VIEW  Comparison: None.  Findings: Mild interstitial prominence, may be chronic.  No focal consolidation.  No pleural effusion or pneumothorax. Cardiomediastinal contours are within normal limits.  No acute osseous abnormality.  IMPRESSION: Mild interstitial prominence without focal consolidation.  Original Report Authenticated By: Waneta Martins, M.D.   Ct Head Wo Contrast  05/01/2011  *RADIOLOGY REPORT*  Clinical Data: Weakness and dizziness  CT HEAD WITHOUT CONTRAST  Technique:  Contiguous axial images were obtained from the base of the skull through the vertex without contrast.  Comparison: 05/19/2010  Findings: Atherosclerotic and physiologic intracranial calcifications. Diffuse parenchymal atrophy. Patchy areas of hypoattenuation in deep and periventricular white matter bilaterally.  Old left basal ganglia lacunar infarct.  Negative for acute intracranial hemorrhage, mass lesion, acute infarction, midline shift, or mass-effect. Acute infarct may be inapparent on noncontrast CT. Ventricles and sulci symmetric. Bone windows demonstrate no focal lesion.  IMPRESSION:  1. Negative for bleed or other acute intracranial process.  2. Atrophy and nonspecific white matter changes  Original Report Authenticated By: Thora Lance III, M.D.   ECG: NSR, normal rate, normal axis, narrow QRS, no ST segment deviations, normal T waves, overall normal and not ischemic   Impression Present on Admission:  .Weakness generalized .UTI (lower urinary tract infection)  89yoF with h/o CVA/TIA, HTN/HL, PAD, PAFib now presents with generalized weakness at home and  found to have UTI, mild pre-renal azotemia.   1. UTI: Suspect this is underlying etiology to her generalized weakness.   - Admit observation to regular bed. PT consultation, orthostatics on admit  - Ceftriaxone, f/u UCx. Maintenance IVF's.   2. Renal  insufficiency: BUN/Cr ratio slightly elevated, will give IVF's and trend.   3. HTN: Running hypertense at present, will continue metoprolol and losartan. Continue 325 ASA   Regular bed, [facility] team 4 DNR/DNI, discussed with pt and power of attorney neice   Other plans as per orders.  Mirielle Byrum 05/01/2011, 6:07 AM

## 2011-05-01 NOTE — Progress Notes (Signed)
Patient ID: Pamela [name], female   DOB: 13-Nov-1916, 78 y.o.   MRN: [id number]  Subjective: No events overnight. Patient denies chest pain, shortness of breath, abdominal pain.  Objective:  Vital signs in last 24 hours:  Filed Vitals:   05/01/11 0300 05/01/11 0311 05/01/11 0508 05/01/11 0805  BP: 120/45  140/76 156/61  Pulse: 71 69 61 68  Temp:   97.7 F (36.5 C) 98.7 F (37.1 C)  TempSrc:   Oral Oral  Resp: 17 16  18   Height:      Weight:      SpO2: 93% 97% 98% 93%    Intake/Output from previous day:   Intake/Output Summary (Last 24 hours) at 05/01/11 1520 Last data filed at 05/01/11 0805  Gross per 24 hour  Intake    240 ml  Output    200 ml  Net     40 ml    Physical Exam: General: Alert, awake, oriented x3, in no acute distress. HEENT: No bruits, no goiter. Moist mucous membranes, no scleral icterus, no conjunctival pallor. Heart: Regular rate and rhythm, S1/S2 +, no murmurs, rubs, gallops. Lungs: Clear to auscultation bilaterally. No wheezing, no rhonchi, no rales.  Abdomen: Soft, nontender, nondistended, positive bowel sounds. Extremities: No clubbing or cyanosis, no pitting edema,  positive pedal pulses. Neuro: Grossly nonfocal.  Lab Results:  Basic Metabolic Panel:    Component Value Date/Time   NA 139 05/01/2011 1300   K 3.2* 05/01/2011 1300   CL 104 05/01/2011 1300   CO2 27 05/01/2011 1300   BUN 19 05/01/2011 1300   CREATININE 0.71 05/01/2011 1300   GLUCOSE 122* 05/01/2011 1300   CALCIUM 8.6 05/01/2011 1300   CBC:    Component Value Date/Time   WBC 7.0 05/01/2011 1300   HGB 11.7* 05/01/2011 1300   HCT 35.9* 05/01/2011 1300   PLT 184 05/01/2011 1300   MCV 91.1 05/01/2011 1300   NEUTROABS 7.2 05/04/2011 2337   LYMPHSABS 0.7 05-04-11 2337   MONOABS 0.4 May 04, 2011 2337   EOSABS 0.0 2011-05-04 2337   BASOSABS 0.0 05-04-2011 2337      Lab 05/01/11 1300 05/04/2011 2337  WBC 7.0 8.4  HGB 11.7* 11.7*  HCT 35.9* 35.4*  PLT 184 180  MCV 91.1 90.1  MCH 29.7 29.8  MCHC 32.6  33.1  RDW 13.4 13.4  LYMPHSABS -- 0.7  MONOABS -- 0.4  EOSABS -- 0.0  BASOSABS -- 0.0  BANDABS -- --    Lab 05/01/11 1300 05-04-2011 2337  NA 139 137  K 3.2* 3.4*  CL 104 101  CO2 27 25  GLUCOSE 122* 123*  BUN 19 26*  CREATININE 0.71 0.71  CALCIUM 8.6 8.9  MG -- --   No results found for this basename: INR:5,PROTIME:5 in the last 168 hours Cardiac markers:  Lab 05-04-11 2337  CKMB --  TROPONINI <0.30  MYOGLOBIN --   No components found with this basename: POCBNP:3 No results found for this or any previous visit (from the past 240 hour(s)).  Studies/Results: Dg Chest 2 View 05/04/2011  IMPRESSION: Mild interstitial prominence without focal consolidation.    Ct Head Wo Contrast 05/01/2011   IMPRESSION:  1. Negative for bleed or other acute intracranial process.  2. Atrophy and nonspecific white matter changes    Medications: Scheduled Meds:   . sodium chloride   Intravenous Once  . amLODipine  5 mg Oral Daily  . aspirin  325 mg Oral Daily  . cefTRIAXone (ROCEPHIN)  IV  1 g Intravenous Once  . cefTRIAXone (ROCEPHIN)  IV  1 g Intravenous Q24H  . docusate sodium  100 mg Oral BID  . heparin  5,000 Units Subcutaneous Q8H  . losartan  100 mg Oral Daily  . metoprolol tartrate  25 mg Oral BID  . ondansetron      . senna  1 tablet Oral BID  . DISCONTD: cefTRIAXone (ROCEPHIN)  IV  1 g Intravenous Once   Continuous Infusions:  PRN Meds:.acetaminophen, acetaminophen, ondansetron (ZOFRAN) IV, ondansetron  Assessment/Plan:  Active Problems:  Weakness generalized - pt clinically improving - will continue providing supportive care with IVF and will also continue antibiotics - monitor I's and O's - place PT order   UTI (lower urinary tract infection) - continue antibiotic as noted above   HYPOKALEMIA - supplement as indicated   EDUCATION - test results and diagnostic studies were discussed with patient - patient verbalized the understanding - questions were answered  at the bedside and contact information was provided for additional questions or concerns   LOS: 1 day   MAGICK-MYERS, ISKRA 05/01/2011, 3:20 PM  [facility] Pager: [phone]

## 2011-05-01 NOTE — ED Provider Notes (Signed)
History     CSN: [id number]  Arrival date & time 04/30/11  2104   First MD Initiated Contact with Patient 04/30/11 2221      Chief Complaint  Patient presents with  . Weakness    HPI: Patient is a 78 y.o. female presenting with weakness. The history is provided by the patient.  Weakness The primary symptoms include dizziness. Primary symptoms do not include headaches, syncope, loss of consciousness, altered mental status, fever, nausea or vomiting. The symptoms began 6 to 12 hours ago.  Dizziness also occurs with weakness. Dizziness does not occur with nausea or vomiting.  Additional symptoms include weakness. Medical issues also include cerebral vascular accident and hypertension. Medical issues do not include recent surgery.  This 78 y/o pt reports onset of generalized weakness at approx 1400 today while at grocery store. States symptoms began w/ generalized hand tremors R>L. States tremors seemed to improve by time she arrived home. Was able to perform a few chores then had a sudden onset of generalized weakness that was associated w/ intermittent dizziness. She describes the dizziness as feeling like her "balance was off". At this point pt states she sat on the bed and was unable to get up. States she lowered herself onto the floor and crawled to the phone to call for help which she states took her over an hour. Denies any unilateral neurological symptoms. Admits she has recently noted her urine had a funny color and some increased urgency of urination. Admits to recent tx for sinus infection but denies CP, SOB, fever, nausea, vomiting or diarrhea. Pt lives alone, still drives, shops and mows her own lawn.  Past Medical History  Diagnosis Date  . Hyperlipidemia   . Hypertension   . Fibromyalgia   . Thyroid disease     hyperparathyroid  . PAD (peripheral artery disease)   . CVA (cerebral infarction)   . TIA (transient ischemic attack)     Past Surgical History  Procedure Date  .  Appendectomy 1940  . Vaginal hysterectomy 1970  . Parathyroidectomy 1980    Family History  Problem Relation Age of Onset  . Cancer Other     prostate/breast    History  Substance Use Topics  . Smoking status: Never Smoker   . Smokeless tobacco: Not on file  . Alcohol Use: No    OB History    Grav Para Term Preterm Abortions TAB SAB Ect Mult Living                  Review of Systems  Constitutional: Negative for fever and chills.  HENT: Negative.  Negative for congestion.   Eyes: Negative.   Respiratory: Negative.  Negative for cough.   Cardiovascular: Negative.  Negative for chest pain and syncope.  Gastrointestinal: Negative.  Negative for nausea, vomiting and abdominal pain.  Genitourinary: Positive for urgency. Negative for dysuria.  Musculoskeletal: Negative.   Skin: Negative.   Neurological: Positive for dizziness, tremors and weakness. Negative for loss of consciousness, syncope, speech difficulty, numbness and headaches.  Hematological: Negative.   Psychiatric/Behavioral: Negative.  Negative for altered mental status.    Allergies  Review of patient's allergies indicates no known allergies.  Home Medications   Current Outpatient Rx  Name Route Sig Dispense Refill  . AMLODIPINE BESYLATE 10 MG PO TABS Oral Take 0.5 tablets (5 mg total) by mouth daily. 30 tablet 3  . VITAMIN C 500 MG PO TABS Oral Take 500 mg by mouth daily.      Marland Kitchen  ASPIRIN 325 MG PO TABS Oral Take 325 mg by mouth daily.      Marland Kitchen CALCIUM CARBONATE 600 MG PO TABS Oral Take 600 mg by mouth daily.      Marland Kitchen LOSARTAN POTASSIUM 100 MG PO TABS  take 1 tablet by mouth once daily 30 tablet 6  . METOPROLOL TARTRATE 25 MG PO TABS  take 1 tablet by mouth at bedtime 30 tablet 6  . CENTRUM SILVER PO Oral Take 1 tablet by mouth daily.      Marland Kitchen FISH OIL 300 MG PO CAPS Oral Take 1 capsule by mouth daily.      . SENNOSIDES 8.6 MG PO TABS  Take one tablet two times daily, hold for loose bowels       BP 135/50   Pulse 73  Temp(Src) 98.8 F (37.1 C) (Oral)  Resp 15  Ht 5\' 6"  (1.676 m)  Wt 118 lb (53.524 kg)  BMI 19.05 kg/m2  SpO2 99%  Physical Exam  Constitutional: She is oriented to person, place, and time. She appears well-developed and well-nourished.  HENT:  Head: Normocephalic and atraumatic.  Eyes: Conjunctivae are normal.  Neck: Neck supple.  Cardiovascular: Normal rate and regular rhythm.   Pulmonary/Chest: Effort normal and breath sounds normal.  Abdominal: Soft. Bowel sounds are normal.  Musculoskeletal: Normal range of motion.  Neurological: She is alert and oriented to person, place, and time. She has normal strength. No cranial nerve deficit. GCS eye subscore is 4. GCS verbal subscore is 5. GCS motor subscore is 6.       Pt states she is still unable to stand due to persistent weakness but is otherwise w/o focal neurological deficit  Skin: Skin is warm and dry. No erythema.  Psychiatric: She has a normal mood and affect.    ED Course  Procedures   Date: 05/01/2011  Rate: 81  Rhythm: Sinus rhythm  QRS Axis: normal  Intervals: normal  ST/T Wave abnormalities: normal  Conduction Disutrbances:none  Narrative Interpretation: Bo  Old EKG Reviewed: none available  Findings and clinical impression discussed w/ pt and niece. Discussed need for admission due to persistent weakness. Pt and niece agreeable w/ plan. I have discussed pt w/ Dr Alto Denver who has seen pt and will consult w/ [facility] for admission.   Labs Reviewed  URINALYSIS, ROUTINE W REFLEX MICROSCOPIC - Abnormal; Notable for the following:    APPearance CLOUDY (*)    Glucose, UA 500 (*)    Hgb urine dipstick LARGE (*)    Ketones, ur TRACE (*)    Protein, ur 30 (*)    Leukocytes, UA LARGE (*)    All other components within normal limits  CBC - Abnormal; Notable for the following:    Hemoglobin 11.7 (*)    HCT 35.4 (*)    All other components within normal limits  DIFFERENTIAL - Abnormal; Notable for the  following:    Neutrophils Relative 87 (*)    Lymphocytes Relative 9 (*)    All other components within normal limits  BASIC METABOLIC PANEL - Abnormal; Notable for the following:    Potassium 3.4 (*)    Glucose, Bld 123 (*)    BUN 26 (*)    GFR calc non Af Amer 73 (*)    GFR calc Af Amer 84 (*)    All other components within normal limits  URINE MICROSCOPIC-ADD ON - Abnormal; Notable for the following:    Bacteria, UA MANY (*)    Casts  GRANULAR CAST (*)    All other components within normal limits  TROPONIN I  URINE CULTURE   Dg Chest 2 View  04/30/2011  *RADIOLOGY REPORT*  Clinical Data: Weakness  CHEST - 2 VIEW  Comparison: None.  Findings: Mild interstitial prominence, may be chronic.  No focal consolidation.  No pleural effusion or pneumothorax. Cardiomediastinal contours are within normal limits.  No acute osseous abnormality.  IMPRESSION: Mild interstitial prominence without focal consolidation.  Original Report Authenticated By: Waneta Martins, M.D.   Ct Head Wo Contrast  05/01/2011  *RADIOLOGY REPORT*  Clinical Data: Weakness and dizziness  CT HEAD WITHOUT CONTRAST  Technique:  Contiguous axial images were obtained from the base of the skull through the vertex without contrast.  Comparison: 05/19/2010  Findings: Atherosclerotic and physiologic intracranial calcifications. Diffuse parenchymal atrophy. Patchy areas of hypoattenuation in deep and periventricular white matter bilaterally.  Old left basal ganglia lacunar infarct.  Negative for acute intracranial hemorrhage, mass lesion, acute infarction, midline shift, or mass-effect. Acute infarct may be inapparent on noncontrast CT. Ventricles and sulci symmetric. Bone windows demonstrate no focal lesion.  IMPRESSION:  1. Negative for bleed or other acute intracranial process.  2. Atrophy and nonspecific white matter changes  Original Report Authenticated By: Osa Craver, M.D.     No diagnosis found.    MDM  HPI/PE and  clinical findings c/w 1. UTI 2. Generalized weakness (associated w/ #1)        Roma Kayser Magan Winnett, NP 05/01/11 1049

## 2011-05-01 NOTE — Progress Notes (Signed)
CARE MANAGEMENT NOTE 05/01/2011  Patient:  Pamela [name], Pamela [name]   Account Number:  [id number]  Date Initiated:  05/01/2011  Documentation initiated by:  Odester Nilson  Subjective/Objective Assessment:   plyonephritis ,fever, azotemia, weakness,     Action/Plan:   home   Anticipated DC Date:  05/04/2011   Anticipated DC Plan:  HOME/SELF CARE         Choice offered to / List presented to:             Status of service:  In process, will continue to follow Medicare Important Message given?   (If response is "NO", the following Medicare IM given date fields will be blank) Date Medicare IM given:   Date Additional Medicare IM given:    Discharge Disposition:    Per UR Regulation:  Reviewed for med. necessity/level of care/duration of stay  Comments:  [id number]/Fabienne Nolasco,RN,BSN,CCM

## 2011-05-02 LAB — BASIC METABOLIC PANEL
BUN: 20 mg/dL (ref 6–23)
CO2: 27 mEq/L (ref 19–32)
Chloride: 103 mEq/L (ref 96–112)
Creatinine, Ser: 0.8 mg/dL (ref 0.50–1.10)
GFR calc Af Amer: 72 mL/min — ABNORMAL LOW (ref >90)
Glucose, Bld: 101 mg/dL — ABNORMAL HIGH (ref 70–99)
Potassium: 3.9 mEq/L (ref 3.5–5.1)

## 2011-05-02 LAB — CBC
HCT: 37.4 % (ref 36.0–46.0)
Hemoglobin: 12.2 g/dL (ref 12.0–15.0)
MCV: 92.1 fL (ref 78.0–100.0)
RBC: 4.06 MIL/uL (ref 3.87–5.11)
RDW: 13.7 % (ref 11.5–15.5)
WBC: 6.2 10*3/uL (ref 4.0–10.5)

## 2011-05-02 LAB — URINE CULTURE
Culture  Setup Time: 201302050836
Special Requests: NORMAL

## 2011-05-02 MED ORDER — VITAMINS A & D EX OINT
TOPICAL_OINTMENT | CUTANEOUS | Status: AC
  Administered 2011-05-02: 11:00:00
  Filled 2011-05-02: qty 5

## 2011-05-02 NOTE — Evaluation (Signed)
Physical Therapy Evaluation Patient Details Name: Pamela [name] MRN: [id number] DOB: 08-06-1916 Today's Date: 05/02/2011  Recommend continued PT at SNF   Problem List:  Patient Active Problem List  Diagnoses  . HYPERLIPIDEMIA  . HYPERTENSION  . PAROXYSMAL ATRIAL FIBRILLATION  . UNSPECIFIED PERIPHERAL VASCULAR DISEASE  . ALLERGIC RHINITIS  . HIATAL HERNIA  . CONSTIPATION  . CELLULITIS, FOOT, LEFT  . FIBROMYALGIA  . WOUND, LEG  . CEREBROVASCULAR ACCIDENT, HX OF  . UTI'S, HX OF  . WRIST PAIN, RIGHT  . FACIAL PAIN  . BRADYCARDIA  . Gingivitis  . Weakness generalized  . UTI (lower urinary tract infection)    Past Medical History:  Past Medical History  Diagnosis Date  . Hyperlipidemia   . Hypertension   . Fibromyalgia   . Thyroid disease     hyperparathyroid  . PAD (peripheral artery disease)   . CVA (cerebral infarction)     2006  . TIA (transient ischemic attack)   . Atrial fibrillation     Not on Coumadin. Noted during 02/2009 admission.   . Skin cancer     Unknown types  . Schatzki's ring     Non obstructing ring on EGD 02/2009   . Pancreatic mass 02/2009    Abnormal CT abdomen, with cystic area in head and circumferential thickening of midbody of stomach   Past Surgical History:  Past Surgical History  Procedure Date  . Appendectomy 1940  . Vaginal hysterectomy 1970  . Parathyroidectomy 1980    PT Assessment/Plan/Recommendation PT Assessment Clinical Impression Statement: Previously active independent 78 year old now with weakness from UTI and generalized pain from being on floor for 2 hours after collapsing.  She is having difficulty with intital moblility, but shows good potential to improve with continued PT.   she lives alone and will likely need SNF with rehab there at D/C. PT Recommendation/Assessment: Patient will need skilled PT in the acute care venue PT Problem List: Decreased strength;Decreased activity tolerance;Decreased balance;Decreased  coordination;Decreased knowledge of use of DME Barriers to Discharge: Decreased caregiver support PT Therapy Diagnosis : Generalized weakness;Difficulty walking PT Plan PT Frequency: Min 3X/week PT Treatment/Interventions: DME instruction;Gait training;Functional mobility training;Therapeutic activities;Therapeutic exercise;Balance training PT Recommendation Recommendations for Other Services: OT consult Follow Up Recommendations: Skilled nursing facility Equipment Recommended: Rolling walker with 5" wheels PT Goals  Acute Rehab PT Goals PT Goal Formulation: With patient Time For Goal Achievement: 2 weeks Pt will go Sit to Supine/Side: Independently PT Goal: Sit to Supine/Side - Progress: Goal set today Pt will go Stand to Sit: with modified independence;with upper extremity assist PT Goal: Stand to Sit - Progress: Goal set today Pt will Ambulate: >150 feet;with modified independence;with least restrictive assistive device PT Goal: Ambulate - Progress: Goal set today Pt will Perform Home Exercise Program: with supervision, verbal cues required/provided PT Goal: Perform Home Exercise Program - Progress: Goal set today  PT Evaluation Precautions/Restrictions  Restrictions Weight Bearing Restrictions: No Prior Functioning  Home Living Lives With: Alone Type of Home: House Home Layout: One level Home Access: Stairs to enter Entrance Stairs-Rails: Left Entrance Stairs-Number of Steps: 2 Home Adaptive Equipment: None Additional Comments: pt has been very active, report HOH Prior Function Level of Independence: Independent with gait;Independent with transfers Able to Take Stairs?: Yes Driving: Yes Cognition Cognition Arousal/Alertness: Awake/alert Overall Cognitive Status: Appears within functional limits for tasks assessed Cognition - Other Comments: pt frequently reports problems with hearing Sensation/Coordination Sensation Light Touch: Appears Intact Coordination Gross  Motor  Movements are Fluid and Coordinated: Yes Extremity Assessment RLE Assessment RLE Assessment: Within Functional Limits LLE Assessment LLE Assessment: Within Functional Limits Mobility (including Balance) Bed Mobility Bed Mobility: Yes Supine to Sit: 4: Min assist Transfers Transfers: Yes Sit to Stand: 4: Min assist;With upper extremity assist;With armrests;From bed;From chair/3-in-1 Sit to Stand Details (indicate cue type and reason): pt needs frequent cues to use arms to push up , she had improvement in performance of sit to stand with continued practice and cues Stand to Sit: 4: Min assist Stand to Sit Details: cues to use arms and to control descent Ambulation/Gait Ambulation/Gait: Yes Ambulation/Gait Assistance: 3: Mod assist Ambulation/Gait Assistance Details (indicate cue type and reason): pt had episode of increased tremor of right arm and leg with effort of walking.  She states the exertion greatly fatigues her and she complains of generalized pain and some increased pain transiently in right hip  and low back area Ambulation Distance (Feet): 6 Feet Assistive device: Rolling walker Gait Pattern: Step-to pattern Gait velocity: slow Stairs: No Wheelchair Mobility Wheelchair Mobility: No  Posture/Postural Control Posture/Postural Control: No significant limitations Balance Balance Assessed: No Exercise  Other Exercises Other Exercises: static standing with RW for > 2 minutes End of Session PT - End of Session Equipment Utilized During Treatment: Gait belt;Other (comment) (RW) Activity Tolerance: Patient limited by fatigue Patient left: in chair Nurse Communication: Mobility status for transfers;Mobility status for ambulation General Behavior During Session: [facility] for tasks performed Cognition: [facility] for tasks performed  Donnetta Hail 05/02/2011, 11:25 AM

## 2011-05-02 NOTE — Progress Notes (Signed)
PROGRESS NOTE  Pamela [name]:[id number] DOB: Jan 31, 1917 DOA: 04/30/2011 PCP: Ruthe Mannan, MD, MD  Brief narrative: 78 year old Caucasian female admitted with generalized weakness found to have UTI  Past medical history: Renal insufficiency chronic GERD Schatzki ring per 2010 EGD, CVA  January 2006, 2/2 small vessel disease, paroxysmal atrial fibrillation noted in 2010, hyperparathyroidism, peripheral arterial disease, skin cancer, hyperlipidemia, hypertension, fibromyalgia, possible pancreatic mass seen on CT but not confirmed on EGD  Consultants:  None  Procedures:  CT head 04/30/11-negative for bleed or other acute intracranial process  Chest x-ray 04/30/11-mild interstitial prominence with no evidence of pneumonia  Antibiotics:  Rocephin 04/30/11 >>>   Subjective  Well.  Complaining of dryness of mouth and parched mouth.  No CP/SOb/Dysuria.  No N/V   Objective    Interim History: SLP notes reviewed, PT notes reviewed=NEEDs SNF  Objective: Filed Vitals:   05/02/11 0001 05/02/11 0648 05/02/11 0951 05/02/11 1402  BP:  128/54  142/56  Pulse:  57  62  Temp: 99.1 F (37.3 C) 98.4 F (36.9 C)  97.8 F (36.6 C)  TempSrc: Oral Oral  Oral  Resp:  16  17  Height:      Weight:      SpO2:  95% 97% 99%    Intake/Output Summary (Last 24 hours) at 05/02/11 1646 Last data filed at 05/02/11 8119  Gross per 24 hour  Intake    300 ml  Output   1225 ml  Net   -925 ml    Exam:  General: Pleasant alert oriented Caucasian female in no significant distress Cardiovascular: S1-S2 no murmur rub or gallop. Regular rate and rhythm Respiratory: Clinically clear no added sound Abdomen: Soft nontender non-distended   Data Reviewed: Basic Metabolic Panel:  Lab 05/02/11 1478 05/01/11 1300 04/30/11 2337  NA 138 139 137  K 3.9 3.2* --  CL 103 104 101  CO2 27 27 25   GLUCOSE 101* 122* 123*  BUN 20 19 26*  CREATININE 0.80 0.71 0.71  CALCIUM 9.0 8.6 8.9  MG -- -- --  PHOS -- -- --    Liver Function Tests: No results found for this basename: AST:5,ALT:5,ALKPHOS:5,BILITOT:5,PROT:5,ALBUMIN:5 in the last 168 hours No results found for this basename: LIPASE:5,AMYLASE:5 in the last 168 hours No results found for this basename: AMMONIA:5 in the last 168 hours CBC:  Lab 05/02/11 0325 05/01/11 1300 04/30/11 2337  WBC 6.2 7.0 8.4  NEUTROABS -- -- 7.2  HGB 12.2 11.7* 11.7*  HCT 37.4 35.9* 35.4*  MCV 92.1 91.1 90.1  PLT 184 184 180   Cardiac Enzymes:  Lab 04/30/11 2337  CKTOTAL --  CKMB --  CKMBINDEX --  TROPONINI <0.30   BNP: No components found with this basename: POCBNP:5 CBG:  Lab 05/01/11 0409  GLUCAP 116*    Recent Results (from the past 240 hour(s))  URINE CULTURE     Status: Normal (Preliminary result)   Collection Time   05/01/11 12:08 AM      Component Value Range Status Comment   Specimen Description URINE, CATHETERIZED   Final    Special Requests Normal   Final    Culture  Setup Time [id number]   Final    Colony Count >=100,000 COLONIES/ML   Final    Culture ESCHERICHIA COLI   Final    Report Status PENDING   Incomplete      Studies: Dg Chest 2 View  04/30/2011  *RADIOLOGY REPORT*  Clinical Data: Weakness  CHEST - 2 VIEW  Comparison: None.  Findings: Mild interstitial prominence, may be chronic.  No focal consolidation.  No pleural effusion or pneumothorax. Cardiomediastinal contours are within normal limits.  No acute osseous abnormality.  IMPRESSION: Mild interstitial prominence without focal consolidation.  Original Report Authenticated By: Waneta Martins, M.D.   Ct Head Wo Contrast  05/01/2011  *RADIOLOGY REPORT*  Clinical Data: Weakness and dizziness  CT HEAD WITHOUT CONTRAST  Technique:  Contiguous axial images were obtained from the base of the skull through the vertex without contrast.  Comparison: 05/19/2010  Findings: Atherosclerotic and physiologic intracranial calcifications. Diffuse parenchymal atrophy. Patchy areas of  hypoattenuation in deep and periventricular white matter bilaterally.  Old left basal ganglia lacunar infarct.  Negative for acute intracranial hemorrhage, mass lesion, acute infarction, midline shift, or mass-effect. Acute infarct may be inapparent on noncontrast CT. Ventricles and sulci symmetric. Bone windows demonstrate no focal lesion.  IMPRESSION:  1. Negative for bleed or other acute intracranial process.  2. Atrophy and nonspecific white matter changes  Original Report Authenticated By: Thora Lance III, M.D.    Scheduled Meds:   . amLODipine  5 mg Oral Daily  . aspirin  325 mg Oral Daily  . cefTRIAXone (ROCEPHIN)  IV  1 g Intravenous Q24H  . docusate sodium  100 mg Oral BID  . heparin  5,000 Units Subcutaneous Q8H  . losartan  100 mg Oral Daily  . metoprolol tartrate  25 mg Oral BID  . senna  1 tablet Oral BID  . vitamin A & D       Continuous Infusions:    Assessment/Plan: 1. E. coli urinary tract infection-await culture results and now to antibiotics.   2. Hypokalemia-replace. Repeat bmet as outpatient. 3. Weakness-likely secondary to UTI and overwhelming infection. Recommended status will start search for the same tomorrow. [facility] as they have good experience with this. 4. Paroxysmal atrial fibrillation-currently not on Coumadin but on aspirin. Despite [place] score elevated above 2. Recommend continuation of aspirin. Continue metoprolol 25 mg twice daily 5. Prior history of TIA/CVA 2006-continue aspirin 6. Hypertension-continue Cozaar 100 mg daily and metoprolol 25 twice daily and Norvasc 10 mg daily 7. Osteoporosis continue calcium carbonate 600 mg by mouth  Code Status: Full Family Communication: Discussed with niece in room plan of care Disposition Plan: Needs a SNIF   Pleas Koch, MD  [facility] Pager [phone] 05/02/2011, 4:46 PM    LOS: 2 days

## 2011-05-02 NOTE — Progress Notes (Signed)
Speech Language/Pathology Clinical/Bedside Swallow Evaluation Patient Details  Name: Pamela [name] MRN: [id number] DOB: 1916-09-23 Today's Date: 05/02/2011  Past Medical History:  Past Medical History  Diagnosis Date  . Hyperlipidemia   . Hypertension   . Fibromyalgia   . Thyroid disease     hyperparathyroid  . PAD (peripheral artery disease)   . CVA (cerebral infarction)     2006  . TIA (transient ischemic attack)   . Atrial fibrillation     Not on Coumadin. Noted during 02/2009 admission.   . Skin cancer     Unknown types  . Schatzki's ring     Non obstructing ring on EGD 02/2009   . Pancreatic mass 02/2009    Abnormal CT abdomen, with cystic area in head and circumferential thickening of midbody of stomach   Past Surgical History:  Past Surgical History  Procedure Date  . Appendectomy 1940  . Vaginal hysterectomy 1970  . Parathyroidectomy 1980   HPI:  78 y.o. female admitted from home with UTI, generalized weakness, renal insufficiency.  Pt with hx of CVA, chronic GERD, Schatzki's ring per 2010 EGD.  Pt describes difficulty swallowing pills, having to manually manuever her larynx in order to help them pass.  RN reports occasional coughing with liquids.  CXR:  mild interstitial prominence without focal consolidation.  Recent head CT with no acute intracranial process.  MRI from 2010 revealed multilevel spondylosis and cervical osteophytes.     Assessment/Recommendations/Treatment Plan   Clinical Impression: Pt presents with an overall functional swallow.  She describes chronic difficulty swallowing pills, as well as occasional solid food dysphagia, requiring pt to manipulate her throat manually until the material passes.  Her symptoms are likely attributable to the presence of cervical osteophytes which were identified on 2010 MRI.  Location of osteophytes can create compression on the UES/cervical esophagus, causing retention of solids, particularly pills, above the point of  anatomical compression.  Recommend crushing pills when able and administering with purees to facilitate clearance.  Discussed with pt, who is in agreement.  No further needs identified - SLP to sign off. Risk for Aspiration: Mild   Swallow Evaluation Recommendations Solid Consistency: Regular Liquid Consistency: Thin Liquid Administration via: Cup Medication Administration: Crushed with puree Supervision: Patient able to self feed Postural Changes and/or Swallow Maneuvers: Seated upright 90 degrees;Upright 30-60 min after meal Follow up Recommendations: None       Floyd Lusignan L. Samson Frederic, [place] CCC/SLP Pager [phone]  Blenda Mounts Laurice 05/02/2011,12:46 PM

## 2011-05-03 MED ORDER — LEVOFLOXACIN 500 MG PO TABS
500.0000 mg | ORAL_TABLET | Freq: Every day | ORAL | Status: DC
  Administered 2011-05-03 – 2011-05-05 (×3): 500 mg via ORAL
  Filled 2011-05-03 (×5): qty 1

## 2011-05-03 NOTE — Progress Notes (Signed)
PROGRESS NOTE  Pamela [name] ZOX:[id number] DOB: Jan 29, 1917 DOA: 04/30/2011 PCP: Ruthe Mannan, MD, MD  Brief narrative: 78 year old Caucasian female admitted with generalized weakness found to have UTI  Past medical history: Renal insufficiency chronic GERD Schatzki ring per 2010 EGD, CVA  January 2006, 2/2 small vessel disease, paroxysmal atrial fibrillation noted in 2010, hyperparathyroidism, peripheral arterial disease, skin cancer, hyperlipidemia, hypertension, fibromyalgia, possible pancreatic mass seen on CT but not confirmed on EGD  Consultants:  None  Procedures:  CT head 04/30/11-negative for bleed or other acute intracranial process  Chest x-ray 04/30/11-mild interstitial prominence with no evidence of pneumonia  Urine culture=E.coli with >100,000 CFU , Sensitive to Levofloxacin  Antibiotics:  Rocephin 04/30/11 >>>05/02/2011  levaquin 500 po 2/7>>>05/05/11   Subjective  Well.  Had some gas pains earlier today.  Passed a good BM and better now.  No CP/SOb/Dysuria.  No N/V   Objective    Interim History: SLP notes reviewed, PT notes reviewed=NEED SNF  Objective: Filed Vitals:   05/02/11 2043 05/03/11 0430 05/03/11 0908 05/03/11 0912  BP: 134/51 144/68 127/61 127/61  Pulse: 67 60  62  Temp: 98.2 F (36.8 C) 98 F (36.7 C)    TempSrc: Oral Oral    Resp: 16 17    Height:      Weight:      SpO2: 96% 99%      Intake/Output Summary (Last 24 hours) at 05/03/11 1146 Last data filed at 05/03/11 0910  Gross per 24 hour  Intake    360 ml  Output    725 ml  Net   -365 ml    Exam:  General: Pleasant alert oriented Caucasian female in no significant distress Cardiovascular: S1-S2 no murmur rub or gallop. Regular rate and rhythm Respiratory: Clinically clear no added sound Abdomen: Soft nontender non-distended   Data Reviewed: Basic Metabolic Panel:  Lab 05/02/11 8119 05/01/11 1300 04/30/11 2337  NA 138 139 137  K 3.9 3.2* --  CL 103 104 101  CO2 27 27 25     GLUCOSE 101* 122* 123*  BUN 20 19 26*  CREATININE 0.80 0.71 0.71  CALCIUM 9.0 8.6 8.9  MG -- -- --  PHOS -- -- --   Liver Function Tests: No results found for this basename: AST:5,ALT:5,ALKPHOS:5,BILITOT:5,PROT:5,ALBUMIN:5 in the last 168 hours No results found for this basename: LIPASE:5,AMYLASE:5 in the last 168 hours No results found for this basename: AMMONIA:5 in the last 168 hours CBC:  Lab 05/02/11 0325 05/01/11 1300 04/30/11 2337  WBC 6.2 7.0 8.4  NEUTROABS -- -- 7.2  HGB 12.2 11.7* 11.7*  HCT 37.4 35.9* 35.4*  MCV 92.1 91.1 90.1  PLT 184 184 180   Cardiac Enzymes:  Lab 04/30/11 2337  CKTOTAL --  CKMB --  CKMBINDEX --  TROPONINI <0.30   BNP: No components found with this basename: POCBNP:5 CBG:  Lab 05/01/11 0409  GLUCAP 116*    Recent Results (from the past 240 hour(s))  URINE CULTURE     Status: Normal   Collection Time   05/01/11 12:08 AM      Component Value Range Status Comment   Specimen Description URINE, CATHETERIZED   Final    Special Requests Normal   Final    Culture  Setup Time [id number]   Final    Colony Count >=100,000 COLONIES/ML   Final    Culture ESCHERICHIA COLI   Final    Report Status 05/02/2011 FINAL   Final    Organism ID, Bacteria  ESCHERICHIA COLI   Final      Studies: Dg Chest 2 View  04/30/2011  *RADIOLOGY REPORT*  Clinical Data: Weakness  CHEST - 2 VIEW  Comparison: None.  Findings: Mild interstitial prominence, may be chronic.  No focal consolidation.  No pleural effusion or pneumothorax. Cardiomediastinal contours are within normal limits.  No acute osseous abnormality.  IMPRESSION: Mild interstitial prominence without focal consolidation.  Original Report Authenticated By: Waneta Martins, M.D.   Ct Head Wo Contrast  05/01/2011  *RADIOLOGY REPORT*  Clinical Data: Weakness and dizziness  CT HEAD WITHOUT CONTRAST  Technique:  Contiguous axial images were obtained from the base of the skull through the vertex without  contrast.  Comparison: 05/19/2010  Findings: Atherosclerotic and physiologic intracranial calcifications. Diffuse parenchymal atrophy. Patchy areas of hypoattenuation in deep and periventricular white matter bilaterally.  Old left basal ganglia lacunar infarct.  Negative for acute intracranial hemorrhage, mass lesion, acute infarction, midline shift, or mass-effect. Acute infarct may be inapparent on noncontrast CT. Ventricles and sulci symmetric. Bone windows demonstrate no focal lesion.  IMPRESSION:  1. Negative for bleed or other acute intracranial process.  2. Atrophy and nonspecific white matter changes  Original Report Authenticated By: Thora Lance III, M.D.    Scheduled Meds:    . amLODipine  5 mg Oral Daily  . aspirin  325 mg Oral Daily  . docusate sodium  100 mg Oral BID  . heparin  5,000 Units Subcutaneous Q8H  . levofloxacin  500 mg Oral Daily  . losartan  100 mg Oral Daily  . metoprolol tartrate  25 mg Oral BID  . senna  1 tablet Oral BID  . DISCONTD: cefTRIAXone (ROCEPHIN)  IV  1 g Intravenous Q24H   Continuous Infusions:    Assessment/Plan: 1. E. coli urinary tract infection-culture results show sensitive to Levaquin 500mg .  If no fever or chills will d/c to SNF   2. Hypokalemia-replace. Repeat bmet as outpatient. 3. Weakness-likely secondary to UTI and overwhelming infection. Recommended SNF will start search for the same tomorrow. [facility] as they have good experience with this. 4. Paroxysmal atrial fibrillation-currently not on Coumadin but on aspirin. Despite [place] score elevated above 2. Recommend continuation of aspirin. Continue metoprolol 25 mg twice daily 5. Prior history of TIA/CVA 2006-continue aspirin. 6. Hypertension-continue Cozaar 100 mg daily and metoprolol 25 twice daily and Norvasc 10 mg daily 7. Osteoporosis- continue calcium carbonate 600 mg by mouth  Code Status: Full Family Communication: Discussed with niece [phone].  Will  likely discharge to SNF when available Disposition Plan: Needs a SNIF   Pleas Koch, MD  [facility] Pager [phone] 05/03/2011, 11:46 AM    LOS: 3 days

## 2011-05-03 NOTE — Progress Notes (Signed)
Physical Therapy Treatment Patient Details Name: Pamela [name] MRN: [id number] DOB: 1916-05-15 Today's Date: 05/03/2011  PT Assessment/Plan  PT - Assessment/Plan Comments on Treatment Session: pt improved today with mobility and tolerance  pt would still benefit from SNF prior to return home PT Plan: Discharge plan remains appropriate PT Frequency: Min 3X/week Follow Up Recommendations: Skilled nursing facility Equipment Recommended: Rolling walker with 5" wheels PT Goals  Acute Rehab PT Goals PT Goal: Sit to Supine/Side - Progress: Progressing toward goal PT Goal: Stand to Sit - Progress: Progressing toward goal PT Goal: Ambulate - Progress: Progressing toward goal PT Goal: Perform Home Exercise Program - Progress: Progressing toward goal  PT Treatment Precautions/Restrictions  Precautions Precautions: Fall Required Braces or Orthoses: No Restrictions Weight Bearing Restrictions: No Mobility (including Balance) Bed Mobility Bed Mobility: Yes Supine to Sit: 4: Min assist Supine to Sit Details (indicate cue type and reason): pt instructed to roll to right to get to sitting Transfers Transfers: Yes Sit to Stand: 4: Min assist;With upper extremity assist;With armrests;From bed;From chair/3-in-1 Sit to Stand Details (indicate cue type and reason): needs extra time at first Stand to Sit: 5: Supervision;With armrests;With upper extremity assist Ambulation/Gait Ambulation/Gait: Yes Ambulation/Gait Assistance: 4: Min assist Ambulation/Gait Assistance Details (indicate cue type and reason): pt improved with ambulation.  She said right leg felt "funny" but she did not have the tremor she did yesterday Ambulation Distance (Feet): 200 Feet Assistive device: Rolling walker Gait Pattern: Step-through pattern Gait velocity: improved velocity today Stairs: No Wheelchair Mobility Wheelchair Mobility: No  Posture/Postural Control Posture/Postural Control: No significant  limitations Balance Balance Assessed: No Exercise  General Exercises - Lower Extremity Ankle Circles/Pumps: AROM;10 reps;Supine Short Arc Quad: AROM;Both;10 reps;Supine Long Arc Quad: Both;10 reps;PROM Heel Slides: AROM;5 reps;Supine End of Session PT - End of Session Equipment Utilized During Treatment: Gait belt;Other (comment) (RW) Activity Tolerance: Patient tolerated treatment well Patient left: Other (comment) (in bathroom, nursing tech in room) Nurse Communication: Mobility status for transfers;Mobility status for ambulation General Behavior During Session: [facility] for tasks performed Cognition: [facility] for tasks performed  Donnetta Hail 05/03/2011, 1:41 PM

## 2011-05-04 MED ORDER — LEVOFLOXACIN 500 MG PO TABS: 500.0000 mg | ORAL_TABLET | Freq: Every day | ORAL | Status: AC

## 2011-05-04 NOTE — Progress Notes (Signed)
Patient set to discharge to [facility] tomorrow, Saturday 05/05/11. Chart copy packet complete along with d/c summary, med reconciliation & yellow DNR. Patient's niece, [name] to transport in the am via car.   Unice Bailey, LCSWA [phone]

## 2011-05-04 NOTE — Progress Notes (Signed)
CSW spoke with patient & niece, Pamela [name] (cell#: [phone] home#: [phone]) re: discharge planning. Patient was requesting [facility], but they are not contracted with her insurance. CSW faxed information out to [facility], niece stated they would like [facility]. Awaiting bed offer.   Pamela [name], LCSWA [phone]

## 2011-05-04 NOTE — Discharge Summary (Addendum)
[facility] Hospital Discharge Summary  Date of Admission: 04/30/2011  9:14 PM Admitter: @ADMITPROV @   Date of Discharge2/10/2011 Attending Physician: Pleas Koch, MD  PCP: Ruthe Mannan, MD, MD   Brief narrative:  78 year old Caucasian female admitted 05/01/11 with generalized weakness found to have UTI.  Became out-of-control unable to stand her generalized weakness without any focal findings. Fell to the floor and then eventually was able to get up and called her nephew. Note chills and urinary incontinence increased urge, no dysuria  In the ED, vitals were stable. Labs with K 3.4, renal 26/0.71. Trop negative. CBC stable. UA  with 500 glucose, trace ketones, 30 protein, large LE/neg nitrite, too numerous to count WBC's,  many bacteria with only rare squamous. UCx pending. CXR with mild interstitial prominence but  no focal consolidation. CT head negative for bleed or other acute process   Past medical history:  Renal insufficiency chronic GERD Schatzki ring per 2010 EGD, CVA January 2006, 2/2 small vessel disease, paroxysmal atrial fibrillation noted in 2010, hyperparathyroidism, peripheral arterial disease, skin cancer, hyperlipidemia, hypertension, fibromyalgia, possible pancreatic mass seen on CT but not confirmed on EGD   Consultants:  None  Procedures:  CT head 04/30/11-negative for bleed or other acute intracranial process  Chest x-ray 04/30/11-mild interstitial prominence with no evidence of pneumonia  Urine culture=E.coli with >100,000 CFU , Sensitive to Levofloxacin  Antibiotics:  Rocephin 04/30/11 >>>05/02/2011  levaquin 500 po 2/7>>>05/05/11  Hospital Course by problem list: Assessment/Plan:  1. E. coli urinary tract infection-urine culture culture results show sensitive to Levaquin 500mg . she will need one to 2 more days of Levaquin and should have frequent voiding to prevent stasis of urine  2. Hypokalemia-replace, and hospital and will need Repeat bmet as  outpatient. 3. Weakness-likely secondary to UTI and overwhelming infection. Physical therapy saw the patient in hospital and recommended the patient follow up for short-term rehabilitation. Is getting her care arranged and sort. And she is agreeable for short-term facility for rehabilitation purposes 4. Paroxysmal atrial fibrillation-currently not on Coumadin but on aspirin. Despite Chads score elevated above 2, and with recent fall would not recommend systemic anticoagulation- Recommend continuation of aspirin. Continue metoprolol 25 mg twice daily.   5. Prior history of TIA/CVA 2006-continue aspirin. 6. Hypertension-continue Cozaar 100 mg daily and metoprolol 25 twice daily and Norvasc 10 mg daily 7. Osteoporosis- continue calcium carbonate 600 mg by mouth   LOS: 4 days     Procedures Performed and pertinent labs: Dg Chest 2 View  04/30/2011  *RADIOLOGY REPORT*  Clinical Data: Weakness  CHEST - 2 VIEW  Comparison: None.  Findings: Mild interstitial prominence, may be chronic.  No focal consolidation.  No pleural effusion or pneumothorax. Cardiomediastinal contours are within normal limits.  No acute osseous abnormality.  IMPRESSION: Mild interstitial prominence without focal consolidation.  Original Report Authenticated By: Waneta Martins, M.D.   Ct Head Wo Contrast  05/01/2011  *RADIOLOGY REPORT*  Clinical Data: Weakness and dizziness  CT HEAD WITHOUT CONTRAST  Technique:  Contiguous axial images were obtained from the base of the skull through the vertex without contrast.  Comparison: 05/19/2010  Findings: Atherosclerotic and physiologic intracranial calcifications. Diffuse parenchymal atrophy. Patchy areas of hypoattenuation in deep and periventricular white matter bilaterally.  Old left basal ganglia lacunar infarct.  Negative for acute intracranial hemorrhage, mass lesion, acute infarction, midline shift, or mass-effect. Acute infarct may be inapparent on noncontrast CT. Ventricles and sulci  symmetric. Bone windows demonstrate no focal lesion.  IMPRESSION:  1. Negative for bleed or other acute intracranial process.  2. Atrophy and nonspecific white matter changes  Original Report Authenticated By: Osa Craver, M.D.    Discharge Vitals & PE:  BP 134/65  Pulse 60  Temp(Src) 98.5 F (36.9 C) (Oral)  Resp 16  Ht 5\' 6"  (1.676 m)  Wt 53.524 kg (118 lb)  BMI 19.05 kg/m2  SpO2 93% Alert oriented in no specific distress. States that she had a sequence night and did not feel strong today as she did yesterday on her feet no other general complaint Arcus mild JVD no Chest clear to clear now@ S1-S2 no murmur or gallop Abdomen soft nontender Neurological grossly intact  Discharge Labs: No results found for this or any previous visit (from the past 24 hour(s)).  Disposition and follow-up:   Ms.Zo ANNER BAITY was discharged from in good condition.    Follow-up Appointments: Discharge Orders    Future Orders Please Complete By Expires   Diet - low sodium heart healthy      Increase activity slowly      Call MD for:  difficulty breathing, headache or visual disturbances      Call MD for:  redness, tenderness, or signs of infection (pain, swelling, redness, odor or green/yellow discharge around incision site)      Call MD for:  severe uncontrolled pain      Call MD for:  temperature >100.4         Discharge Medications: Medication List  As of 05/04/2011 12:25 PM   TAKE these medications         amLODipine 10 MG tablet   Commonly known as: NORVASC   Take 0.5 tablets (5 mg total) by mouth daily.      aspirin 325 MG tablet   Take 325 mg by mouth daily.      calcium carbonate 600 MG Tabs   Commonly known as: OS-CAL   Take 600 mg by mouth daily.      CENTRUM SILVER PO   Take 1 tablet by mouth daily.      Fish Oil 300 MG Caps   Take 1 capsule by mouth daily.      levofloxacin 500 MG tablet   Commonly known as: LEVAQUIN   Take 1 tablet (500 mg total) by mouth  daily.      losartan 100 MG tablet   Commonly known as: COZAAR   take 1 tablet by mouth once daily      metoprolol tartrate 25 MG tablet   Commonly known as: LOPRESSOR   take 1 tablet by mouth at bedtime      senna 8.6 MG tablet   Commonly known as: SENOKOT   Take one tablet two times daily, hold for loose bowels      vitamin C 500 MG tablet   Commonly known as: ASCORBIC ACID   Take 500 mg by mouth daily.           Medications Discontinued During This Encounter  Medication Reason  . cefTRIAXone (ROCEPHIN) 1 g in dextrose 5 % 50 mL IVPB   . cefTRIAXone (ROCEPHIN) 1 g in dextrose 5 % 50 mL IVPB    Recommend frequent voiding as an outpatient  Recommended basic metabolic panel 3-5 days   Signed: Pleas Koch 05/04/2011, 12:25 PM    Addendsum-patient stayed overnight 05/04/11 and was seen in follow-up 05/05/11--She had no specific c/o other than feeling weak from non-mobility Alert cta b s1 s2 no m/r/g  Plan-as per above LM for social worker to coordinate with niece who will be transporting her to Facility

## 2011-05-05 NOTE — Progress Notes (Signed)
D/ced foley per order. Called [facility] RN to give report and to notify that pt still due to void.  Discussed d/c meds and instructions with pt and niece, who verbalized understanding.  Sent d/c packet with pt and niece to [facility].

## 2011-05-05 NOTE — Progress Notes (Signed)
Patient discharged to [facility] today. Niece, [name] transported via car. Facility made aware.   Unice Bailey, LCSWA [phone]

## 2011-06-06 ENCOUNTER — Other Ambulatory Visit: Payer: Self-pay | Admitting: Family Medicine

## 2011-06-06 DIAGNOSIS — E785 Hyperlipidemia, unspecified: Secondary | ICD-10-CM

## 2011-06-06 DIAGNOSIS — I10 Essential (primary) hypertension: Secondary | ICD-10-CM

## 2011-06-11 ENCOUNTER — Encounter: Payer: Self-pay | Admitting: Family Medicine

## 2011-06-11 ENCOUNTER — Other Ambulatory Visit: Payer: Medicare Other

## 2011-06-11 ENCOUNTER — Ambulatory Visit (INDEPENDENT_AMBULATORY_CARE_PROVIDER_SITE_OTHER): Payer: Medicare Other | Admitting: Family Medicine

## 2011-06-11 VITALS — BP 130/70 | HR 60 | Temp 97.8°F | Wt 116.0 lb

## 2011-06-11 DIAGNOSIS — Z0000 Encounter for general adult medical examination without abnormal findings: Secondary | ICD-10-CM

## 2011-06-11 DIAGNOSIS — N390 Urinary tract infection, site not specified: Secondary | ICD-10-CM

## 2011-06-11 DIAGNOSIS — Z136 Encounter for screening for cardiovascular disorders: Secondary | ICD-10-CM

## 2011-06-11 DIAGNOSIS — N12 Tubulo-interstitial nephritis, not specified as acute or chronic: Secondary | ICD-10-CM

## 2011-06-11 LAB — BASIC METABOLIC PANEL
BUN: 20 mg/dL (ref 6–23)
Creatinine, Ser: 0.8 mg/dL (ref 0.4–1.2)
GFR: 75.53 mL/min (ref >60.00)

## 2011-06-11 LAB — POCT URINALYSIS DIPSTICK
Bilirubin, UA: NEGATIVE
Glucose, UA: NEGATIVE
Nitrite, UA: NEGATIVE
Spec Grav, UA: 1.01

## 2011-06-11 LAB — LIPID PANEL: Cholesterol: 259 mg/dL — ABNORMAL HIGH (ref 0–200)

## 2011-06-11 LAB — LDL CHOLESTEROL, DIRECT: Direct LDL: 175.9 mg/dL

## 2011-06-11 NOTE — Patient Instructions (Signed)
Good to see you. We will call you with results of your urine culture.

## 2011-06-11 NOTE — Progress Notes (Signed)
78 year old female here for hospital follow up.  Notes reviewed- admitted 05/01/11 with generalized weakness found to have UTI/pyelo.  In the ED, vitals were stable. Labs with K 3.4, renal 26/0.71. Trop negative. CBC stable. UA  with 500 glucose, trace ketones, 30 protein, large LE/neg nitrite, too numerous to count WBC's,  many bacteria with only rare squamous. UCx pending. CXR with mild interstitial prominence but  no focal consolidation. CT head negative for bleed or other acute process   CT head 04/30/11-negative for bleed or other acute intracranial process  Chest x-ray 04/30/11-mild interstitial prominence with no evidence of pneumonia  Urine culture=E.coli with >100,000 CFU , Sensitive to Levofloxacin Antibiotics:  Rocephin 04/30/11 >>>05/02/2011  levaquin 500 po 2/7>>>05/05/11  Due to deconditioning, had to go to SNF for rehab for several weeks after admission.  Doing well. No dysuria or increased urinary frequency. No n/v/d.  ROS: See HPI  Patient Active Problem List  Diagnoses  . HYPERLIPIDEMIA  . HYPERTENSION  . PAROXYSMAL ATRIAL FIBRILLATION  . UNSPECIFIED PERIPHERAL VASCULAR DISEASE  . ALLERGIC RHINITIS  . HIATAL HERNIA  . CONSTIPATION  . CELLULITIS, FOOT, LEFT  . FIBROMYALGIA  . WOUND, LEG  . CEREBROVASCULAR ACCIDENT, HX OF  . UTI'S, HX OF  . WRIST PAIN, RIGHT  . FACIAL PAIN  . BRADYCARDIA  . Gingivitis  . Weakness generalized  . UTI (lower urinary tract infection)  . Pyelonephritis  . Routine general medical examination at a health care facility   Past Medical History  Diagnosis Date  . Hyperlipidemia   . Hypertension   . Fibromyalgia   . Thyroid disease     hyperparathyroid  . PAD (peripheral artery disease)   . CVA (cerebral infarction)     2006  . TIA (transient ischemic attack)   . Atrial fibrillation     Not on Coumadin. Noted during 02/2009 admission.   . Skin cancer     Unknown types  . Schatzki's ring     Non obstructing ring on EGD 02/2009     . Pancreatic mass 02/2009    Abnormal CT abdomen, with cystic area in head and circumferential thickening of midbody of stomach   Past Surgical History  Procedure Date  . Appendectomy 1940  . Vaginal hysterectomy 1970  . Parathyroidectomy 1980   History  Substance Use Topics  . Smoking status: Never Smoker   . Smokeless tobacco: Never Used  . Alcohol Use: No   Family History  Problem Relation Age of Onset  . Cancer Other     breast ca  . Cancer      mouth ca    No Known Allergies Current Outpatient Prescriptions on File Prior to Visit  Medication Sig Dispense Refill  . amLODipine (NORVASC) 10 MG tablet Take 0.5 tablets (5 mg total) by mouth daily.  30 tablet  3  . Ascorbic Acid (VITAMIN C) 500 MG tablet Take 500 mg by mouth daily.        Marland Kitchen aspirin 325 MG tablet Take 325 mg by mouth daily.        . calcium carbonate (OS-CAL) 600 MG TABS Take 600 mg by mouth daily.        Marland Kitchen losartan (COZAAR) 100 MG tablet take 1 tablet by mouth once daily  30 tablet  6  . metoprolol tartrate (LOPRESSOR) 25 MG tablet take 1 tablet by mouth at bedtime  30 tablet  6  . Multiple Vitamins-Minerals (CENTRUM SILVER PO) Take 1 tablet by mouth daily.        Marland Kitchen  Omega-3 Fatty Acids (FISH OIL) 300 MG CAPS Take 1 capsule by mouth daily.        Marland Kitchen senna (SENOKOT) 8.6 MG tablet Take one tablet two times daily, hold for loose bowels        The PMH, PSH, Social History, Family History, Medications, and allergies have been reviewed in [facility], and have been updated if relevant.  Physical exam: BP 130/70  Pulse 60  Temp(Src) 97.8 F (36.6 C) (Oral)  Wt 116 lb (52.617 kg)  General: elderly female in NAD  Head: Large hematoma over right maxillary bone with focal tenderness, per pt and her niece, smaller in size  Eyes: No corneal or conjunctival inflammation noted. EOMI. Perrla. Funduscopic exam benign, without hemorrhages, exudates or papilledema. Vision grossly normal.  Mouth: MMM  Neck: extensive brusing on  neck and chin  Lungs: Normal respiratory effort, chest expands symmetrically. Lungs are clear to auscultation, no crackles or wheezes.  Heart: Normal rate and regular rhythm. S1 and S2 normal without gallop, murmur, click, rub or other extra sounds.  Psych: Oriented X3, memory intact for recent and remote, normally interactive, good eye contact, not anxious appearing, not depressed appearing, and not agitated.    Assessment and Plan:  1. Pyelonephritis  POCT urinalysis dipstick, Urine culture  Improved. UA with trace LE and trace blood. Will send urine for cx prior to treating with abx. The patient indicates understanding of these issues and agrees with the plan.

## 2011-06-13 LAB — URINE CULTURE
Colony Count: NO GROWTH
Organism ID, Bacteria: NO GROWTH

## 2011-06-21 ENCOUNTER — Ambulatory Visit (INDEPENDENT_AMBULATORY_CARE_PROVIDER_SITE_OTHER): Payer: Medicare Other | Admitting: Family Medicine

## 2011-06-21 ENCOUNTER — Encounter: Payer: Self-pay | Admitting: Family Medicine

## 2011-06-21 VITALS — BP 130/78 | HR 68 | Temp 97.6°F | Wt 115.0 lb

## 2011-06-21 DIAGNOSIS — Z0000 Encounter for general adult medical examination without abnormal findings: Secondary | ICD-10-CM

## 2011-06-21 DIAGNOSIS — I10 Essential (primary) hypertension: Secondary | ICD-10-CM

## 2011-06-21 DIAGNOSIS — E785 Hyperlipidemia, unspecified: Secondary | ICD-10-CM

## 2011-06-21 MED ORDER — PANTOPRAZOLE SODIUM 40 MG PO TBEC: 40.0000 mg | DELAYED_RELEASE_TABLET | Freq: Every day | ORAL | Status: DC

## 2011-06-21 MED ORDER — METOPROLOL TARTRATE 25 MG PO TABS: ORAL_TABLET | ORAL | Status: DC

## 2011-06-21 NOTE — Progress Notes (Signed)
Subjective:    Patient ID: Pamela [name], female    DOB: 03/31/1916, 78 y.o.   MRN: [id number]  HPI 78 year old female with history of PAF, HTN, hx of CVA here for medicare annual wellness visit.  I have personally reviewed the Medicare Annual Wellness questionnaire and have noted 1. The patient's medical and social history 2. Their use of alcohol, tobacco or illicit drugs 3. Their current medications and supplements 4. The patient's functional ability including ADL's, fall risks, home safety risks and hearing or visual             impairment. 5. Diet and physical activities 6. Evidence for depression or mood disorders      HTN- taking amlodpine to 10 mg daily last month and  Metoprolol 25 mg daily.   Brings in BP log, normotensive at home. BP Readings from Last 3 Encounters:  06/11/11 130/70  05/05/11 126/66  11/30/10 132/64    Lab Results  Component Value Date   CREATININE 0.8 06/11/2011    Admitted for pylelonephritis last month. Symptoms resolved and urine cx last week was neg.   Lab Results  Component Value Date   CHOL 259* 06/11/2011   HDL 62.40 06/11/2011   LDLCALC  Value: 122        Total Cholesterol/HDL:CHD Risk Coronary Heart Disease Risk Table                     Men   Women  1/2 Average Risk   3.4   3.3  Average Risk       5.0   4.4  2 X Average Risk   9.6   7.1  3 X Average Risk  23.4   11.0        Use the calculated Patient Ratio above and the CHD Risk Table to determine the patient's CHD Risk.        ATP III CLASSIFICATION (LDL):  <100     mg/dL   Optimal  409-811  mg/dL   Near or Above                    Optimal  130-159  mg/dL   Borderline  914-782  mg/dL   High  >956     mg/dL   Very High* 21/30/8657   LDLDIRECT 175.9 06/11/2011   TRIG 141.0 06/11/2011   CHOLHDL 4 06/11/2011     Review of Systems      See HPI Patient reports no  vision/ hearing changes,anorexia, weight change, fever ,adenopathy, persistant / recurrent hoarseness, swallowing issues, chest  pain, edema,persistant / recurrent cough, hemoptysis, dyspnea(rest, exertional, paroxysmal nocturnal), gastrointestinal  bleeding (melena, rectal bleeding), abdominal pain, excessive heart burn, GU symptoms(dysuria, hematuria, pyuria, voiding/incontinence  Issues) syncope, focal weakness, severe memory loss, concerning skin lesions, depression, anxiety, abnormal bruising/bleeding, major joint swelling, breast masses or abnormal vaginal bleeding.       Objective:   Physical Exam BP 130/78  Pulse 68  Temp(Src) 97.6 F (36.4 C) (Oral)  Wt 115 lb (52.164 kg)     General:  elderly female in NAD  Head:  Large hematoma over right maxillary bone with focal tenderness, per pt and her niece, smaller in size Eyes:  No corneal or conjunctival inflammation noted. EOMI. Perrla. Funduscopic exam benign, without hemorrhages, exudates or papilledema. Vision grossly normal. Mouth:  MMM Neck:  extensive brusing on neck and chin Lungs:  Normal respiratory effort, chest expands symmetrically. Lungs are clear to  auscultation, no crackles or wheezes. Heart:  Normal rate and regular rhythm. S1 and S2 normal without gallop, murmur, click, rub or other extra sounds. Msk:  right wrist contusion radial aspect, FROM of wrist Psych:  Oriented X3, memory intact for recent and remote, normally interactive, good eye contact, not anxious appearing, not depressed appearing, and not agitated.        Assessment & Plan:   1. Routine general medical examination at a health care facility  The patients weight, height, BMI and visual acuity have been recorded in the chart I have made referrals, counseling and provided education to the patient based review of the above and I have provided the pt with a written personalized care plan for preventive services.   2. HYPERLIPIDEMIA  HDL and TG excellent but LDL increased. Discussed with pt and her niece and they have opted not to treat it with medication.  3. HYPERTENSION    Stable, continue current meds.

## 2011-07-25 ENCOUNTER — Ambulatory Visit (INDEPENDENT_AMBULATORY_CARE_PROVIDER_SITE_OTHER): Payer: Medicare Other | Admitting: Family Medicine

## 2011-07-25 ENCOUNTER — Encounter: Payer: Self-pay | Admitting: Family Medicine

## 2011-07-25 VITALS — BP 140/62 | HR 72 | Temp 97.8°F | Wt 118.0 lb

## 2011-07-25 DIAGNOSIS — N12 Tubulo-interstitial nephritis, not specified as acute or chronic: Secondary | ICD-10-CM

## 2011-07-25 DIAGNOSIS — Z87448 Personal history of other diseases of urinary system: Secondary | ICD-10-CM

## 2011-07-25 LAB — POCT URINALYSIS DIPSTICK
Blood, UA: NEGATIVE
Glucose, UA: NEGATIVE
Nitrite, UA: NEGATIVE
Urobilinogen, UA: NEGATIVE

## 2011-07-25 MED ORDER — CEPHALEXIN 500 MG PO CAPS: 500.0000 mg | ORAL_CAPSULE | Freq: Two times a day (BID) | ORAL | Status: AC

## 2011-07-25 NOTE — Progress Notes (Signed)
78 year old female here ? UTI  H/o recurrent UTIs and hospital admission in 04/2011 for pyelo. Urine culture=E.coli with >100,000 CFU , resistant to amp and bactrim.  Past few days, increased urinary frequency and dysuria.  She is also complaining of right suprapubic and flank pain.  No fevers. No n/v/d.   ROS: See HPI  Patient Active Problem List  Diagnoses  . HYPERLIPIDEMIA  . HYPERTENSION  . PAROXYSMAL ATRIAL FIBRILLATION  . UNSPECIFIED PERIPHERAL VASCULAR DISEASE  . ALLERGIC RHINITIS  . HIATAL HERNIA  . CONSTIPATION  . CELLULITIS, FOOT, LEFT  . FIBROMYALGIA  . WOUND, LEG  . CEREBROVASCULAR ACCIDENT, HX OF  . UTI'S, HX OF  . WRIST PAIN, RIGHT  . FACIAL PAIN  . BRADYCARDIA  . Gingivitis  . Weakness generalized  . UTI (lower urinary tract infection)  . Pyelonephritis  . Routine general medical examination at a health care facility   Past Medical History  Diagnosis Date  . Hyperlipidemia   . Hypertension   . Fibromyalgia   . Thyroid disease     hyperparathyroid  . PAD (peripheral artery disease)   . CVA (cerebral infarction)     2006  . TIA (transient ischemic attack)   . Atrial fibrillation     Not on Coumadin. Noted during 02/2009 admission.   . Skin cancer     Unknown types  . Schatzki's ring     Non obstructing ring on EGD 02/2009   . Pancreatic mass 02/2009    Abnormal CT abdomen, with cystic area in head and circumferential thickening of midbody of stomach   Past Surgical History  Procedure Date  . Appendectomy 1940  . Vaginal hysterectomy 1970  . Parathyroidectomy 1980   History  Substance Use Topics  . Smoking status: Never Smoker   . Smokeless tobacco: Never Used  . Alcohol Use: No   Family History  Problem Relation Age of Onset  . Cancer Other     breast ca  . Cancer      mouth ca    No Known Allergies Current Outpatient Prescriptions on File Prior to Visit  Medication Sig Dispense Refill  . amLODipine (NORVASC) 10 MG tablet Take  0.5 tablets (5 mg total) by mouth daily.  30 tablet  3  . Ascorbic Acid (VITAMIN C) 500 MG tablet Take 500 mg by mouth daily.        Marland Kitchen aspirin 325 MG tablet Take 325 mg by mouth daily.        . calcium carbonate (OS-CAL) 600 MG TABS Take 600 mg by mouth daily.        Marland Kitchen losartan (COZAAR) 100 MG tablet take 1 tablet by mouth once daily  30 tablet  6  . metoprolol tartrate (LOPRESSOR) 25 MG tablet take 1 tablet by mouth at bedtime  30 tablet  6  . Multiple Vitamins-Minerals (CENTRUM SILVER PO) Take 1 tablet by mouth daily.        . pantoprazole (PROTONIX) 40 MG tablet Take 1 tablet (40 mg total) by mouth daily.  30 tablet  6  . senna (SENOKOT) 8.6 MG tablet Take one tablet two times daily, hold for loose bowels        The PMH, PSH, Social History, Family History, Medications, and allergies have been reviewed in [facility], and have been updated if relevant.  Physical exam: BP 140/62  Pulse 72  Temp(Src) 97.8 F (36.6 C) (Oral)  Wt 118 lb (53.524 kg)  General: elderly female in NAD  Mouth: MMM  Abd:  Mild suprapubic tenderness, no CVA tenderness, no rebound or guarding. Psych: Oriented X3, memory intact for recent and remote, normally interactive, good eye contact, not anxious appearing, not depressed appearing, and not agitated.    Assessment and Plan:  1. Pyelonephritis   New. UA pos for LE. Given history, will treat with Keflex 500 mg twice daily 7 days. Send urine for cx. Call niece with results. The patient indicates understanding of these issues and agrees with the plan.

## 2011-07-27 LAB — URINE CULTURE: Organism ID, Bacteria: NO GROWTH

## 2011-08-27 ENCOUNTER — Other Ambulatory Visit: Payer: Self-pay | Admitting: *Deleted

## 2011-08-27 MED ORDER — LOSARTAN POTASSIUM 100 MG PO TABS: 100.0000 mg | ORAL_TABLET | Freq: Every day | ORAL | Status: DC

## 2011-10-16 ENCOUNTER — Telehealth: Payer: Self-pay | Admitting: Family Medicine

## 2011-10-16 NOTE — Telephone Encounter (Signed)
appt already scheduled with Dr Dayton Martes on 10/18/11 at 12 noon.

## 2011-10-16 NOTE — Telephone Encounter (Signed)
Caller: Janet/Other; PCP: Ruthe Mannan ([facility]); CB#: [phone];HQIO regarding On Med MWF for Reflux (Protonix); Pt thinks Rx causes gas/ bloating and wants to stop it. Reviewed drug information in [facility];  gas is side effect.  Caller asks if there is alternate medication that can be given for reflux.   Afebrile/subjective.  Emergent sx ruled out. See  provider in 72 hours per Abdominal Distention protocol.Caller insists on aptt w/ PCP. Scheduled for 1200 on 10/18/11.

## 2011-10-18 ENCOUNTER — Ambulatory Visit (INDEPENDENT_AMBULATORY_CARE_PROVIDER_SITE_OTHER): Payer: Medicare Other | Admitting: Family Medicine

## 2011-10-18 ENCOUNTER — Encounter: Payer: Self-pay | Admitting: Family Medicine

## 2011-10-18 VITALS — BP 164/70 | HR 76 | Temp 97.9°F | Wt 119.0 lb

## 2011-10-18 DIAGNOSIS — R141 Gas pain: Secondary | ICD-10-CM | POA: Insufficient documentation

## 2011-10-18 MED ORDER — RANITIDINE HCL 150 MG PO TABS: 150.0000 mg | ORAL_TABLET | Freq: Two times a day (BID) | ORAL | Status: DC

## 2011-10-18 NOTE — Patient Instructions (Addendum)
Increase fiber and water, and try over the counter gas-x (four times a day when necessary) or beano for bloating.  Exclude gas producing foods (beans, onions, celery, carrots, raisins, bananas, apricots, prunes, brussel sprouts, wheat germ, pretzels)   We are stopping your protonix and starting Zantac. Please call me immediately if your symptoms worsen.

## 2011-10-18 NOTE — Progress Notes (Signed)
Subjective:    Patient ID: Pamela [name], female    DOB: [birth year], 78 y.o.   MRN: [id number]  HPI  78 yo female with h/o hiatal hernia and GERD on long term protonix here for gassiness and bloating for past several weeks.  Feels like she can't burp.  Thought it was due to protonix so stopped taking and felt a little better until yesterday- had body aches and nausea, low energy.  All of those symptoms have now resolved.  No diarrhea. No blood in stool. No fever. No vomiting.  Admits to eating gasy foods with every meal- onions, squash, cabbage, etc.  Patient Active Problem List  Diagnosis  . HYPERLIPIDEMIA  . HYPERTENSION  . PAROXYSMAL ATRIAL FIBRILLATION  . UNSPECIFIED PERIPHERAL VASCULAR DISEASE  . ALLERGIC RHINITIS  . HIATAL HERNIA  . CONSTIPATION  . CELLULITIS, FOOT, LEFT  . FIBROMYALGIA  . WOUND, LEG  . CEREBROVASCULAR ACCIDENT, HX OF  . UTI'S, HX OF  . WRIST PAIN, RIGHT  . FACIAL PAIN  . BRADYCARDIA  . Gingivitis  . Weakness generalized  . UTI (lower urinary tract infection)  . Pyelonephritis  . Routine general medical examination at a health care facility  . Abdominal gas pain   Past Medical History  Diagnosis Date  . Hyperlipidemia   . Hypertension   . Fibromyalgia   . Thyroid disease     hyperparathyroid  . PAD (peripheral artery disease)   . CVA (cerebral infarction)     2006  . TIA (transient ischemic attack)   . Atrial fibrillation     Not on Coumadin. Noted during 02/2009 admission.   . Skin cancer     Unknown types  . Schatzki's ring     Non obstructing ring on EGD 02/2009   . Pancreatic mass 02/2009    Abnormal CT abdomen, with cystic area in head and circumferential thickening of midbody of stomach   Past Surgical History  Procedure Date  . Appendectomy 1940  . Vaginal hysterectomy 1970  . Parathyroidectomy 1980   History  Substance Use Topics  . Smoking status: Never Smoker   . Smokeless tobacco: Never Used  . Alcohol Use: No    Family History  Problem Relation Age of Onset  . Cancer Other     breast ca  . Cancer      mouth ca    No Known Allergies Current Outpatient Prescriptions on File Prior to Visit  Medication Sig Dispense Refill  . amLODipine (NORVASC) 10 MG tablet Take 0.5 tablets (5 mg total) by mouth daily.  30 tablet  3  . Ascorbic Acid (VITAMIN C) 500 MG tablet Take 500 mg by mouth daily.        Marland Kitchen aspirin 325 MG tablet Take 325 mg by mouth daily.        . calcium carbonate (OS-CAL) 600 MG TABS Take 600 mg by mouth daily.        Marland Kitchen losartan (COZAAR) 100 MG tablet Take 1 tablet (100 mg total) by mouth daily.  30 tablet  11  . metoprolol tartrate (LOPRESSOR) 25 MG tablet take 1 tablet by mouth at bedtime  30 tablet  6  . Multiple Vitamins-Minerals (CENTRUM SILVER PO) Take 1 tablet by mouth daily.        Marland Kitchen senna (SENOKOT) 8.6 MG tablet Take one tablet two times daily, hold for loose bowels       . ranitidine (ZANTAC) 150 MG tablet Take 1 tablet (150 mg total) by  mouth 2 (two) times daily.  60 tablet  6   The PMH, PSH, Social History, Family History, Medications, and allergies have been reviewed in [facility], and have been updated if relevant.   Review of Systems See HPI No constipation    Objective:   Physical Exam BP 164/70  Pulse 76  Temp 97.9 F (36.6 C)  Wt 119 lb (53.978 kg) Gen:  Alert, elderly pleasant female, NAD HEENT:  MMM Abd:  Soft, NT, pos BS, no rebound or guarding.   Ext:  No edema Psych:  Good eye contact, mildly anxious (baseline) but pleasant. Assessment & Plan:   1. Abdominal gas pain    New- advised supportive care.  See pt instructions for details. No red flag symptoms currently. Protonix d/c'd. Will start Zantac.

## 2011-12-11 ENCOUNTER — Inpatient Hospital Stay (HOSPITAL_COMMUNITY)
Admission: EM | Admit: 2011-12-11 | Discharge: 2011-12-14 | DRG: 563 | Disposition: A | Discharge status: Skilled Nursing Facility | Payer: Medicare Other | Attending: Internal Medicine | Admitting: Internal Medicine

## 2011-12-11 ENCOUNTER — Encounter (HOSPITAL_COMMUNITY): Payer: Self-pay

## 2011-12-11 ENCOUNTER — Emergency Department (HOSPITAL_COMMUNITY): Payer: Medicare Other

## 2011-12-11 DIAGNOSIS — Z85828 Personal history of other malignant neoplasm of skin: Secondary | ICD-10-CM

## 2011-12-11 DIAGNOSIS — W108XXA Fall (on) (from) other stairs and steps, initial encounter: Secondary | ICD-10-CM | POA: Diagnosis present

## 2011-12-11 DIAGNOSIS — Z0000 Encounter for general adult medical examination without abnormal findings: Secondary | ICD-10-CM

## 2011-12-11 DIAGNOSIS — I739 Peripheral vascular disease, unspecified: Secondary | ICD-10-CM | POA: Diagnosis present

## 2011-12-11 DIAGNOSIS — Z23 Encounter for immunization: Secondary | ICD-10-CM

## 2011-12-11 DIAGNOSIS — Z79899 Other long term (current) drug therapy: Secondary | ICD-10-CM

## 2011-12-11 DIAGNOSIS — L03119 Cellulitis of unspecified part of limb: Secondary | ICD-10-CM

## 2011-12-11 DIAGNOSIS — K0510 Chronic gingivitis, plaque induced: Secondary | ICD-10-CM

## 2011-12-11 DIAGNOSIS — I498 Other specified cardiac arrhythmias: Secondary | ICD-10-CM

## 2011-12-11 DIAGNOSIS — Z8673 Personal history of transient ischemic attack (TIA), and cerebral infarction without residual deficits: Secondary | ICD-10-CM

## 2011-12-11 DIAGNOSIS — S82839A Other fracture of upper and lower end of unspecified fibula, initial encounter for closed fracture: Principal | ICD-10-CM | POA: Diagnosis present

## 2011-12-11 DIAGNOSIS — I252 Old myocardial infarction: Secondary | ICD-10-CM

## 2011-12-11 DIAGNOSIS — I10 Essential (primary) hypertension: Secondary | ICD-10-CM | POA: Diagnosis present

## 2011-12-11 DIAGNOSIS — Z87448 Personal history of other diseases of urinary system: Secondary | ICD-10-CM

## 2011-12-11 DIAGNOSIS — E876 Hypokalemia: Secondary | ICD-10-CM | POA: Diagnosis present

## 2011-12-11 DIAGNOSIS — R141 Gas pain: Secondary | ICD-10-CM

## 2011-12-11 DIAGNOSIS — S82409A Unspecified fracture of shaft of unspecified fibula, initial encounter for closed fracture: Secondary | ICD-10-CM | POA: Diagnosis present

## 2011-12-11 DIAGNOSIS — Z7982 Long term (current) use of aspirin: Secondary | ICD-10-CM

## 2011-12-11 DIAGNOSIS — M25539 Pain in unspecified wrist: Secondary | ICD-10-CM

## 2011-12-11 DIAGNOSIS — R531 Weakness: Secondary | ICD-10-CM

## 2011-12-11 DIAGNOSIS — S81009A Unspecified open wound, unspecified knee, initial encounter: Secondary | ICD-10-CM

## 2011-12-11 DIAGNOSIS — J309 Allergic rhinitis, unspecified: Secondary | ICD-10-CM

## 2011-12-11 DIAGNOSIS — Y92009 Unspecified place in unspecified non-institutional (private) residence as the place of occurrence of the external cause: Secondary | ICD-10-CM

## 2011-12-11 DIAGNOSIS — N12 Tubulo-interstitial nephritis, not specified as acute or chronic: Secondary | ICD-10-CM

## 2011-12-11 DIAGNOSIS — I4891 Unspecified atrial fibrillation: Secondary | ICD-10-CM

## 2011-12-11 DIAGNOSIS — R51 Headache: Secondary | ICD-10-CM

## 2011-12-11 DIAGNOSIS — IMO0001 Reserved for inherently not codable concepts without codable children: Secondary | ICD-10-CM | POA: Diagnosis present

## 2011-12-11 DIAGNOSIS — K449 Diaphragmatic hernia without obstruction or gangrene: Secondary | ICD-10-CM

## 2011-12-11 DIAGNOSIS — N390 Urinary tract infection, site not specified: Secondary | ICD-10-CM

## 2011-12-11 DIAGNOSIS — E785 Hyperlipidemia, unspecified: Secondary | ICD-10-CM | POA: Diagnosis present

## 2011-12-11 DIAGNOSIS — Z8679 Personal history of other diseases of the circulatory system: Secondary | ICD-10-CM

## 2011-12-11 DIAGNOSIS — K5900 Constipation, unspecified: Secondary | ICD-10-CM

## 2011-12-11 HISTORY — DX: Unspecified fracture of shaft of unspecified fibula, initial encounter for closed fracture: S82.409A

## 2011-12-11 HISTORY — DX: Gastro-esophageal reflux disease without esophagitis: K21.9

## 2011-12-11 HISTORY — DX: Unspecified hearing loss, unspecified ear: H91.90

## 2011-12-11 HISTORY — DX: Cerebral infarction, unspecified: I63.9

## 2011-12-11 HISTORY — DX: Personal history of other diseases of the digestive system: Z87.19

## 2011-12-11 MED ORDER — HYDROCODONE-ACETAMINOPHEN 5-325 MG PO TABS
1.0000 | ORAL_TABLET | Freq: Once | ORAL | Status: AC
  Administered 2011-12-11: 1 via ORAL
  Filled 2011-12-11: qty 1

## 2011-12-11 NOTE — ED Notes (Signed)
Per daughter pt fell down 1 step, pt unable to bare weight on (L) leg, pt c/o (L)leg pain. Bruising noted to (L) calf. + sensation

## 2011-12-11 NOTE — ED Provider Notes (Signed)
History     CSN: [id number]  Arrival date & time 12/11/11  1958   First MD Initiated Contact with Patient 12/11/11 2039      Chief Complaint  Patient presents with  . Fall    (Consider location/radiation/quality/duration/timing/severity/associated sxs/prior treatment) HPI History provided by pt.   Pt twisted her left leg while walking down the steps this evening.  Did not fall but sat herself down on the ground.  When she attempted to bear weight, she developed severe pain in knee as well as ankle.  No associated paresthesias.  Has not taken anything for pain.  Pt is not anti-coagulated.  No prior h/o fx to LLE.  Lives by herself and is able to ambulate independently.    Past Medical History  Diagnosis Date  . Hyperlipidemia   . Hypertension   . Fibromyalgia   . Thyroid disease     hyperparathyroid  . PAD (peripheral artery disease)   . CVA (cerebral infarction)     2006  . TIA (transient ischemic attack)   . Atrial fibrillation     Not on Coumadin. Noted during 02/2009 admission.   . Skin cancer     Unknown types  . Schatzki's ring     Non obstructing ring on EGD 02/2009   . Pancreatic mass 02/2009    Abnormal CT abdomen, with cystic area in head and circumferential thickening of midbody of stomach    Past Surgical History  Procedure Date  . Appendectomy 1940  . Vaginal hysterectomy 1970  . Parathyroidectomy 1980    Family History  Problem Relation Age of Onset  . Cancer Other     breast ca  . Cancer      mouth ca     History  Substance Use Topics  . Smoking status: Never Smoker   . Smokeless tobacco: Never Used  . Alcohol Use: No    OB History    Grav Para Term Preterm Abortions TAB SAB Ect Mult Living                  Review of Systems  All other systems reviewed and are negative.    Allergies  Review of patient's allergies indicates no known allergies.  Home Medications   Current Outpatient Rx  Name Route Sig Dispense Refill  .  AMLODIPINE BESYLATE 10 MG PO TABS Oral Take 5 mg by mouth daily.    Marland Kitchen VITAMIN C 500 MG PO TABS Oral Take 500 mg by mouth daily.      . ASPIRIN 325 MG PO TABS Oral Take 325 mg by mouth daily.      Marland Kitchen CALCIUM CARBONATE 600 MG PO TABS Oral Take 600 mg by mouth daily.      Marland Kitchen LOSARTAN POTASSIUM 100 MG PO TABS Oral Take 100 mg by mouth daily.    Marland Kitchen METOPROLOL TARTRATE 25 MG PO TABS Oral Take 25 mg by mouth at bedtime.     . CENTRUM SILVER PO Oral Take 1 tablet by mouth daily.      Marland Kitchen RANITIDINE HCL 150 MG PO TABS Oral Take 150 mg by mouth 2 (two) times daily.    . SENNOSIDES 8.6 MG PO TABS  Take one tablet two times daily, hold for loose bowels       BP 160/67  Pulse 68  Temp 97.9 F (36.6 C) (Oral)  Resp 18  SpO2 94%  Physical Exam  Nursing note and vitals reviewed. Constitutional: She is oriented to person,  place, and time. She appears well-developed and well-nourished. No distress.  HENT:  Head: Normocephalic and atraumatic.  Eyes:       Normal appearance  Neck: Normal range of motion.  Cardiovascular: Normal rate and regular rhythm.   Pulmonary/Chest: Effort normal and breath sounds normal.  Musculoskeletal: Normal range of motion.       LLE w/out deformity, edema or ecchymosis.  Tenderness at proximal lateral lower leg as well as dorsomedial proximal foot.  No pain w/ ROM of ankle.  Severe pain w/ minimal flexion of knee.  Nml hip.  2+ DP pulse and distal sensation intact.    Neurological: She is alert and oriented to person, place, and time.  Psychiatric: She has a normal mood and affect. Her behavior is normal.    ED Course  Procedures (including critical care time)  Labs Reviewed - No data to display Dg Tibia/fibula Left  12/11/2011  *RADIOLOGY REPORT*  Clinical Data: Fall down stairs.  Left lower leg pain.  LEFT TIBIA AND FIBULA - 2 VIEW  Comparison: None.  Findings: Subtle transverse proximal fibular metaphyseal fracture noted.  A corresponding tibial fracture is not observed.   Linear calcification along the dorsum of the head of the talus may reflect avulsion along the dorsal talonavicular ligament.  IMPRESSION:  1.  Irregular proximal fibular metaphysis, favoring nondisplaced acute fracture over remote fracture.  However, no corresponding tibial fracture is identified. 2.  Potentially avulsion along the talar attachment of the dorsal talonavicular ligament.   Original Report Authenticated By: Dellia Cloud, M.D.      1. Fibula fracture       MDM  78yo F twisted her left leg today and presents to ED w/ severe pain and inability to bear weight.  Xray shows proximal fibula fx.  No signs of hip fx on exam.  Pt has received vicodin with relief.  Ortho tech placed in long slint. [facility] to admit, ultimately for placement into rehab facility. She lives alone, there are stairs in her home and no local family for her to stay with.  Labs sig for mild hypokalemia which has been replaced.          Otilio Miu, [place] 12/12/11 [phone]

## 2011-12-11 NOTE — Progress Notes (Signed)
Orthopedic Tech Progress Note Patient Details:  Pamela [name] 1916-12-13 [id number]  Ortho Devices Type of Ortho Device: Ace wrap;Long leg splint Ortho Device/Splint Location: (L) LE Ortho Device/Splint Interventions: Application   Jennye Moccasin 12/11/2011, 11:24 PM

## 2011-12-11 NOTE — ED Notes (Addendum)
Per EMS pt from home, pt reports falling down here steps landing on her (L) leg, pt has a bruise to (L) shin, denies neck or back pain. Pt unable to apply pressure or flex her (L) foot. Ice applied en route. VSS bp 140/90, hr 90, rr 18, CBG 86. No rotation or shortening noted

## 2011-12-12 DIAGNOSIS — I10 Essential (primary) hypertension: Secondary | ICD-10-CM

## 2011-12-12 DIAGNOSIS — S82409A Unspecified fracture of shaft of unspecified fibula, initial encounter for closed fracture: Secondary | ICD-10-CM

## 2011-12-12 DIAGNOSIS — E876 Hypokalemia: Secondary | ICD-10-CM

## 2011-12-12 LAB — CBC WITH DIFFERENTIAL/PLATELET
Basophils Absolute: 0 10*3/uL (ref 0.0–0.1)
Basophils Relative: 0 % (ref 0–1)
Eosinophils Absolute: 0 10*3/uL (ref 0.0–0.7)
Eosinophils Relative: 0 % (ref 0–5)
HCT: 38 % (ref 36.0–46.0)
Hemoglobin: 12.6 g/dL (ref 12.0–15.0)
MCH: 30.4 pg (ref 26.0–34.0)
MCHC: 33.2 g/dL (ref 30.0–36.0)
Monocytes Absolute: 1.1 10*3/uL — ABNORMAL HIGH (ref 0.1–1.0)
Monocytes Relative: 10 % (ref 3–12)
Neutro Abs: 8.3 10*3/uL — ABNORMAL HIGH (ref 1.7–7.7)
RDW: 13.4 % (ref 11.5–15.5)

## 2011-12-12 LAB — URINALYSIS, ROUTINE W REFLEX MICROSCOPIC
Bilirubin Urine: NEGATIVE
Hgb urine dipstick: NEGATIVE
Ketones, ur: 15 mg/dL — AB
Protein, ur: NEGATIVE mg/dL
Urobilinogen, UA: 0.2 mg/dL (ref 0.0–1.0)

## 2011-12-12 LAB — BASIC METABOLIC PANEL
BUN: 23 mg/dL (ref 6–23)
Calcium: 9 mg/dL (ref 8.4–10.5)
Chloride: 105 mEq/L (ref 96–112)
Creatinine, Ser: 0.65 mg/dL (ref 0.50–1.10)
GFR calc Af Amer: 86 mL/min — ABNORMAL LOW (ref >90)
GFR calc non Af Amer: 74 mL/min — ABNORMAL LOW (ref >90)

## 2011-12-12 MED ORDER — SODIUM CHLORIDE 0.9 % IJ SOLN
3.0000 mL | Freq: Two times a day (BID) | INTRAMUSCULAR | Status: DC
  Administered 2011-12-12 – 2011-12-13 (×4): 3 mL via INTRAVENOUS

## 2011-12-12 MED ORDER — SENNA 8.6 MG PO TABS
1.0000 | ORAL_TABLET | Freq: Two times a day (BID) | ORAL | Status: DC
  Administered 2011-12-12 – 2011-12-14 (×5): 8.6 mg via ORAL
  Filled 2011-12-12 (×6): qty 1

## 2011-12-12 MED ORDER — ASPIRIN 325 MG PO TABS
325.0000 mg | ORAL_TABLET | Freq: Every day | ORAL | Status: DC
  Administered 2011-12-12 – 2011-12-14 (×3): 325 mg via ORAL
  Filled 2011-12-12 (×3): qty 1

## 2011-12-12 MED ORDER — PNEUMOCOCCAL VAC POLYVALENT 25 MCG/0.5ML IJ INJ
0.5000 mL | INJECTION | INTRAMUSCULAR | Status: AC
  Filled 2011-12-12: qty 0.5

## 2011-12-12 MED ORDER — HYDROMORPHONE HCL PF 1 MG/ML IJ SOLN: 0.5000 mg | Freq: Once | INTRAMUSCULAR | Status: DC

## 2011-12-12 MED ORDER — FAMOTIDINE IN NACL 20-0.9 MG/50ML-% IV SOLN
20.0000 mg | Freq: Once | INTRAVENOUS | Status: AC
  Administered 2011-12-12: 20 mg via INTRAVENOUS
  Filled 2011-12-12: qty 50

## 2011-12-12 MED ORDER — LOSARTAN POTASSIUM 50 MG PO TABS
100.0000 mg | ORAL_TABLET | Freq: Every day | ORAL | Status: DC
  Administered 2011-12-12 – 2011-12-14 (×3): 100 mg via ORAL
  Filled 2011-12-12 (×3): qty 2

## 2011-12-12 MED ORDER — METOPROLOL TARTRATE 25 MG PO TABS
25.0000 mg | ORAL_TABLET | Freq: Every day | ORAL | Status: DC
  Administered 2011-12-12 – 2011-12-13 (×2): 25 mg via ORAL
  Filled 2011-12-12 (×3): qty 1

## 2011-12-12 MED ORDER — POTASSIUM CHLORIDE 20 MEQ/15ML (10%) PO LIQD
40.0000 meq | Freq: Once | ORAL | Status: AC
  Administered 2011-12-12: 40 meq via ORAL
  Filled 2011-12-12: qty 30

## 2011-12-12 MED ORDER — FAMOTIDINE 20 MG PO TABS
20.0000 mg | ORAL_TABLET | Freq: Two times a day (BID) | ORAL | Status: DC
  Administered 2011-12-12 – 2011-12-14 (×5): 20 mg via ORAL
  Filled 2011-12-12 (×6): qty 1

## 2011-12-12 MED ORDER — CALCIUM CARBONATE 1250 (500 CA) MG PO TABS
1.0000 | ORAL_TABLET | Freq: Every day | ORAL | Status: DC
  Administered 2011-12-12 – 2011-12-14 (×3): 500 mg via ORAL
  Filled 2011-12-12 (×3): qty 1

## 2011-12-12 MED ORDER — SODIUM CHLORIDE 0.9 % IV SOLN: 250.0000 mL | INTRAVENOUS | Status: DC | PRN

## 2011-12-12 MED ORDER — HEPARIN SODIUM (PORCINE) 5000 UNIT/ML IJ SOLN
5000.0000 [IU] | Freq: Three times a day (TID) | INTRAMUSCULAR | Status: DC
  Administered 2011-12-12 – 2011-12-14 (×6): 5000 [IU] via SUBCUTANEOUS
  Filled 2011-12-12 (×9): qty 1

## 2011-12-12 MED ORDER — SODIUM CHLORIDE 0.9 % IJ SOLN: 3.0000 mL | INTRAMUSCULAR | Status: DC | PRN

## 2011-12-12 MED ORDER — POTASSIUM CHLORIDE CRYS ER 20 MEQ PO TBCR: 40.0000 meq | EXTENDED_RELEASE_TABLET | Freq: Once | ORAL | Status: DC

## 2011-12-12 MED ORDER — SENNOSIDES 8.6 MG PO TABS: 1.0000 | ORAL_TABLET | Freq: Two times a day (BID) | ORAL | Status: DC

## 2011-12-12 MED ORDER — AMLODIPINE BESYLATE 5 MG PO TABS
5.0000 mg | ORAL_TABLET | Freq: Every day | ORAL | Status: DC
  Administered 2011-12-13 – 2011-12-14 (×2): 5 mg via ORAL
  Filled 2011-12-12 (×4): qty 1

## 2011-12-12 MED ORDER — CALCIUM CARBONATE 600 MG PO TABS
600.0000 mg | ORAL_TABLET | Freq: Every day | ORAL | Status: DC
  Filled 2011-12-12: qty 1

## 2011-12-12 MED ORDER — INFLUENZA VIRUS VACC SPLIT PF IM SUSP
0.5000 mL | INTRAMUSCULAR | Status: AC
  Administered 2011-12-13: 0.5 mL via INTRAMUSCULAR
  Filled 2011-12-12: qty 0.5

## 2011-12-12 MED ORDER — DIAZEPAM 5 MG PO TABS
2.5000 mg | ORAL_TABLET | Freq: Once | ORAL | Status: AC
  Administered 2011-12-12: 2.5 mg via ORAL
  Filled 2011-12-12: qty 1

## 2011-12-12 MED ORDER — POTASSIUM CHLORIDE 20 MEQ/15ML (10%) PO LIQD
20.0000 meq | Freq: Once | ORAL | Status: AC
  Administered 2011-12-12: 20 meq via ORAL
  Filled 2011-12-12: qty 15

## 2011-12-12 MED ORDER — HYDROCODONE-ACETAMINOPHEN 5-325 MG PO TABS
1.0000 | ORAL_TABLET | ORAL | Status: DC | PRN
Start: 2011-12-12 — End: 2011-12-14
  Administered 2011-12-12 – 2011-12-14 (×9): 1 via ORAL
  Filled 2011-12-12 (×9): qty 1

## 2011-12-12 MED ORDER — VITAMIN C 500 MG PO TABS
500.0000 mg | ORAL_TABLET | Freq: Every day | ORAL | Status: DC
  Administered 2011-12-12 – 2011-12-14 (×3): 500 mg via ORAL
  Filled 2011-12-12 (×3): qty 1

## 2011-12-12 NOTE — Progress Notes (Addendum)
I have reviewed blood work and vitals sign. Stable at this time. PT evaluation ordered and status changed to inpatient. Pt was admitted for Left fibula fracture, was splinted. Please see H&P note from earlier today by Dr. Julian Reil. Will obtain CBC and BMP in AM. Please note that potassium was supplemented in ED. Will call ortho consult.  Debbora Presto, MD  [facility] Pager [phone]  If 7PM-7AM, please contact night-coverage [email or web address] Password [email or web address]

## 2011-12-12 NOTE — ED Notes (Signed)
Dr Earvin Hansen in to assess pt. Pt appears in a lot of pain and is hyperventilating. New orders received.

## 2011-12-12 NOTE — ED Notes (Signed)
Pt moved onto Rt side for comfort and pillows placed between legs. Pt states this has greatly reduced her pain

## 2011-12-12 NOTE — ED Notes (Signed)
Per V.O. From Dr Earvin Hansen hold Dilaudid for now and give pt more potassium first.

## 2011-12-12 NOTE — Progress Notes (Signed)
Utilization review complete 

## 2011-12-12 NOTE — H&P (Signed)
[facility] History and Physical  Pamela [name] ZOX:[id number] DOB: 1917/01/10 DOA: 12/11/2011  Referring physician: ED PCP: Ruthe Mannan, MD   Chief Complaint: L fibula fracture  HPI: Pamela [name] is a 78 y.o. female who was doing well at home when she unfortunately missed a step going down stairs.  This resulted in a fall which has left her with a fractured fibula on her L leg.  Unfortunately the patient is in fairly severe pain, unable to bear weight on the leg and lives alone.  Hospitalist service has been asked to admit the patient for pain control and placement.  The pain onset earlier this evening in the context of falling down the stairs and breaking her leg.  She states the pain is constant in duration, Vicodin and valium have not really provided much pain relief to the patient.  Moving around exacerbates the pain.  Review of Systems: 12 systems are reviewed and negative except as per HPI.  Past Medical History  Diagnosis Date  . Hyperlipidemia   . Hypertension   . Fibromyalgia   . Thyroid disease     hyperparathyroid  . PAD (peripheral artery disease)   . CVA (cerebral infarction)     2006  . TIA (transient ischemic attack)   . Atrial fibrillation     Not on Coumadin. Noted during 02/2009 admission.   . Skin cancer     Unknown types  . Schatzki's ring     Non obstructing ring on EGD 02/2009   . Pancreatic mass 02/2009    Abnormal CT abdomen, with cystic area in head and circumferential thickening of midbody of stomach   Past Surgical History  Procedure Date  . Appendectomy 1940  . Vaginal hysterectomy 1970  . Parathyroidectomy 1980   Social History:  reports that she has never smoked. She has never used smokeless tobacco. She reports that she does not drink alcohol or use illicit drugs. Patient lives at home, was doing most ADLs but unfortunately she is home alone and cannot bear weight on her leg currently so other arrangements will need to be made.  No  Known Allergies  Family History  Problem Relation Age of Onset  . Cancer Other     breast ca  . Cancer      mouth ca     Prior to Admission medications   Medication Sig Start Date End Date Taking? Authorizing Provider  amLODipine (NORVASC) 10 MG tablet Take 5 mg by mouth daily.   Yes Historical Provider, MD  Ascorbic Acid (VITAMIN C) 500 MG tablet Take 500 mg by mouth daily.     Yes Historical Provider, MD  aspirin 325 MG tablet Take 325 mg by mouth daily.     Yes Historical Provider, MD  calcium carbonate (OS-CAL) 600 MG TABS Take 600 mg by mouth daily.     Yes Historical Provider, MD  losartan (COZAAR) 100 MG tablet Take 100 mg by mouth daily.   Yes Historical Provider, MD  metoprolol tartrate (LOPRESSOR) 25 MG tablet Take 25 mg by mouth at bedtime.  06/21/11  Yes Dianne Dun, MD  Multiple Vitamins-Minerals (CENTRUM SILVER PO) Take 1 tablet by mouth daily.     Yes Historical Provider, MD  ranitidine (ZANTAC) 150 MG tablet Take 150 mg by mouth 2 (two) times daily. 10/18/11 10/17/12 Yes Dianne Dun, MD  senna (SENOKOT) 8.6 MG tablet Take one tablet two times daily, hold for loose bowels    Yes Historical Provider,  MD   Physical Exam: Filed Vitals:   12/11/11 2126 12/12/11 0231 12/12/11 0344 12/12/11 0400  BP: 160/67 152/68 151/67 153/71  Pulse: 68 84  83  Temp: 97.9 F (36.6 C)     TempSrc: Oral     Resp: 18 20  21   SpO2: 94% 97% 99% 93%     General:  Moderate to severe distress, patient clearly in pain  Eyes: PEERLA EOMI  ENT: moist mucous membranes  Neck: supple w/o JVD  Cardiovascular: slightly tachycardic w/o MRG  Respiratory: CTA B  Abdomen: soft, nt, nd, bs+  Skin: C/D/I, cast in place LLE  Musculoskeletal: cast in place LLE, neurovascularly intact Labs on Admission:  Basic Metabolic Panel:  Lab 12/12/11 4098  NA 142  K 3.2*  CL 105  CO2 27  GLUCOSE 111*  BUN 23  CREATININE 0.65  CALCIUM 9.0  MG --  PHOS --   Liver Function Tests: No results  found for this basename: AST:5,ALT:5,ALKPHOS:5,BILITOT:5,PROT:5,ALBUMIN:5 in the last 168 hours No results found for this basename: LIPASE:5,AMYLASE:5 in the last 168 hours No results found for this basename: AMMONIA:5 in the last 168 hours CBC:  Lab 12/12/11 0021  WBC 10.5  NEUTROABS 8.3*  HGB 12.6  HCT 38.0  MCV 91.6  PLT 174   Cardiac Enzymes: No results found for this basename: CKTOTAL:5,CKMB:5,CKMBINDEX:5,TROPONINI:5 in the last 168 hours  BNP (last 3 results) No results found for this basename: PROBNP:3 in the last 8760 hours CBG: No results found for this basename: GLUCAP:5 in the last 168 hours  Radiological Exams on Admission: Dg Tibia/fibula Left  12/11/2011  *RADIOLOGY REPORT*  Clinical Data: Fall down stairs.  Left lower leg pain.  LEFT TIBIA AND FIBULA - 2 VIEW  Comparison: None.  Findings: Subtle transverse proximal fibular metaphyseal fracture noted.  A corresponding tibial fracture is not observed.  Linear calcification along the dorsum of the head of the talus may reflect avulsion along the dorsal talonavicular ligament.  IMPRESSION:  1.  Irregular proximal fibular metaphysis, favoring nondisplaced acute fracture over remote fracture.  However, no corresponding tibial fracture is identified. 2.  Potentially avulsion along the talar attachment of the dorsal talonavicular ligament.   Original Report Authenticated By: Dellia Cloud, M.D.     Assessment/Plan Principal Problem:  *Fibula fracture Active Problems:  HYPERLIPIDEMIA  HYPERTENSION  Hypokalemia   1. Fibula fracture - patients leg has already been splinted, focus at this point is pain control, will order for very low dose dilaudid to be tried to get pain under control in this patient.  Patient likely will need placement as she is unable to bear weight on her leg, either in a rehab or another assisted living facility.  Good blood flow to toes.  Pain best controlled by repositioning  apparently. 2. Hypokalemia - potentially causing muscle spasms worseing the patients leg pain, have asked for a repeat potassium after treating with an additional 20 meq K+ po (patient already got 20 meq K+ once in ED). 3. Hyperlipidemia - continue home meds 4. Hypertension - continue home meds.  Code Status: Discussed with patient and daughter, patient has living will but wishes to be made a Full code despite age, will put her in the computer as a full code. Family Communication: Daughter at bedside Disposition Plan: Admit to obs  Time spent: 45 min  Delainey Winstanley M. [facility] Pager [phone]  If 7PM-7AM, please contact night-coverage [email or web address] Password [facility] 12/12/2011, 6:13 AM

## 2011-12-12 NOTE — ED Notes (Signed)
Pt's niece Dorcas Mcmurray contact info: Cell # [phone].

## 2011-12-12 NOTE — ED Notes (Signed)
Report given to laura 5N

## 2011-12-13 ENCOUNTER — Encounter (HOSPITAL_COMMUNITY): Payer: Self-pay | Admitting: General Practice

## 2011-12-13 DIAGNOSIS — J309 Allergic rhinitis, unspecified: Secondary | ICD-10-CM

## 2011-12-13 DIAGNOSIS — I4891 Unspecified atrial fibrillation: Secondary | ICD-10-CM

## 2011-12-13 DIAGNOSIS — R143 Flatulence: Secondary | ICD-10-CM

## 2011-12-13 LAB — CBC
MCH: 30.6 pg (ref 26.0–34.0)
Platelets: 174 10*3/uL (ref 150–400)
RBC: 4.48 MIL/uL (ref 3.87–5.11)
WBC: 8.5 10*3/uL (ref 4.0–10.5)

## 2011-12-13 LAB — BASIC METABOLIC PANEL
BUN: 17 mg/dL (ref 6–23)
CO2: 26 mEq/L (ref 19–32)
Calcium: 9.1 mg/dL (ref 8.4–10.5)
Creatinine, Ser: 0.67 mg/dL (ref 0.50–1.10)
Glucose, Bld: 109 mg/dL — ABNORMAL HIGH (ref 70–99)

## 2011-12-13 NOTE — Progress Notes (Signed)
Orthopedic Tech Progress Note Patient Details:  NIDRA RULON [birth year] [id number]  Patient ID: Baird Cancer, female   DOB: September 19, 1916, 78 y.o.   MRN: [id number] Correction: cam walker was applied and fitted  Nikki Dom 12/13/2011, 2:54 PM

## 2011-12-13 NOTE — Progress Notes (Signed)
Physical Therapy Treatment Patient Details Name: Pamela [name] MRN: [id number] DOB: Jun 02, 1916 Today's Date: 12/13/2011 Time: 9629-5284 PT Time Calculation (min): 27 min  PT Assessment / Plan / Recommendation Comments on Treatment Session  Pt having difficulty weight bearing on L LE secondary to pain and fear of falling.      Follow Up Recommendations  Skilled nursing facility;Supervision/Assistance - 24 hour    Barriers to Discharge        Equipment Recommendations  None recommended by PT    Recommendations for Other Services    Frequency Min 3X/week   Plan Discharge plan remains appropriate;Frequency remains appropriate    Precautions / Restrictions Precautions Precautions: Fall Required Braces or Orthoses: Other Brace/Splint Other Brace/Splint: Fracture boot Restrictions Weight Bearing Restrictions: Yes LLE Weight Bearing: Weight bearing as tolerated Other Position/Activity Restrictions: WBAT in Fracture boot.    Pertinent Vitals/Pain Pt c/o pain in L LE.  Unable to rate.  RN notified.     Mobility  Bed Mobility Bed Mobility: Supine to Sit;Sitting - Scoot to Edge of Bed Supine to Sit: 2: Max assist;With rails;HOB flat Sitting - Scoot to [place] of Bed: 2: Max assist Details for Bed Mobility Assistance: Assist to manage L LE and assist to scoot hips to edge of bed Transfers Transfers: Sit to Stand;Stand to Sit;Stand Pivot Transfers Sit to Stand: 1: +2 Total assist;From bed;With upper extremity assist Sit to Stand: Patient Percentage: 40% Stand to Sit: 2: Max assist;To chair/3-in-1;With upper extremity assist Stand Pivot Transfers: 1: +2 Total assist Stand Pivot Transfers: Patient Percentage: 30% Details for Transfer Assistance: Step by step cueing for sequencing. Repeat verbal and tactile cueing to shift wt forward over L LE.   Ambulation/Gait Ambulation/Gait Assistance: Not tested (comment) Wheelchair Mobility Wheelchair Mobility: No    Exercises     PT  Diagnosis:    PT Problem List:   PT Treatment Interventions:     PT Goals Acute Rehab PT Goals PT Goal Formulation: With patient Time For Goal Achievement: 12/27/11 Potential to Achieve Goals: Fair Pt will go Supine/Side to Sit: with min assist PT Goal: Supine/Side to Sit - Progress: Progressing toward goal Pt will go Sit to Supine/Side: with min assist PT Goal: Sit to Supine/Side - Progress: Progressing toward goal Pt will Transfer Bed to Chair/Chair to Bed: with min assist PT Transfer Goal: Bed to Chair/Chair to Bed - Progress: Goal set today  Visit Information  Last PT Received On: 12/13/11 Assistance Needed: +2    Subjective Data  Subjective: Agree to oob activity Patient Stated Goal: I don't know how much I can do.    Cognition  Overall Cognitive Status: Appears within functional limits for tasks assessed/performed Arousal/Alertness: Awake/alert Orientation Level: Oriented X4 / Intact Behavior During Session: WFL for tasks performed    Balance     End of Session PT - End of Session Equipment Utilized During Treatment: Other (comment) (Cam boot) Activity Tolerance: Patient limited by pain Patient left: in chair;with call bell/phone within reach;Other (comment);with chair alarm set Nurse Communication: Mobility status;Other (comment)   GP     Tira Lafferty 12/13/2011, 6:11 PM [place]wood Pierron L. Choice Kleinsasser DPT [phone]

## 2011-12-13 NOTE — Progress Notes (Signed)
Bed offer in place from [facility]- which is the bed of choice for patient.  Plan d/c to SNF tomorrow if stable per MD.  Notified patient's niece- [name] of above- she will be available in the afternoon to sign admit papers if she is discharged.  Will monitor and notify patient of above.  Messages left for [facility]- Revonda Standard- Admissions Director of above.  Lorri Frederick. [place]  [phone]

## 2011-12-13 NOTE — ED Provider Notes (Signed)
Medical screening examination/treatment/procedure(s) were performed by non-physician practitioner and as supervising physician I was immediately available for consultation/collaboration.   David H Yao, MD 12/13/11 0838 

## 2011-12-13 NOTE — Progress Notes (Signed)
PHYSICAL THERAPY EVALUATION   12/13/11 1400  PT Visit Information  Last PT Received On 12/13/11  Assistance Needed +2  PT Time Calculation  PT Start Time 1400  PT Stop Time 1419  PT Time Calculation (min) 19 min  Subjective Data  Subjective Agree to PT eval  Patient Stated Goal I want to go to [facility].   Precautions  Precautions Fall  Required Braces or Orthoses Other Brace/Splint  Other Brace/Splint Fracture boot  Restrictions  Weight Bearing Restrictions Yes  LLE Weight Bearing WBAT  Other Position/Activity Restrictions WBAT in Fracture boot.   Home Living  Lives With Alone  Available Help at Discharge Skilled Nursing Facility  Prior Function  Level of Independence Independent  Driving Yes  Vocation Retired  IT consultant  Overall Cognitive Status Appears within functional limits for tasks assessed/performed  Arousal/Alertness Awake/alert  Orientation Level Oriented X4 / Intact  Behavior During Session [facility] for tasks performed  Right Upper Extremity Assessment  RUE ROM/Strength/Tone WFL  Left Upper Extremity Assessment  LUE ROM/Strength/Tone WFL  Right Lower Extremity Assessment  RLE ROM/Strength/Tone WFL  Left Lower Extremity Assessment  LLE ROM/Strength/Tone Unable to fully assess;Due to pain  LLE Sensation WFL - Light Touch  Bed Mobility  Bed Mobility Supine to Sit;Sitting - Scoot to Edge of Bed  Supine to Sit 2: Max assist;With rails;HOB flat  Sitting - Scoot to [place] of Bed 2: Max assist  Details for Bed Mobility Assistance Assist to manage L LE and assist to scoot hips to edge of bed  Transfers  Transfers Not assessed  Ambulation/Gait  Ambulation/Gait Assistance Not tested (comment)  Wheelchair Mobility  Wheelchair Mobility No  PT - End of Session  Activity Tolerance Patient limited by pain  Patient left in bed;with call bell/phone within reach;with bed alarm set  Nurse Communication Mobility status;Other (comment) (Need for fracture boot. )  PT  Assessment  Clinical Impression Statement Pt is a 78 y/o female s/p L fibula fracture. Unable to assess pt's gait or transfer secondary to pt not having fracture boot in room. RN to contact MD for order for fracture boot.    PT Recommendation/Assessment Patient needs continued PT services  PT Problem List Decreased strength;Decreased activity tolerance;Decreased balance;Decreased mobility;Decreased knowledge of use of DME;Decreased knowledge of precautions;Pain  Barriers to Discharge Decreased caregiver support  PT Therapy Diagnosis  Acute pain;Generalized weakness  PT Plan  PT Frequency Min 3X/week  PT Treatment/Interventions DME instruction;Gait training;Functional mobility training;Therapeutic activities;Therapeutic exercise;Balance training;Patient/family education;Wheelchair mobility training  PT Recommendation  Follow Up Recommendations Skilled nursing facility;Supervision/Assistance - 24 hour  Equipment Recommended None recommended by PT  Individuals Consulted  Consulted and Agree with Results and Recommendations Patient  Acute Rehab PT Goals  PT Goal Formulation With patient  Time For Goal Achievement 12/27/11  Potential to Achieve Goals Fair  Pt will go Supine/Side to Sit with min assist  PT Goal: Supine/Side to Sit - Progress Goal set today  Pt will go Sit to Supine/Side with min assist  PT Goal: Sit to Supine/Side - Progress Goal set today  Pt will Transfer Bed to Chair/Chair to Bed with min assist  PT Transfer Goal: Bed to Chair/Chair to Bed - Progress Goal set today  PT G-Codes **NOT FOR INPATIENT CLASS**  Functional Limitation Mobility: Walking and moving around  Mobility: Walking and Moving Around Current Status (R6045) CL  Mobility: Walking and Moving Around Goal Status (W0981) CI  PT General Charges  $$ ACUTE PT VISIT 1 Procedure  PT Evaluation  $Initial PT Evaluation Tier I 1 Procedure  PT Treatments  $Therapeutic Activity 8-22 mins   Jozi Malachi L. Sammy Cassar DPT  [phone]

## 2011-12-13 NOTE — Progress Notes (Signed)
Proximal fibular fracture wbat in fx boot  Full consult to follow in 3 hours i will orded pt consult

## 2011-12-13 NOTE — Clinical Social Work Placement (Addendum)
    Clinical Social Work Department CLINICAL SOCIAL WORK PLACEMENT NOTE 12/13/2011  Patient:  Pamela [name], Pamela [name]  Account Number:  [id number] Admit date:  12/11/2011  Clinical Social Worker:  Lupita Leash Ceri Mayer,  LCSWA Date/time:  12/12/2011 06:20 PM  Clinical Social Work is seeking post-discharge placement for this patient at the following level of care:   SKILLED NURSING   (*CSW will update this form in Epic as items are completed)   12/12/2011  Patient/family provided with [facility] Department of Clinical Social Work's list of facilities offering this level of care within the geographic area requested by the patient (or if unable, by the patient's family).  12/12/2011  Patient/family informed of their freedom to choose among providers that offer the needed level of care, that participate in Medicare, Medicaid or managed care program needed by the patient, have an available bed and are willing to accept the patient.  12/12/2011  Patient/family informed of [facility]' ownership interest in [facility], as well as of the fact that they are under no obligation to receive care at this facility.  PASARR submitted to EDS on 12/13/2011 PASARR number received from EDS on 12/13/2011  FL2 transmitted to all facilities in geographic area requested by pt/family on  12/13/2011 FL2 transmitted to all facilities within larger geographic area on   Patient informed that his/her managed care company has contracts with or will negotiate with  certain facilities, including the following:   [facility], [place]     Patient/family informed of bed offers received:  12/13/2011 Patient chooses bed at [facility] Physician recommends and patient chooses bed at  [facility]  Patient to be transferred to [facility] on 12/14/11   Patient to be transferred to facility by Ambulance Sharin Mons)  The following physician request were entered in Epic:   Additional Comments: Patient is very  pleased with d/c plan. Daughter is aware. Notified SNF and patient's nurse- Heather of d/c plan.  Lorri Frederick. [place] Pugh  [phone]

## 2011-12-13 NOTE — Progress Notes (Signed)
Orthopedic Tech Progress Note Patient Details:  Pamela [name] 02/23/1917 [id number]  Ortho Devices Type of Ortho Device: CAM walker Ortho Device/Splint Location: left foot Ortho Device/Splint Interventions: Freeman Caldron, Disa Riedlinger 12/13/2011, 2:52 PM

## 2011-12-13 NOTE — Progress Notes (Signed)
PHYSICAL THERAPY NOTE Unable to complete PT  Evaluation as pt does not have fracture boot in her room.  No MD order for fracture boot or Weight bearing status found in chart.  Dr. Diamantina Providence last note states that pt is to be WBAT in fracture boot and Lower leg is to be immobilized in bed.  Acute PT will complete PT eval when fracture boot is delivered.   Jacquez Sheetz L. Shahan Starks DPT [phone].   12/13/2011

## 2011-12-13 NOTE — Consult Note (Signed)
Reason for Consult:left leg pain Referring Physician: Dr Harlow Asa Pamela [name] is an 78 y.o. female.  HPI: Non syncopal fall yesterday with LLE pain and difficulty wb. Denies other ortho problems  Past Medical History  Diagnosis Date  . Hyperlipidemia   . Hypertension   . Fibromyalgia   . Thyroid disease     hyperparathyroid  . PAD (peripheral artery disease)   . TIA (transient ischemic attack) ~ 2009    "only 1 that I know of" (12/13/2011)  . Atrial fibrillation     Not on Coumadin. Noted during 02/2009 admission.   . Skin cancer     Unknown types  . Schatzki's ring     Non obstructing ring on EGD 02/2009   . Pancreatic mass 02/2009    Abnormal CT abdomen, with cystic area in head and circumferential thickening of midbody of stomach  . Fibula fracture 12/11/2011    left  . Stroke 2006    "drags right leg very slightly" (12/13/2011)  . GERD (gastroesophageal reflux disease)   . H/O hiatal hernia   . Hard of hearing     "totally deaf in the right; 50% is gone in the left" (12/13/2011)    Past Surgical History  Procedure Date  . Appendectomy 1940  . Parathyroidectomy 1980  . Vaginal hysterectomy 1970    Family History  Problem Relation Age of Onset  . Cancer Other     breast ca  . Cancer      mouth ca     Social History:  reports that she has never smoked. She has never used smokeless tobacco. She reports that she does not drink alcohol or use illicit drugs.  Allergies: No Known Allergies  Medications: I have reviewed the patient's current medications.  Results for orders placed during the hospital encounter of 12/11/11 (from the past 48 hour(s))  CBC WITH DIFFERENTIAL     Status: Abnormal   Collection Time   12/12/11 12:21 AM      Component Value Range Comment   WBC 10.5  4.0 - 10.5 K/uL    RBC 4.15  3.87 - 5.11 MIL/uL    Hemoglobin 12.6  12.0 - 15.0 g/dL    HCT 91.4  78.2 - 95.6 %    MCV 91.6  78.0 - 100.0 fL    MCH 30.4  26.0 - 34.0 pg    MCHC 33.2  30.0  - 36.0 g/dL    RDW 21.3  08.6 - 57.8 %    Platelets 174  150 - 400 K/uL    Neutrophils Relative 79 (*) 43 - 77 %    Neutro Abs 8.3 (*) 1.7 - 7.7 K/uL    Lymphocytes Relative 10 (*) 12 - 46 %    Lymphs Abs 1.1  0.7 - 4.0 K/uL    Monocytes Relative 10  3 - 12 %    Monocytes Absolute 1.1 (*) 0.1 - 1.0 K/uL    Eosinophils Relative 0  0 - 5 %    Eosinophils Absolute 0.0  0.0 - 0.7 K/uL    Basophils Relative 0  0 - 1 %    Basophils Absolute 0.0  0.0 - 0.1 K/uL   BASIC METABOLIC PANEL     Status: Abnormal   Collection Time   12/12/11 12:21 AM      Component Value Range Comment   Sodium 142  135 - 145 mEq/L    Potassium 3.2 (*) 3.5 - 5.1 mEq/L  Chloride 105  96 - 112 mEq/L    CO2 27  19 - 32 mEq/L    Glucose, Bld 111 (*) 70 - 99 mg/dL    BUN 23  6 - 23 mg/dL    Creatinine, Ser 1.61  0.50 - 1.10 mg/dL    Calcium 9.0  8.4 - 09.6 mg/dL    GFR calc non Af Amer 74 (*) >90 mL/min    GFR calc Af Amer 86 (*) >90 mL/min   URINALYSIS, ROUTINE W REFLEX MICROSCOPIC     Status: Abnormal   Collection Time   12/12/11  4:40 AM      Component Value Range Comment   Color, Urine YELLOW  YELLOW    APPearance CLEAR  CLEAR    Specific Gravity, Urine 1.015  1.005 - 1.030    pH 8.5 (*) 5.0 - 8.0    Glucose, UA 100 (*) NEGATIVE mg/dL    Hgb urine dipstick NEGATIVE  NEGATIVE    Bilirubin Urine NEGATIVE  NEGATIVE    Ketones, ur 15 (*) NEGATIVE mg/dL    Protein, ur NEGATIVE  NEGATIVE mg/dL    Urobilinogen, UA 0.2  0.0 - 1.0 mg/dL    Nitrite NEGATIVE  NEGATIVE    Leukocytes, UA NEGATIVE  NEGATIVE MICROSCOPIC NOT DONE ON URINES WITH NEGATIVE PROTEIN, BLOOD, LEUKOCYTES, NITRITE, OR GLUCOSE <1000 mg/dL.  POTASSIUM     Status: Normal   Collection Time   12/12/11  5:34 AM      Component Value Range Comment   Potassium 3.7  3.5 - 5.1 mEq/L   BASIC METABOLIC PANEL     Status: Abnormal   Collection Time   12/13/11  5:00 AM      Component Value Range Comment   Sodium 135  135 - 145 mEq/L DELTA CHECK NOTED    Potassium 4.0  3.5 - 5.1 mEq/L    Chloride 100  96 - 112 mEq/L    CO2 26  19 - 32 mEq/L    Glucose, Bld 109 (*) 70 - 99 mg/dL    BUN 17  6 - 23 mg/dL    Creatinine, Ser 0.45  0.50 - 1.10 mg/dL    Calcium 9.1  8.4 - 40.9 mg/dL    GFR calc non Af Amer 73 (*) >90 mL/min    GFR calc Af Amer 85 (*) >90 mL/min   CBC     Status: Normal   Collection Time   12/13/11  5:00 AM      Component Value Range Comment   WBC 8.5  4.0 - 10.5 K/uL    RBC 4.48  3.87 - 5.11 MIL/uL    Hemoglobin 13.7  12.0 - 15.0 g/dL    HCT 81.1  91.4 - 78.2 %    MCV 92.0  78.0 - 100.0 fL    MCH 30.6  26.0 - 34.0 pg    MCHC 33.3  30.0 - 36.0 g/dL    RDW 95.6  21.3 - 08.6 %    Platelets 174  150 - 400 K/uL     Dg Tibia/fibula Left  12/11/2011  *RADIOLOGY REPORT*  Clinical Data: Fall down stairs.  Left lower leg pain.  LEFT TIBIA AND FIBULA - 2 VIEW  Comparison: None.  Findings: Subtle transverse proximal fibular metaphyseal fracture noted.  A corresponding tibial fracture is not observed.  Linear calcification along the dorsum of the head of the talus may reflect avulsion along the dorsal talonavicular ligament.  IMPRESSION:  1.  Irregular  proximal fibular metaphysis, favoring nondisplaced acute fracture over remote fracture.  However, no corresponding tibial fracture is identified. 2.  Potentially avulsion along the talar attachment of the dorsal talonavicular ligament.   Original Report Authenticated By: Dellia Cloud, M.D.     Review of Systems  Constitutional: Negative.   HENT: Positive for hearing loss.   Eyes: Negative.   Respiratory: Negative.   Cardiovascular: Negative.   Gastrointestinal: Negative.   Genitourinary: Negative.   Musculoskeletal: Positive for joint pain.  Skin: Negative.   Neurological: Negative.   Psychiatric/Behavioral: Negative.    Blood pressure 160/75, pulse 67, temperature 98.5 F (36.9 C), temperature source Oral, resp. rate 18, SpO2 99.00%. Physical Exam  Constitutional: She  appears well-developed.  HENT:  Head: Normocephalic.  Eyes: Pupils are equal, round, and reactive to light.  Neck: Normal range of motion.  Cardiovascular: Normal rate.   Respiratory: Effort normal.  Neurological: She is alert.  Skin: Skin is warm.  LLE with prox lat swelling - compt soft - df ok but slightly weak - foot perfused - no medial mal tenderness syndesmosis stable  Assessment/Plan: Non displaced prox fib fx near peroneal nerve syndesmosis ok - plan wbat in fx boot with PT assist - no need for immobilization while in bed - f/u 2 weeks at [facility] ortho [phone]  Asencion Loveday SCOTT 12/13/2011, 1:12 PM

## 2011-12-13 NOTE — Progress Notes (Signed)
Patient ID: Pamela [name], female   DOB: [birth year], 78 y.o.   MRN: [id number]  [facility] PROGRESS NOTE  NAVDEEP VOLO WUJ:[id number] DOB: 1916-12-05 DOA: 12/11/2011 PCP: Ruthe Mannan, MD  Brief narrative: Pt is 78 yo female admitted 12/11/2011 after sustaining fall at home and has suffered fibular fracture.  Principal Problem:  *Fibula fracture - PT evaluation pending - will continue to follow up on recommendations - will likely need placement - SW consult placed  Active Problems:  HYPERLIPIDEMIA - stable - will continue statin   HYPERTENSION - continue to monitor vitals per floor protocol - readjust the regimen as indicated   Hypokalemia - supplemented and within normal limits this AM - BMP in AM  Consultants:  Ortho  Procedures/Studies: Dg Tibia/fibula Left  12/11/2011  *RADIOLOGY REPORT*  Clinical Data: Fall down stairs.  Left lower leg pain.  LEFT TIBIA AND FIBULA - 2 VIEW  Comparison: None.  Findings: Subtle transverse proximal fibular metaphyseal fracture noted.  A corresponding tibial fracture is not observed.  Linear calcification along the dorsum of the head of the talus may reflect avulsion along the dorsal talonavicular ligament.  IMPRESSION:  1.  Irregular proximal fibular metaphysis, favoring nondisplaced acute fracture over remote fracture.  However, no corresponding tibial fracture is identified. 2.  Potentially avulsion along the talar attachment of the dorsal talonavicular ligament.   Original Report Authenticated By: Dellia Cloud, M.D.     Antibiotics:  None  Code Status: Full Family Communication: Pt at bedside Disposition Plan: PT evaluation   HPI/Subjective: No events overnight.   Objective: Filed Vitals:   12/12/11 1338 12/12/11 2200 12/13/11 0543 12/13/11 1440  BP: 153/61 150/66 160/75 124/50  Pulse: 70 78 67 64  Temp: 98.3 F (36.8 C) 98.5 F (36.9 C) 98.5 F (36.9 C) 98.2 F (36.8 C)  TempSrc:  Oral    Resp: 18  18 16    SpO2: 98% 99% 99% 94%    Intake/Output Summary (Last 24 hours) at 12/13/11 1500 Last data filed at 12/13/11 1300  Gross per 24 hour  Intake    720 ml  Output   1000 ml  Net   -280 ml    Exam:   General:  Pt is alert, follows commands appropriately, not in acute distress  Cardiovascular: Regular rate and rhythm, S1/S2, no murmurs, no rubs, no gallops  Respiratory: Clear to auscultation bilaterally, no wheezing, no crackles, no rhonchi  Abdomen: Soft, non tender, non distended, bowel sounds present, no guarding  Extremities: No edema, pulses DP and PT palpable bilaterally  Neuro: Grossly nonfocal  Data Reviewed: Basic Metabolic Panel:  Lab 12/13/11 9562 12/12/11 0534 12/12/11 0021  NA 135 -- 142  K 4.0 3.7 3.2*  CL 100 -- 105  CO2 26 -- 27  GLUCOSE 109* -- 111*  BUN 17 -- 23  CREATININE 0.67 -- 0.65  CALCIUM 9.1 -- 9.0  MG -- -- --  PHOS -- -- --   CBC:  Lab 12/13/11 0500 12/12/11 0021  WBC 8.5 10.5  NEUTROABS -- 8.3*  HGB 13.7 12.6  HCT 41.2 38.0  MCV 92.0 91.6  PLT 174 174   Scheduled Meds:   . amLODipine  5 mg Oral Daily  . aspirin  325 mg Oral Daily  . calcium carbonate  1 tablet Oral Daily  . famotidine  20 mg Oral BID  . heparin  5,000 Units Subcutaneous Q8H  . influenza  inactive virus vaccine  0.5 mL Intramuscular Tomorrow-1000  .  losartan  100 mg Oral Daily  . metoprolol tartrate  25 mg Oral QHS  . pneumococcal 23 valent vaccine  0.5 mL Intramuscular Tomorrow-1000  . senna  1 tablet Oral BID  . sodium chloride  3 mL Intravenous Q12H  . vitamin C  500 mg Oral Daily   Continuous Infusions:    Debbora Presto, MD  [facility] Pager [phone]  If 7PM-7AM, please contact night-coverage [email or web address] Password [email or web address] 12/13/2011, 3:00 PM   LOS: 2 days

## 2011-12-13 NOTE — Clinical Social Work Psychosocial (Addendum)
    Clinical Social Work Department BRIEF PSYCHOSOCIAL ASSESSMENT 12/13/2011  Patient:  Pamela [name], Pamela [name]     Account Number:  [id number]     Admit date:  12/11/2011  Clinical Social Worker:  Tiburcio Pea   Date/Time:  12/12/2011 06:00 PM  Referred by:  Physician  Date Referred:  12/11/2011 Referred for  SNF Placement   Other Referral:   Interview type:  Other - See comment Other interview type:   Patient and great neice    PSYCHOSOCIAL DATA Living Status:  ALONE Admitted from facility:   Level of care:   Primary support name:  Dorcas Mcmurray  [phone] Primary support relationship to patient:  FAMILY Degree of support available:   Very strong support from great neice and her husband    CURRENT CONCERNS Current Concerns  Post-Acute Placement   Other Concerns:    SOCIAL WORK ASSESSMENT / PLAN Met with patient and her great-neice Pamela [name] today to discuss need for short term SNF placement.  Patient states that she lives alone but she is very independent. She still drives and maintains her household.  Patient would like placement at [facility] and has been a resident there in the past.  Bed search process discussed and initated. Fl2 sent to [facility]. SNFs.   Assessment/plan status:  Psychosocial Support/Ongoing Assessment of Needs Other assessment/ plan:   Information/referral to community resources:   SNF list provided to patient.  Discussed after care needs for HH/DME to be arranged by SNF as indicated.    PATIENT'S/FAMILY'S RESPONSE TO PLAN OF CARE: Patient is alert, oriented and very pleasant. She has a positive attitude about SNF rehab and is looking forward to being able to go home after rehab. She feels that she will remain independent as long as possible.

## 2011-12-14 ENCOUNTER — Observation Stay (HOSPITAL_COMMUNITY): Payer: Medicare Other

## 2011-12-14 LAB — CBC
HCT: 39.9 % (ref 36.0–46.0)
Hemoglobin: 13.1 g/dL (ref 12.0–15.0)
MCH: 30.3 pg (ref 26.0–34.0)
MCHC: 32.8 g/dL (ref 30.0–36.0)
RDW: 13.7 % (ref 11.5–15.5)

## 2011-12-14 LAB — BASIC METABOLIC PANEL
BUN: 19 mg/dL (ref 6–23)
Chloride: 103 mEq/L (ref 96–112)
Creatinine, Ser: 0.75 mg/dL (ref 0.50–1.10)
GFR calc Af Amer: 82 mL/min — ABNORMAL LOW (ref >90)
GFR calc non Af Amer: 71 mL/min — ABNORMAL LOW (ref >90)
Glucose, Bld: 106 mg/dL — ABNORMAL HIGH (ref 70–99)
Potassium: 4.3 mEq/L (ref 3.5–5.1)

## 2011-12-14 MED ORDER — CYCLOBENZAPRINE HCL 5 MG PO TABS
5.0000 mg | ORAL_TABLET | Freq: Once | ORAL | Status: DC
  Filled 2011-12-14: qty 1

## 2011-12-14 MED ORDER — HYDROCODONE-ACETAMINOPHEN 5-325 MG PO TABS: 1.0000 | ORAL_TABLET | ORAL | Status: DC | PRN

## 2011-12-14 NOTE — Discharge Summary (Signed)
Physician Discharge Summary  Pamela [name]:[id number] DOB: 07/17/1916 DOA: 12/11/2011  PCP: Ruthe Mannan, MD  Admit date: 12/11/2011 Discharge date: 12/14/2011  Recommendations for Outpatient Follow-up:  1. Pt will need to follow up with PCP in 2-3 weeks post discharge 2. Please obtain BMP to evaluate electrolytes and kidney function, please check potassium since it was low on admission but was supplemented 3. Please also check CBC to evaluate Hg and Hct levels 4. PT to continue at SNF  Discharge Diagnoses: Fibular fracture Principal Problem:  *Fibula fracture Active Problems:  HYPERLIPIDEMIA  HYPERTENSION  Hypokalemia   Discharge Condition: Stable  Diet recommendation: Heart healthy diet discussed in details   History of present illness:  Pt is 78 yo female admitted 12/11/2011 after sustaining fall at home and has suffered fibular fracture.   Principal Problem:  *Fibula fracture  - PT evaluation done and SNF placement recommended - will prescribe analgesia for adequate symptom control - pt given fracture boot to assist with PT  Active Problems:  HYPERLIPIDEMIA  - stable  - will continue statin   HYPERTENSION  - BP remained stable during the hospitalization - continued home medication regimen  Hypokalemia  - supplemented and within normal limits this AM   Consultants:  Ortho  Procedures/Studies:  Dg Tibia/fibula Left 12/11/2011 *RADIOLOGY REPORT* Clinical Data: Fall down stairs. Left lower leg pain. LEFT TIBIA AND FIBULA - 2 VIEW Comparison: None. Findings: Subtle transverse proximal fibular metaphyseal fracture noted. A corresponding tibial fracture is not observed. Linear calcification along the dorsum of the head of the talus may reflect avulsion along the dorsal talonavicular ligament. IMPRESSION: 1. Irregular proximal fibular metaphysis, favoring nondisplaced acute fracture over remote fracture. However, no corresponding tibial fracture is identified. 2.  Potentially avulsion along the talar attachment of the dorsal talonavicular ligament. Original Report Authenticated By: Dellia Cloud, M.D.    Antibiotics:  None   Discharge Exam: Filed Vitals:   12/14/11 0528  BP: 147/62  Pulse: 70  Temp: 98 F (36.7 C)  Resp: 16   Filed Vitals:   12/13/11 0543 12/13/11 1440 12/13/11 2117 12/14/11 0528  BP: 160/75 124/50 142/50 147/62  Pulse: 67 64 81 70  Temp: 98.5 F (36.9 C) 98.2 F (36.8 C) 98.3 F (36.8 C) 98 F (36.7 C)  TempSrc:      Resp: 18 16 16 16   SpO2: 99% 94% 91% 94%    General: Pt is alert, follows commands appropriately, not in acute distress Cardiovascular: Regular rate and rhythm, S1/S2 +, no murmurs, no rubs, no gallops Respiratory: Clear to auscultation bilaterally, no wheezing, no crackles, no rhonchi Abdominal: Soft, non tender, non distended, bowel sounds +, no guarding Extremities: no edema, no cyanosis, pulses palpable bilaterally DP and PT Neuro: Grossly nonfocal  Discharge Instructions  Discharge Orders    Future Orders Please Complete By Expires   Diet - low sodium heart healthy      Increase activity slowly          Medication List     As of 12/14/2011  9:58 AM    TAKE these medications         amLODipine 10 MG tablet   Commonly known as: NORVASC   Take 5 mg by mouth daily.      aspirin 325 MG tablet   Take 325 mg by mouth daily.      calcium carbonate 600 MG Tabs   Commonly known as: OS-CAL   Take 600 mg by mouth daily.  CENTRUM SILVER PO   Take 1 tablet by mouth daily.      HYDROcodone-acetaminophen 5-325 MG per tablet   Commonly known as: NORCO/VICODIN   Take 1 tablet by mouth every 4 (four) hours as needed.      losartan 100 MG tablet   Commonly known as: COZAAR   Take 100 mg by mouth daily.      metoprolol tartrate 25 MG tablet   Commonly known as: LOPRESSOR   Take 25 mg by mouth at bedtime.      ranitidine 150 MG tablet   Commonly known as: ZANTAC   Take 150  mg by mouth 2 (two) times daily.      senna 8.6 MG tablet   Commonly known as: SENOKOT   Take one tablet two times daily, hold for loose bowels      vitamin C 500 MG tablet   Commonly known as: ASCORBIC ACID   Take 500 mg by mouth daily.           Follow-up Information    Follow up with Ruthe Mannan, MD. In 4 weeks.   Contact information:   [address] [place] [address] Royetta Crochet [place] [address] [phone]           The results of significant diagnostics from this hospitalization (including imaging, microbiology, ancillary and laboratory) are listed below for reference.     Labs: Basic Metabolic Panel:  Lab 12/14/11 9147 12/13/11 0500 12/12/11 0534 12/12/11 0021  NA 140 135 -- 142  K 4.3 4.0 3.7 3.2*  CL 103 100 -- 105  CO2 29 26 -- 27  GLUCOSE 106* 109* -- 111*  BUN 19 17 -- 23  CREATININE 0.75 0.67 -- 0.65  CALCIUM 9.1 9.1 -- 9.0  MG -- -- -- --  PHOS -- -- -- --   CBC:  Lab 12/14/11 0540 12/13/11 0500 12/12/11 0021  WBC 7.0 8.5 10.5  NEUTROABS -- -- 8.3*  HGB 13.1 13.7 12.6  HCT 39.9 41.2 38.0  MCV 92.1 92.0 91.6  PLT 167 174 174    SIGNED: Time coordinating discharge: Over 30 minutes  Debbora Presto, MD  [facility] 12/14/2011, 9:58 AM Pager [phone]  If 7PM-7AM, please contact night-coverage [email or web address] Password [email or web address]

## 2012-01-14 ENCOUNTER — Encounter: Payer: Self-pay | Admitting: Family Medicine

## 2012-01-14 ENCOUNTER — Ambulatory Visit (INDEPENDENT_AMBULATORY_CARE_PROVIDER_SITE_OTHER): Payer: Medicare Other | Admitting: Family Medicine

## 2012-01-14 VITALS — BP 138/70 | HR 64 | Temp 97.8°F | Wt 124.0 lb

## 2012-01-14 DIAGNOSIS — I10 Essential (primary) hypertension: Secondary | ICD-10-CM

## 2012-01-14 DIAGNOSIS — S82409A Unspecified fracture of shaft of unspecified fibula, initial encounter for closed fracture: Secondary | ICD-10-CM

## 2012-01-14 DIAGNOSIS — E876 Hypokalemia: Secondary | ICD-10-CM

## 2012-01-14 DIAGNOSIS — E785 Hyperlipidemia, unspecified: Secondary | ICD-10-CM

## 2012-01-14 LAB — COMPREHENSIVE METABOLIC PANEL
AST: 24 U/L (ref 0–37)
Alkaline Phosphatase: 111 U/L (ref 39–117)
BUN: 27 mg/dL — ABNORMAL HIGH (ref 6–23)
Glucose, Bld: 87 mg/dL (ref 70–99)
Potassium: 4.7 mEq/L (ref 3.5–5.1)
Total Bilirubin: 0.3 mg/dL (ref 0.3–1.2)

## 2012-01-14 LAB — CBC WITH DIFFERENTIAL/PLATELET
Basophils Relative: 0.6 % (ref 0.0–3.0)
Eosinophils Absolute: 0.1 10*3/uL (ref 0.0–0.7)
Eosinophils Relative: 2.2 % (ref 0.0–5.0)
HCT: 40.5 % (ref 36.0–46.0)
Hemoglobin: 13.3 g/dL (ref 12.0–15.0)
Lymphs Abs: 1.6 10*3/uL (ref 0.7–4.0)
MCHC: 32.9 g/dL (ref 30.0–36.0)
MCV: 93.2 fl (ref 78.0–100.0)
Monocytes Absolute: 0.6 10*3/uL (ref 0.1–1.0)
Neutro Abs: 3.9 10*3/uL (ref 1.4–7.7)
RBC: 4.35 Mil/uL (ref 3.87–5.11)
WBC: 6.3 10*3/uL (ref 4.5–10.5)

## 2012-01-14 NOTE — Progress Notes (Signed)
Subjective:    Patient ID: Pamela [name], female    DOB: May 07, 1916, 78 y.o.   MRN: [id number]  HPI  Very pleasant 78 yo female here with her niece for hospital follow up.  Notes reviewed.   Admitted to [facility] 12/11/2011- 12/14/2011 after fall which led to left non displaced fibula fracture with probable avulsion along the talonavicular ligament. She was walking down the steps of her house to her car port on 9/17 when her right knee buckled and she fell on left leg.   Dg Tibia/fibula Left 12/11/2011 *RADIOLOGY REPORT* Clinical Data: Fall down stairs. Left lower leg pain. LEFT TIBIA AND FIBULA - 2 VIEW Comparison: None. Findings: Subtle transverse proximal fibular metaphyseal fracture noted. A corresponding tibial fracture is not observed. Linear calcification along the dorsum of the head of the talus may reflect avulsion along the dorsal talonavicular ligament. IMPRESSION: 1. Irregular proximal fibular metaphysis, favoring nondisplaced acute fracture over remote fracture. However, no corresponding tibial fracture is identified. 2. Potentially avulsion along the talar attachment of the dorsal talonavicular ligament. Original Report Authenticated By: Dellia Cloud, M.D.   Placed in boot and d/c'd to [facility] for rehab.  She did have some hypokalemia which was repleted. Lab Results  Component Value Date   NA 140 12/14/2011   K 4.3 12/14/2011   CL 103 12/14/2011   CO2 29 12/14/2011   Now has a left lower leg wound that had progressed since the fall- it has improving.  Not in any pain currently. She does receive a Vicodin or muscle relaxant prior to PT.  Patient Active Problem List  Diagnosis  . HYPERLIPIDEMIA  . HYPERTENSION  . PAROXYSMAL ATRIAL FIBRILLATION  . UNSPECIFIED PERIPHERAL VASCULAR DISEASE  . ALLERGIC RHINITIS  . HIATAL HERNIA  . CONSTIPATION  . CELLULITIS, FOOT, LEFT  . FIBROMYALGIA  . WOUND, LEG  . CEREBROVASCULAR ACCIDENT, HX OF  . UTI'S, HX OF  . WRIST PAIN,  RIGHT  . FACIAL PAIN  . BRADYCARDIA  . Gingivitis  . Weakness generalized  . UTI (lower urinary tract infection)  . Pyelonephritis  . Routine general medical examination at a health care facility  . Abdominal gas pain  . Hypokalemia  . Fibula fracture   Past Medical History  Diagnosis Date  . Hyperlipidemia   . Hypertension   . Fibromyalgia   . Thyroid disease     hyperparathyroid  . PAD (peripheral artery disease)   . TIA (transient ischemic attack) ~ 2009    "only 1 that I know of" (12/13/2011)  . Atrial fibrillation     Not on Coumadin. Noted during 02/2009 admission.   . Skin cancer     Unknown types  . Schatzki's ring     Non obstructing ring on EGD 02/2009   . Pancreatic mass 02/2009    Abnormal CT abdomen, with cystic area in head and circumferential thickening of midbody of stomach  . Fibula fracture 12/11/2011    left  . Stroke 2006    "drags right leg very slightly" (12/13/2011)  . GERD (gastroesophageal reflux disease)   . H/O hiatal hernia   . Hard of hearing     "totally deaf in the right; 50% is gone in the left" (12/13/2011)   Past Surgical History  Procedure Date  . Appendectomy 1940  . Parathyroidectomy 1980  . Vaginal hysterectomy 1970   History  Substance Use Topics  . Smoking status: Never Smoker   . Smokeless tobacco: Never Used  .  Alcohol Use: No   Family History  Problem Relation Age of Onset  . Cancer Other     breast ca  . Cancer      mouth ca    No Known Allergies Current Outpatient Prescriptions on File Prior to Visit  Medication Sig Dispense Refill  . amLODipine (NORVASC) 10 MG tablet Take 5 mg by mouth daily.      . Ascorbic Acid (VITAMIN C) 500 MG tablet Take 500 mg by mouth daily.        Marland Kitchen aspirin 325 MG tablet Take 325 mg by mouth daily.        . calcium carbonate (OS-CAL) 600 MG TABS Take 600 mg by mouth daily.        Marland Kitchen HYDROcodone-acetaminophen (NORCO/VICODIN) 5-325 MG per tablet Take 1 tablet by mouth every 4 (four) hours  as needed.  45 tablet  0  . losartan (COZAAR) 100 MG tablet Take 100 mg by mouth daily.      . metoprolol tartrate (LOPRESSOR) 25 MG tablet Take 25 mg by mouth at bedtime.       . Multiple Vitamins-Minerals (CENTRUM SILVER PO) Take 1 tablet by mouth daily.        . ranitidine (ZANTAC) 150 MG tablet Take 150 mg by mouth 2 (two) times daily.      Marland Kitchen senna (SENOKOT) 8.6 MG tablet Take one tablet two times daily, hold for loose bowels        The PMH, PSH, Social History, Family History, Medications, and allergies have been reviewed in [facility], and have been updated if relevant.   Review of Systems See HPI No fever No pain    Objective:   Physical Exam BP 138/70  Pulse 64  Temp 97.8 F (36.6 C)  Wt 124 lb (56.246 kg) Gen:  Alert, pleasant female who appears younger than stated age MSK:   +1 edema left foot, no tenderness over entire fibula Skin:  Small open wound (dressed)- on left lower leg, no drainage, good granulation tissue Gait: Walking well with walker     Assessment & Plan:   1. HYPERTENSION  Normotensive today. On norvasc.   2. Leg wound Healing well. Continue arginate, dressing changes daily.   3. Fibula fracture  Continue PT. Anticipate d/c from SNF in the next week or two. CBC with Differential  4. Hypokalemia  Recheck electrolytes today. Comprehensive metabolic panel

## 2012-01-14 NOTE — Patient Instructions (Addendum)
Good to see you. We will call you with your lab results.   

## 2012-01-17 ENCOUNTER — Ambulatory Visit: Payer: Medicare Other | Admitting: Family Medicine

## 2012-02-08 ENCOUNTER — Telehealth: Payer: Self-pay

## 2012-02-08 DIAGNOSIS — L97909 Non-pressure chronic ulcer of unspecified part of unspecified lower leg with unspecified severity: Secondary | ICD-10-CM

## 2012-02-08 NOTE — Telephone Encounter (Signed)
Advised nurse.  She will call patient's niece to see if she wants to take pt to [facility] office tomorrow, and if so, she will call back for appt.

## 2012-02-08 NOTE — Telephone Encounter (Signed)
Pamela [name] with [facility] home [facility] left v/m pt has arterial ulcers on top of left foot one appears infected ? Feels warm and is red and swollen. Left foot 2 x size of rt foot ?Cellulitis.the other area is not healing on rt ankle. Pt has had ulcers since the summer when pt was in nursing home. Pamela [name] wonders if could get referral to wound clinic. Pt BP 130/58 at home usual today BP was 150/90 P 88.Please advise.

## 2012-02-08 NOTE — Telephone Encounter (Signed)
Patient has scheduled appt for tomorrow's Saturday clinic.

## 2012-02-08 NOTE — Telephone Encounter (Signed)
Yes but it sounds like she needs to be evaluated.  Would she like to make an appointment for weekend clinic tomorrow? I will place wound clinic referral.

## 2012-02-09 ENCOUNTER — Encounter: Payer: Self-pay | Admitting: Family Medicine

## 2012-02-09 ENCOUNTER — Ambulatory Visit (INDEPENDENT_AMBULATORY_CARE_PROVIDER_SITE_OTHER): Payer: Medicare Other | Admitting: Family Medicine

## 2012-02-09 VITALS — BP 146/70 | HR 69 | Temp 97.4°F | Wt 124.0 lb

## 2012-02-09 DIAGNOSIS — L97509 Non-pressure chronic ulcer of other part of unspecified foot with unspecified severity: Secondary | ICD-10-CM

## 2012-02-09 DIAGNOSIS — N390 Urinary tract infection, site not specified: Secondary | ICD-10-CM

## 2012-02-09 DIAGNOSIS — R300 Dysuria: Secondary | ICD-10-CM

## 2012-02-09 LAB — POCT URINALYSIS DIPSTICK
Ketones, UA: NEGATIVE
Spec Grav, UA: 1.02

## 2012-02-09 MED ORDER — CEFDINIR 300 MG PO CAPS: ORAL_CAPSULE | ORAL | Status: DC

## 2012-02-09 NOTE — Assessment & Plan Note (Signed)
Start omnicef 300mg  bid x 10d. Send urine for c/s.

## 2012-02-09 NOTE — Progress Notes (Signed)
OFFICE NOTE  02/09/2012  CC:  Chief Complaint  Patient presents with  . Urinary Tract Infection  . Foot Injury    sores on both feet     HPI: Patient is a 78 y.o. Caucasian female who is here with her niece for urinary complaints and foot complaints.  Reports 2d of urinary frequency and incontinence, no dysuria or back pain or fever.  Mild diffuse lower abd pains at times but says this commonly occurs when she takes her stool softener/laxative.  No probs with constipation lately.  Hx of old left foot injury, wound on dorsum, ruptured in the same area for the last 6 wks or so.  It seems to be waxing and waning lately--opening and closing.  Reports she has had imaging relatively recently to r/o osteomyelitis.  Also now has one on right ankle lately.  Niece with her today says pt has documented hx of poor LE circulation.  Last oral antibiotics was about 2 months ago.  Currently lives at home and has [facility] aid + PT. Has appt at [facility] 02/19/12.  Pertinent PMH:  Past Medical History  Diagnosis Date  . Hyperlipidemia   . Hypertension   . Fibromyalgia   . Thyroid disease     hyperparathyroid  . PAD (peripheral artery disease)   . TIA (transient ischemic attack) ~ 2009    "only 1 that I know of" (12/13/2011)  . Atrial fibrillation     Not on Coumadin. Noted during 02/2009 admission.   . Skin cancer     Unknown types  . Schatzki's ring     Non obstructing ring on EGD 02/2009   . Pancreatic mass 02/2009    Abnormal CT abdomen, with cystic area in head and circumferential thickening of midbody of stomach  . Fibula fracture 12/11/2011    left  . Stroke 2006    "drags right leg very slightly" (12/13/2011)  . GERD (gastroesophageal reflux disease)   . H/O hiatal hernia   . Hard of hearing     "totally deaf in the right; 50% is gone in the left" (12/13/2011)    MEDS:  Outpatient Prescriptions Prior to Visit  Medication Sig Dispense Refill  . amLODipine (NORVASC) 10 MG tablet  Take 5 mg by mouth daily.      . Ascorbic Acid (VITAMIN C) 500 MG tablet Take 500 mg by mouth daily.        Marland Kitchen aspirin 325 MG tablet Take 325 mg by mouth daily.        . calcium carbonate (OS-CAL) 600 MG TABS Take 600 mg by mouth daily.        Marland Kitchen HYDROcodone-acetaminophen (NORCO/VICODIN) 5-325 MG per tablet Take 1 tablet by mouth every 4 (four) hours as needed.  45 tablet  0  . losartan (COZAAR) 100 MG tablet Take 100 mg by mouth daily.      . methocarbamol (ROBAXIN) 500 MG tablet Take 500 mg by mouth every 6 (six) hours.      . metoprolol tartrate (LOPRESSOR) 25 MG tablet Take 25 mg by mouth at bedtime.       . Multiple Vitamins-Minerals (CENTRUM SILVER PO) Take 1 tablet by mouth daily.        . ranitidine (ZANTAC) 150 MG tablet Take 150 mg by mouth 2 (two) times daily.      Marland Kitchen senna (SENOKOT) 8.6 MG tablet Take one tablet two times daily, hold for loose bowels  PE: Blood pressure 146/70, pulse 69, temperature 97.4 F (36.3 C), temperature source Oral, weight 124 lb (56.246 kg), SpO2 96.00%. Gen: alert, pleasant, well appearing. CV: RRR, no m/r/g.   LUNGS: CTA bilat, nonlabored resps, good aeration in all lung fields. ABD: soft, NT, ND, BS normal.  No hepatospenomegaly or mass.  No bruits. EXT: no edema, but she has a superficial ulceration with mild erythema on right ankle medial mallolus region, similar ulceration without erythema or drainage on left calcaneal surface, and a linear-type superficial skin ulceration with minimal surrounding erythema on medial dorsal surface of left foot.  None of these areas has much tenderness.  No areas of fluctuance. BACK: no CVA tenderness  LAB: CC UA today showed trace blood and trace protein--otherwise normal  IMPRESSION AND PLAN:  UTI (lower urinary tract infection) Start omnicef 300mg  bid x 10d. Send urine for c/s.  Foot ulceration Bilaterall, superficial, minimal sign of associated cellulitis.  No sign of deeper soft tissue infection or  osteomyelitis. Omnicef started to cover this as well as her UTI. She will f/u with her PMD, Dr. Dayton Martes, in 5-6d for recheck of today's problems and she'll keep appt at [facility] wound care center on 02/19/12.    An After Visit Summary was printed and given to the patient.  FOLLOW UP: 5-6d with Dr. Dayton Martes

## 2012-02-09 NOTE — Assessment & Plan Note (Signed)
Bilaterall, superficial, minimal sign of associated cellulitis.  No sign of deeper soft tissue infection or osteomyelitis. Omnicef started to cover this as well as her UTI. She will f/u with her PMD, Dr. Dayton Martes, in 5-6d for recheck of today's problems and she'll keep appt at [facility] wound care center on 02/19/12.

## 2012-02-10 LAB — URINE CULTURE: Colony Count: NO GROWTH

## 2012-02-11 DIAGNOSIS — S8290XD Unspecified fracture of unspecified lower leg, subsequent encounter for closed fracture with routine healing: Secondary | ICD-10-CM

## 2012-02-11 DIAGNOSIS — I10 Essential (primary) hypertension: Secondary | ICD-10-CM

## 2012-02-11 DIAGNOSIS — M810 Age-related osteoporosis without current pathological fracture: Secondary | ICD-10-CM

## 2012-02-11 DIAGNOSIS — I4891 Unspecified atrial fibrillation: Secondary | ICD-10-CM

## 2012-02-14 ENCOUNTER — Ambulatory Visit (INDEPENDENT_AMBULATORY_CARE_PROVIDER_SITE_OTHER): Payer: Medicare Other | Admitting: Family Medicine

## 2012-02-14 ENCOUNTER — Encounter: Payer: Self-pay | Admitting: Family Medicine

## 2012-02-14 VITALS — BP 150/70 | HR 72 | Temp 97.5°F | Wt 125.0 lb

## 2012-02-14 DIAGNOSIS — N390 Urinary tract infection, site not specified: Secondary | ICD-10-CM

## 2012-02-14 DIAGNOSIS — L97509 Non-pressure chronic ulcer of other part of unspecified foot with unspecified severity: Secondary | ICD-10-CM

## 2012-02-14 LAB — POCT URINALYSIS DIPSTICK
Bilirubin, UA: NEGATIVE
Glucose, UA: NEGATIVE
Nitrite, UA: NEGATIVE
Spec Grav, UA: 1.02
Urobilinogen, UA: NEGATIVE

## 2012-02-14 NOTE — Progress Notes (Signed)
Very pleasant 78 yo female who is here with her niece for follow up weekend clinic.  Saw Dr. Milinda Cave on Saturday for urinary frequency/incontinence without dysuria or back pain or fever and to evaluate her foot wound.   Hx of old left foot injury, wound on dorsum, ruptured in the same area for the last 6 wks or so.  It opens and closes.  Has been receiving home health.  [facility] nurse advised wound care consult- appt made at [facility] for 11/26.  Dr. Milinda Cave placed on her on 10 day course of omnicef 300mg  bid to cover foot wound and possible UTI.  Sent urine for cx which was neg.  Urinary symptoms have improved.  She did have some slight incontinence this morning but she took a muscle relaxant before bed time.   Pertinent PMH:  Past Medical History  Diagnosis Date  . Hyperlipidemia   . Hypertension   . Fibromyalgia   . Thyroid disease     hyperparathyroid  . PAD (peripheral artery disease)   . TIA (transient ischemic attack) ~ 2009    "only 1 that I know of" (12/13/2011)  . Atrial fibrillation     Not on Coumadin. Noted during 02/2009 admission.   . Skin cancer     Unknown types  . Schatzki's ring     Non obstructing ring on EGD 02/2009   . Pancreatic mass 02/2009    Abnormal CT abdomen, with cystic area in head and circumferential thickening of midbody of stomach  . Fibula fracture 12/11/2011    left  . Stroke 2006    "drags right leg very slightly" (12/13/2011)  . GERD (gastroesophageal reflux disease)   . H/O hiatal hernia   . Hard of hearing     "totally deaf in the right; 50% is gone in the left" (12/13/2011)    MEDS:  Outpatient Prescriptions Prior to Visit  Medication Sig Dispense Refill  . amLODipine (NORVASC) 10 MG tablet Take 5 mg by mouth daily.      . Ascorbic Acid (VITAMIN C) 500 MG tablet Take 500 mg by mouth daily.        Marland Kitchen aspirin 325 MG tablet Take 325 mg by mouth daily.        . calcium carbonate (OS-CAL) 600 MG TABS Take 600 mg by mouth daily.         . cefdinir (OMNICEF) 300 MG capsule 1 cap po bid x 10d  20 capsule  0  . HYDROcodone-acetaminophen (NORCO/VICODIN) 5-325 MG per tablet Take 1 tablet by mouth every 4 (four) hours as needed.  45 tablet  0  . losartan (COZAAR) 100 MG tablet Take 100 mg by mouth daily.      . methocarbamol (ROBAXIN) 500 MG tablet Take 500 mg by mouth every 6 (six) hours.      . metoprolol tartrate (LOPRESSOR) 25 MG tablet Take 25 mg by mouth at bedtime.       . Multiple Vitamins-Minerals (CENTRUM SILVER PO) Take 1 tablet by mouth daily.        . ranitidine (ZANTAC) 150 MG tablet Take 150 mg by mouth 2 (two) times daily.      Marland Kitchen senna (SENOKOT) 8.6 MG tablet Take one tablet two times daily, hold for loose bowels         PE: BP 150/70  Pulse 72  Temp 97.5 F (36.4 C)  Wt 125 lb (56.7 kg)  Gen: alert, pleasant, well appearing. CV: RRR, no m/r/g.  LUNGS: CTA bilat, nonlabored resps, good aeration in all lung fields. ABD: soft, NT, ND, BS normal.  No hepatospenomegaly or mass.  No bruits. EXT: no edema, but she has a superficial ulceration with mild erythema on right ankle medial mallolus region, similar ulceration without erythema or drainage on left calcaneal surface, and a linear-type superficial skin ulceration with minimal surrounding erythema on medial dorsal surface of left foot.  None of these areas has much tenderness.  No areas of fluctuance. BACK: no CVA tenderness  UA neg today except for trace LE.  Assessment and Plan:  1. Foot ulceration  Improving. Keep appt with wound care center.   2. UTI (lower urinary tract infection)  UA neg. Finish course of omnicef. POCT urinalysis dipstick

## 2012-02-19 ENCOUNTER — Encounter: Payer: Self-pay | Admitting: Cardiothoracic Surgery

## 2012-02-19 ENCOUNTER — Encounter: Payer: Self-pay | Admitting: Nurse Practitioner

## 2012-02-20 ENCOUNTER — Other Ambulatory Visit: Payer: Self-pay | Admitting: Family Medicine

## 2012-02-24 ENCOUNTER — Encounter: Payer: Self-pay | Admitting: Nurse Practitioner

## 2012-02-24 ENCOUNTER — Encounter: Payer: Self-pay | Admitting: Cardiothoracic Surgery

## 2012-02-28 ENCOUNTER — Other Ambulatory Visit: Payer: Self-pay | Admitting: Family Medicine

## 2012-03-11 ENCOUNTER — Telehealth: Payer: Self-pay | Admitting: Family Medicine

## 2012-03-11 ENCOUNTER — Other Ambulatory Visit: Payer: Self-pay | Admitting: Family Medicine

## 2012-03-11 NOTE — Telephone Encounter (Signed)
Is this a supplement?

## 2012-03-11 NOTE — Telephone Encounter (Signed)
Pt's niece, Pamela [name], called to ask if Dr Dayton Martes wants pt to continue on prostat.  If so, she will need refill.  Please advise.

## 2012-03-12 NOTE — Telephone Encounter (Signed)
Left message asking niece to call me back.

## 2012-03-12 NOTE — Telephone Encounter (Signed)
Advised patient's niece.  She is going to let patient finish what she has on hand and will then stop it.

## 2012-03-12 NOTE — Telephone Encounter (Signed)
If she now eating normally and feels like she is getting back into her old habits, ok to stop.  If she and her niece feels she still needs this, we can refill it.

## 2012-03-12 NOTE — Telephone Encounter (Signed)
Niece says this is liquid in a packet and used for wound healing that she was given at [facility].  I advised her that she should check with the wound clinic but she says they told her to call you.  Please advise.

## 2012-03-26 ENCOUNTER — Encounter: Payer: Self-pay | Admitting: Cardiothoracic Surgery

## 2012-03-26 ENCOUNTER — Encounter: Payer: Self-pay | Admitting: Nurse Practitioner

## 2012-05-05 ENCOUNTER — Encounter (HOSPITAL_COMMUNITY): Payer: Self-pay | Admitting: Nurse Practitioner

## 2012-05-05 ENCOUNTER — Emergency Department (HOSPITAL_COMMUNITY): Payer: Medicare Other

## 2012-05-05 ENCOUNTER — Emergency Department (HOSPITAL_COMMUNITY)
Admission: EM | Admit: 2012-05-05 | Discharge: 2012-05-06 | Disposition: A | Discharge status: Home / Self Care | Payer: Medicare Other | Attending: Emergency Medicine | Admitting: Emergency Medicine

## 2012-05-05 DIAGNOSIS — Z8679 Personal history of other diseases of the circulatory system: Secondary | ICD-10-CM | POA: Insufficient documentation

## 2012-05-05 DIAGNOSIS — Z79899 Other long term (current) drug therapy: Secondary | ICD-10-CM | POA: Insufficient documentation

## 2012-05-05 DIAGNOSIS — I4891 Unspecified atrial fibrillation: Secondary | ICD-10-CM | POA: Insufficient documentation

## 2012-05-05 DIAGNOSIS — Z8673 Personal history of transient ischemic attack (TIA), and cerebral infarction without residual deficits: Secondary | ICD-10-CM | POA: Insufficient documentation

## 2012-05-05 DIAGNOSIS — N390 Urinary tract infection, site not specified: Secondary | ICD-10-CM

## 2012-05-05 DIAGNOSIS — K219 Gastro-esophageal reflux disease without esophagitis: Secondary | ICD-10-CM | POA: Insufficient documentation

## 2012-05-05 DIAGNOSIS — Z8739 Personal history of other diseases of the musculoskeletal system and connective tissue: Secondary | ICD-10-CM | POA: Insufficient documentation

## 2012-05-05 DIAGNOSIS — R110 Nausea: Secondary | ICD-10-CM | POA: Insufficient documentation

## 2012-05-05 DIAGNOSIS — M129 Arthropathy, unspecified: Secondary | ICD-10-CM | POA: Insufficient documentation

## 2012-05-05 DIAGNOSIS — Z85828 Personal history of other malignant neoplasm of skin: Secondary | ICD-10-CM | POA: Insufficient documentation

## 2012-05-05 DIAGNOSIS — I10 Essential (primary) hypertension: Secondary | ICD-10-CM | POA: Insufficient documentation

## 2012-05-05 DIAGNOSIS — Z8639 Personal history of other endocrine, nutritional and metabolic disease: Secondary | ICD-10-CM | POA: Insufficient documentation

## 2012-05-05 DIAGNOSIS — H9190 Unspecified hearing loss, unspecified ear: Secondary | ICD-10-CM | POA: Insufficient documentation

## 2012-05-05 DIAGNOSIS — Z8781 Personal history of (healed) traumatic fracture: Secondary | ICD-10-CM | POA: Insufficient documentation

## 2012-05-05 DIAGNOSIS — Z8719 Personal history of other diseases of the digestive system: Secondary | ICD-10-CM | POA: Insufficient documentation

## 2012-05-05 DIAGNOSIS — H538 Other visual disturbances: Secondary | ICD-10-CM | POA: Insufficient documentation

## 2012-05-05 DIAGNOSIS — Z862 Personal history of diseases of the blood and blood-forming organs and certain disorders involving the immune mechanism: Secondary | ICD-10-CM | POA: Insufficient documentation

## 2012-05-05 DIAGNOSIS — Z7982 Long term (current) use of aspirin: Secondary | ICD-10-CM | POA: Insufficient documentation

## 2012-05-05 DIAGNOSIS — M546 Pain in thoracic spine: Secondary | ICD-10-CM | POA: Insufficient documentation

## 2012-05-05 LAB — BASIC METABOLIC PANEL
Calcium: 9.7 mg/dL (ref 8.4–10.5)
Creatinine, Ser: 0.68 mg/dL (ref 0.50–1.10)
GFR calc Af Amer: 85 mL/min — ABNORMAL LOW (ref >90)

## 2012-05-05 LAB — CBC
MCH: 29.6 pg (ref 26.0–34.0)
MCHC: 32 g/dL (ref 30.0–36.0)
MCV: 92.3 fL (ref 78.0–100.0)
Platelets: 216 10*3/uL (ref 150–400)
RDW: 14.1 % (ref 11.5–15.5)

## 2012-05-05 LAB — URINALYSIS, ROUTINE W REFLEX MICROSCOPIC
Bilirubin Urine: NEGATIVE
Glucose, UA: NEGATIVE mg/dL
Hgb urine dipstick: NEGATIVE
Nitrite: NEGATIVE
Protein, ur: NEGATIVE mg/dL
Urobilinogen, UA: 0.2 mg/dL (ref 0.0–1.0)

## 2012-05-05 LAB — POCT I-STAT TROPONIN I: Troponin i, poc: 0 ng/mL (ref 0.00–0.08)

## 2012-05-05 LAB — URINE MICROSCOPIC-ADD ON

## 2012-05-05 MED ORDER — DEXTROSE 5 % IV SOLN
1.0000 g | Freq: Once | INTRAVENOUS | Status: AC
  Administered 2012-05-05: 1 g via INTRAVENOUS
  Filled 2012-05-05: qty 10

## 2012-05-05 MED ORDER — SULFAMETHOXAZOLE-TRIMETHOPRIM 800-160 MG PO TABS: 1.0000 | ORAL_TABLET | Freq: Two times a day (BID) | ORAL | Status: DC

## 2012-05-05 MED ORDER — CLONIDINE HCL 0.1 MG PO TABS
0.1000 mg | ORAL_TABLET | Freq: Once | ORAL | Status: AC
  Administered 2012-05-05: 0.1 mg via ORAL
  Filled 2012-05-05: qty 1

## 2012-05-05 NOTE — ED Notes (Addendum)
Pt reports her BP was 213/70 per home monitor, and states she "just doesn't feel well." states she hasnt felt like eating and she felt some she felt some aching in the R side of her face and neck. A&Ox4, resp e/u. Has been taking daily bp meds as prescribed

## 2012-05-05 NOTE — Discharge Instructions (Signed)
Urinary Tract Infection A urinary tract infection (UTI) is often caused by a germ (bacteria). A UTI is usually helped with medicine (antibiotics) that kills germs. Take all the medicine until it is gone. Do this even if you are feeling better. You are usually better in 7 to 10 days. HOME CARE   Drink enough water and fluids to keep your pee (urine) clear or pale yellow. Drink:  Cranberry juice.  Water.  Avoid:  Caffeine.  Tea.  Bubbly (carbonated) drinks.  Alcohol.  Only take medicine as told by your doctor.  To prevent further infections:  Pee often.  After pooping (bowel movement), women should wipe from front to back. Use each tissue only once.  Pee before and after having sex (intercourse). Ask your doctor when your test results will be ready. Make sure you follow up and get your test results.  GET HELP RIGHT AWAY IF:   There is very bad back pain or lower belly (abdominal) pain.  You get the chills.  You have a fever.  Your baby is older than 3 months with a rectal temperature of 102 F (38.9 C) or higher.  Your baby is 19 months old or younger with a rectal temperature of 100.4 F (38 C) or higher.  You feel sick to your stomach (nauseous) or throw up (vomit).  There is continued burning with peeing.  Your problems are not better in 3 days. Return sooner if you are getting worse. MAKE SURE YOU:   Understand these instructions.  Will watch your condition.  Will get help right away if you are not doing well or get worse. Document Released: 08/29/2007 Document Revised: 06/04/2011 Document Reviewed: 08/29/2007 [facility] Patient Information 2013 [place], [place].   Hypertension As your heart beats, it forces blood through your arteries. This force is your blood pressure. If the pressure is too high, it is called hypertension (HTN) or high blood pressure. HTN is dangerous because you may have it and not know it. High blood pressure may mean that your heart  has to work harder to pump blood. Your arteries may be narrow or stiff. The extra work puts you at risk for heart disease, stroke, and other problems.  Blood pressure consists of two numbers, a higher number over a lower, 110/72, for example. It is stated as "110 over 72." The ideal is below 120 for the top number (systolic) and under 80 for the bottom (diastolic). Write down your blood pressure today. You should pay close attention to your blood pressure if you have certain conditions such as:  Heart failure.  Prior heart attack.  Diabetes  Chronic kidney disease.  Prior stroke.  Multiple risk factors for heart disease. To see if you have HTN, your blood pressure should be measured while you are seated with your arm held at the level of the heart. It should be measured at least twice. A one-time elevated blood pressure reading (especially in the Emergency Department) does not mean that you need treatment. There may be conditions in which the blood pressure is different between your right and left arms. It is important to see your caregiver soon for a recheck. Most people have essential hypertension which means that there is not a specific cause. This type of high blood pressure may be lowered by changing lifestyle factors such as:  Stress.  Smoking.  Lack of exercise.  Excessive weight.  Drug/tobacco/alcohol use.  Eating less salt. Most people do not have symptoms from high blood pressure until  it has caused damage to the body. Effective treatment can often prevent, delay or reduce that damage. TREATMENT  When a cause has been identified, treatment for high blood pressure is directed at the cause. There are a large number of medications to treat HTN. These fall into several categories, and your caregiver will help you select the medicines that are best for you. Medications may have side effects. You should review side effects with your caregiver. If your blood pressure stays high  after you have made lifestyle changes or started on medicines,   Your medication(s) may need to be changed.  Other problems may need to be addressed.  Be certain you understand your prescriptions, and know how and when to take your medicine.  Be sure to follow up with your caregiver within the time frame advised (usually within two weeks) to have your blood pressure rechecked and to review your medications.  If you are taking more than one medicine to lower your blood pressure, make sure you know how and at what times they should be taken. Taking two medicines at the same time can result in blood pressure that is too low. SEEK IMMEDIATE MEDICAL CARE IF:  You develop a severe headache, blurred or changing vision, or confusion.  You have unusual weakness or numbness, or a faint feeling.  You have severe chest or abdominal pain, vomiting, or breathing problems. MAKE SURE YOU:   Understand these instructions.  Will watch your condition.  Will get help right away if you are not doing well or get worse. Document Released: 03/12/2005 Document Revised: 06/04/2011 Document Reviewed: 10/31/2007 [facility] Patient Information 2013 [place], [place].

## 2012-05-05 NOTE — ED Provider Notes (Signed)
History  This chart was scribed for [name], MD by Bennett Scrape, ED Scribe. This patient was seen in room A12C/A12C and the patient's care was started at 8:52 PM.  CSN: [id number]  Arrival date & time 05/05/12  1831   First MD Initiated Contact with Patient 05/05/12 1852      Chief Complaint  Patient presents with  . Hypertension    Patient is a 79 y.o. female presenting with hypertension. The history is provided by the patient. No language interpreter was used.  Hypertension Pertinent negatives include no chest pain, no abdominal pain and no shortness of breath.    Pamela [name] is a 79 y.o. female with a h/o HTN who presents to the Emergency Department complaining of hypertension of 213/70 measured by her home monitor with associated visual disturbances described as auras in her right eye and nausea for the past few days. She states that she has been eating and drinking normally. Daughter reports a well-healing sore to the foot and states that the pt just came off of antibiotics. Pt reports upper right-sided back pain that radiates up her right neck attributed to arthritis but denies numbness, CP, abdominal pain and emesis as associated symptoms. She also has a h/o HLD, fibromyalgia and GERD and denies smoking and alcohol use.  Past Medical History  Diagnosis Date  . Hyperlipidemia   . Hypertension   . Fibromyalgia   . Thyroid disease     hyperparathyroid  . PAD (peripheral artery disease)   . TIA (transient ischemic attack) ~ 2009    "only 1 that I know of" (12/13/2011)  . Atrial fibrillation     Not on Coumadin. Noted during 02/2009 admission.   . Skin cancer     Unknown types  . Schatzki's ring     Non obstructing ring on EGD 02/2009   . Pancreatic mass 02/2009    Abnormal CT abdomen, with cystic area in head and circumferential thickening of midbody of stomach  . Fibula fracture 12/11/2011    left  . Stroke 2006    "drags right leg very slightly" (12/13/2011)   . GERD (gastroesophageal reflux disease)   . H/O hiatal hernia   . Hard of hearing     "totally deaf in the right; 50% is gone in the left" (12/13/2011)    Past Surgical History  Procedure Laterality Date  . Appendectomy  1940  . Parathyroidectomy  1980  . Vaginal hysterectomy  1970    Family History  Problem Relation Age of Onset  . Cancer Other     breast ca  . Cancer      mouth ca     History  Substance Use Topics  . Smoking status: Never Smoker   . Smokeless tobacco: Never Used  . Alcohol Use: No    No OB history provided.  Review of Systems  Eyes: Positive for visual disturbance. Negative for pain.  Respiratory: Negative for shortness of breath.   Cardiovascular: Negative for chest pain.  Gastrointestinal: Positive for nausea. Negative for vomiting and abdominal pain.  Neurological: Negative for light-headedness.  All other systems reviewed and are negative.    Allergies  Review of patient's allergies indicates no known allergies.  Home Medications   Current Outpatient Rx  Name  Route  Sig  Dispense  Refill  . amLODipine (NORVASC) 10 MG tablet   Oral   Take 5 mg by mouth daily.         . Ascorbic Acid (  VITAMIN C) 500 MG tablet   Oral   Take 500 mg by mouth daily.           Marland Kitchen aspirin 325 MG tablet   Oral   Take 325 mg by mouth daily.           . calcium carbonate (OS-CAL) 600 MG TABS   Oral   Take 600 mg by mouth daily.           Marland Kitchen HYDROcodone-acetaminophen (NORCO/VICODIN) 5-325 MG per tablet   Oral   Take 0.5 tablets by mouth at bedtime.         Marland Kitchen losartan (COZAAR) 100 MG tablet   Oral   Take 100 mg by mouth daily.         . magnesium oxide (MAG-OX) 400 (241.3 MG) MG tablet      take 1 tablet by mouth once daily   30 tablet   5   . metoprolol tartrate (LOPRESSOR) 25 MG tablet   Oral   Take 25 mg by mouth at bedtime.          . Multiple Vitamins-Minerals (CENTRUM SILVER PO)   Oral   Take 1 tablet by mouth daily.            . ranitidine (ZANTAC) 150 MG tablet   Oral   Take 150 mg by mouth 2 (two) times daily.         Marland Kitchen senna (SENOKOT) 8.6 MG tablet   Oral   Take 1 tablet by mouth 2 (two) times daily. , hold for loose bowels         . sulfamethoxazole-trimethoprim (SEPTRA DS) 800-160 MG per tablet   Oral   Take 1 tablet by mouth 2 (two) times daily.   14 tablet   0     Triage Vitals: BP 178/72  Pulse 81  Temp(Src) 97.9 F (36.6 C) (Oral)  Resp 16  SpO2 99%  Physical Exam  Nursing note and vitals reviewed. Constitutional: She is oriented to person, place, and time. She appears well-developed and well-nourished. No distress.  HENT:  Head: Normocephalic and atraumatic.  Eyes: Conjunctivae and EOM are normal.  Neck: Neck supple. No tracheal deviation present.  No cervical spine tenderness  Cardiovascular: Normal rate and regular rhythm.   Pulmonary/Chest: Effort normal and breath sounds normal. No respiratory distress.  Abdominal: Soft. There is no tenderness.  Musculoskeletal: Normal range of motion.  Neurological: She is alert and oriented to person, place, and time.  Normal finger to nose, no nystagmus, sensation is intact, CN 2 through 12 are intact  Skin: Skin is warm and dry.  Psychiatric: She has a normal mood and affect. Her behavior is normal.    ED Course  Procedures (including critical care time)  DIAGNOSTIC STUDIES: Oxygen Saturation is 99% on room air, normal by my interpretation.    COORDINATION OF CARE: 9:09 PM-Discussed treatment plan which includes CT of head, CBC panel, UA and i-Stat with pt at bedside and pt agreed to plan.   Labs Reviewed  BASIC METABOLIC PANEL - Abnormal; Notable for the following:    Glucose, Bld 105 (*)    BUN 24 (*)    GFR calc non Af Amer 73 (*)    GFR calc Af Amer 85 (*)    All other components within normal limits  URINALYSIS, ROUTINE W REFLEX MICROSCOPIC - Abnormal; Notable for the following:    APPearance TURBID (*)     Ketones, ur 15 (*)  Leukocytes, UA MODERATE (*)    All other components within normal limits  URINE MICROSCOPIC-ADD ON - Abnormal; Notable for the following:    Bacteria, UA MANY (*)    All other components within normal limits  URINE CULTURE  CBC  POCT I-STAT TROPONIN I   Ct Head Wo Contrast  05/05/2012  *RADIOLOGY REPORT*  Clinical Data: Visual disturbances; hypertension.  Nausea.  CT HEAD WITHOUT CONTRAST  Technique:  Contiguous axial images were obtained from the base of the skull through the vertex without contrast.  Comparison: CT of the head performed 04/30/2011  Findings: There is no evidence of acute infarction, mass lesion, or intra- or extra-axial hemorrhage on CT.  Prominence of the ventricles and sulci suggests mild age appropriate cortical volume loss.  Cerebellar atrophy is noted. Scattered periventricular and subcortical white matter change likely reflects small vessel ischemic microangiopathy.  Chronic lacunar infarcts are seen within the left basal ganglia and left thalamus.  The brainstem and fourth ventricle are within normal limits.  The cerebral hemispheres demonstrate grossly normal gray-white differentiation.  No mass effect or midline shift is seen.   There is no evidence of fracture; visualized osseous structures are unremarkable in appearance.  The visualized portions of the orbits are within normal limits.  The paranasal sinuses and mastoid air cells are well-aerated.  No significant soft tissue abnormalities are seen.  IMPRESSION:  1.  No acute intracranial pathology seen on CT. 2.  Mild age appropriate cortical volume loss and scattered small vessel ischemic microangiopathy. 3.  Chronic lacunar infarcts within the left basal ganglia and left thalamus.   Original Report Authenticated By: Tonia Ghent, M.D.      1. Urinary tract infection   2. Hypertension      Date: 05/05/2012  Rate: 67  Rhythm: normal sinus rhythm  QRS Axis: normal  Intervals: normal  ST/T  Wave abnormalities: normal  Conduction Disutrbances:none  Narrative Interpretation:   Old EKG Reviewed: unchanged    MDM  I personally performed the services described in this documentation, which was scribed in my presence. The recorded information has been reviewed and is accurate.     [name], MD 05/06/12 [phone]

## 2012-05-07 LAB — URINE CULTURE: Colony Count: >=100000

## 2012-05-08 ENCOUNTER — Telehealth (HOSPITAL_COMMUNITY): Payer: Self-pay | Admitting: Emergency Medicine

## 2012-05-08 NOTE — ED Notes (Signed)
Positive URNC- Pt Resistant to Septra DS. Chart sent to EDP office for review.

## 2012-05-11 ENCOUNTER — Telehealth (HOSPITAL_COMMUNITY): Payer: Self-pay | Admitting: Emergency Medicine

## 2012-05-20 ENCOUNTER — Encounter: Payer: Self-pay | Admitting: Family Medicine

## 2012-05-20 ENCOUNTER — Ambulatory Visit (INDEPENDENT_AMBULATORY_CARE_PROVIDER_SITE_OTHER): Payer: Medicare Other | Admitting: Family Medicine

## 2012-05-20 VITALS — BP 130/78 | HR 68 | Temp 97.7°F | Wt 119.0 lb

## 2012-05-20 DIAGNOSIS — N390 Urinary tract infection, site not specified: Secondary | ICD-10-CM

## 2012-05-20 DIAGNOSIS — I10 Essential (primary) hypertension: Secondary | ICD-10-CM

## 2012-05-20 LAB — POCT URINALYSIS DIPSTICK
Bilirubin, UA: NEGATIVE
Nitrite, UA: NEGATIVE
Protein, UA: NEGATIVE
pH, UA: 6

## 2012-05-20 NOTE — Addendum Note (Signed)
Addended by: Eliezer Bottom on: 05/20/2012 12:09 PM   Modules accepted: Orders

## 2012-05-20 NOTE — Progress Notes (Addendum)
HPI: Patient is a very pleasant 79 y.o. Caucasian female with h/o recurrent UTIs/pyelonephritis who is here with her niece, Pamela [name] for ER follow up.  Notes reviewed.  Presented to [facility] ER on 05/05/2012 with hypertension of 213/70 measured by her home monitor with associated visual disturbances described as auras in her right eye and nausea for several days duration.  Head CT was neg. Ct Head Wo Contrast  05/05/2012  *RADIOLOGY REPORT*  Clinical Data: Visual disturbances; hypertension.  Nausea.  CT HEAD WITHOUT CONTRAST  Technique:  Contiguous axial images were obtained from the base of the skull through the vertex without contrast.  Comparison: CT of the head performed 04/30/2011  Findings: There is no evidence of acute infarction, mass lesion, or intra- or extra-axial hemorrhage on CT.  Prominence of the ventricles and sulci suggests mild age appropriate cortical volume loss.  Cerebellar atrophy is noted. Scattered periventricular and subcortical white matter change likely reflects small vessel ischemic microangiopathy.  Chronic lacunar infarcts are seen within the left basal ganglia and left thalamus.  The brainstem and fourth ventricle are within normal limits.  The cerebral hemispheres demonstrate grossly normal gray-white differentiation.  No mass effect or midline shift is seen.   There is no evidence of fracture; visualized osseous structures are unremarkable in appearance.  The visualized portions of the orbits are within normal limits.  The paranasal sinuses and mastoid air cells are well-aerated.  No significant soft tissue abnormalities are seen.  IMPRESSION:  1.  No acute intracranial pathology seen on CT. 2.  Mild age appropriate cortical volume loss and scattered small vessel ischemic microangiopathy. 3.  Chronic lacunar infarcts within the left basal ganglia and left thalamus.   Original Report Authenticated By: Tonia Ghent, M.D.    UA positive- sent home on Bactrim. Urine Cx grew >100,000  E coli resistant to Bactrim.  Pamela [name] was called with cx results- bactrim d/c'd and given 5 day course of cipro Cipro 500 mg PO BID.  No dysuria but did not have any when she presented to ER or with previous UTIs.  She is currently normotensive.  She states she has had some elevated readings at home and frequently has some tingling on left side since her stroke years ago.  Past Medical History  Diagnosis Date  . Hyperlipidemia   . Hypertension   . Fibromyalgia   . Thyroid disease     hyperparathyroid  . PAD (peripheral artery disease)   . TIA (transient ischemic attack) ~ 2009    "only 1 that I know of" (12/13/2011)  . Atrial fibrillation     Not on Coumadin. Noted during 02/2009 admission.   . Skin cancer     Unknown types  . Schatzki's ring     Non obstructing ring on EGD 02/2009   . Pancreatic mass 02/2009    Abnormal CT abdomen, with cystic area in head and circumferential thickening of midbody of stomach  . Fibula fracture 12/11/2011    left  . Stroke 2006    "drags right leg very slightly" (12/13/2011)  . GERD (gastroesophageal reflux disease)   . H/O hiatal hernia   . Hard of hearing     "totally deaf in the right; 50% is gone in the left" (12/13/2011)    MEDS:  Outpatient Prescriptions Prior to Visit  Medication Sig Dispense Refill  . amLODipine (NORVASC) 10 MG tablet Take 5 mg by mouth daily.      . Ascorbic Acid (VITAMIN C) 500 MG tablet Take  500 mg by mouth daily.        Marland Kitchen aspirin 325 MG tablet Take 325 mg by mouth daily.        . calcium carbonate (OS-CAL) 600 MG TABS Take 600 mg by mouth daily.        Marland Kitchen HYDROcodone-acetaminophen (NORCO/VICODIN) 5-325 MG per tablet Take 0.5 tablets by mouth at bedtime.      Marland Kitchen losartan (COZAAR) 100 MG tablet Take 100 mg by mouth daily.      . magnesium oxide (MAG-OX) 400 (241.3 MG) MG tablet take 1 tablet by mouth once daily  30 tablet  5  . metoprolol tartrate (LOPRESSOR) 25 MG tablet Take 25 mg by mouth at bedtime.       . Multiple  Vitamins-Minerals (CENTRUM SILVER PO) Take 1 tablet by mouth daily.        . ranitidine (ZANTAC) 150 MG tablet Take 150 mg by mouth 2 (two) times daily.      Marland Kitchen senna (SENOKOT) 8.6 MG tablet Take 1 tablet by mouth 2 (two) times daily. , hold for loose bowels      . sulfamethoxazole-trimethoprim (SEPTRA DS) 800-160 MG per tablet Take 1 tablet by mouth 2 (two) times daily.  14 tablet  0   No facility-administered medications prior to visit.   ROS: See HPI No CP, SOB or blurred vision No dysuria or increased urinary frequency   PE: BP 130/78  Temp(Src) 97.7 F (36.5 C)  Wt 119 lb (53.978 kg)  BMI 19.22 kg/m2  Gen: alert, pleasant, well appearing. CV: RRR, no m/r/g.   LUNGS: CTA bilat, nonlabored resps, good aeration in all lung fields. ABD: soft, NT, ND, BS normal.  No hepatospenomegaly or mass.  No bruits. EXT: no edema, but she has a superficial ulceration with mild erythema on right ankle medial mallolus region, similar ulceration without erythema or drainage on left calcaneal surface, and a linear-type superficial skin ulceration with minimal surrounding erythema on medial dorsal surface of left foot.  None of these areas has much tenderness.  No areas of fluctuance. BACK: no CVA tenderness   IMPRESSION AND PLAN: 1. UTI (lower urinary tract infection) S/p cipro x 5 days.  UA pos for only trace LE.  Send for cx.  No further rx needed.   2. HYPERTENSION Now normotensive.  Likely secondary to acute infection.

## 2012-05-20 NOTE — Patient Instructions (Signed)
I think your blood pressure cuff is likely not working. You can come in to have your blood pressure checked with your cuff to make sure it is working.  Your urine looked good today.

## 2012-05-22 ENCOUNTER — Ambulatory Visit (INDEPENDENT_AMBULATORY_CARE_PROVIDER_SITE_OTHER): Payer: Medicare Other | Admitting: Family Medicine

## 2012-05-22 ENCOUNTER — Encounter: Payer: Self-pay | Admitting: Family Medicine

## 2012-05-22 VITALS — BP 130/50 | HR 92 | Temp 97.5°F

## 2012-05-22 DIAGNOSIS — I10 Essential (primary) hypertension: Secondary | ICD-10-CM

## 2012-05-22 LAB — URINE CULTURE

## 2012-05-22 NOTE — Progress Notes (Signed)
  Subjective:    Patient ID: Pamela [name], female    DOB: May 23, 1916, 79 y.o.   MRN: [id number]  HPI  79 yo here with niece to check her home BP cuff.  BP checked here and her cuff does appear to be inaccurate.  Review of Systems     Objective:   Physical Exam BP 130/50  Pulse 92  Temp(Src) 97.5 F (36.4 C)        Assessment & Plan:  Advised getting home cuff recalibrated. The patient indicates understanding of these issues and agrees with the plan.

## 2012-07-01 ENCOUNTER — Telehealth (INDEPENDENT_AMBULATORY_CARE_PROVIDER_SITE_OTHER): Payer: Medicare Other | Admitting: *Deleted

## 2012-07-01 ENCOUNTER — Ambulatory Visit: Payer: Medicare Other | Admitting: Family Medicine

## 2012-07-01 DIAGNOSIS — N390 Urinary tract infection, site not specified: Secondary | ICD-10-CM

## 2012-07-01 DIAGNOSIS — R300 Dysuria: Secondary | ICD-10-CM

## 2012-07-01 LAB — POCT URINALYSIS DIPSTICK
Bilirubin, UA: NEGATIVE
Blood, UA: NEGATIVE
Glucose, UA: NEGATIVE
Spec Grav, UA: <=1.005

## 2012-07-01 NOTE — Telephone Encounter (Signed)
Culture sent 

## 2012-07-01 NOTE — Telephone Encounter (Signed)
Pt's niece dropped off urine sample today, saying that you had told her to drop off sample 6 weeks after last visit, since pt seems to get UTI's about every 6 weeks.  Please see UA results.  Do you want to send culture?

## 2012-07-01 NOTE — Telephone Encounter (Signed)
Yes please send for cx.

## 2012-07-03 ENCOUNTER — Other Ambulatory Visit: Payer: Self-pay | Admitting: Family Medicine

## 2012-07-03 LAB — URINE CULTURE: Colony Count: >=100000

## 2012-07-03 MED ORDER — NITROFURANTOIN MONOHYD MACRO 100 MG PO CAPS: 100.0000 mg | ORAL_CAPSULE | Freq: Two times a day (BID) | ORAL | Status: AC

## 2012-07-25 ENCOUNTER — Other Ambulatory Visit: Payer: Self-pay | Admitting: Family Medicine

## 2012-08-20 ENCOUNTER — Other Ambulatory Visit: Payer: Self-pay | Admitting: Family Medicine

## 2012-08-21 ENCOUNTER — Ambulatory Visit (INDEPENDENT_AMBULATORY_CARE_PROVIDER_SITE_OTHER): Payer: Medicare Other | Admitting: Family Medicine

## 2012-08-21 ENCOUNTER — Encounter: Payer: Self-pay | Admitting: Family Medicine

## 2012-08-21 VITALS — BP 150/80 | HR 72 | Temp 97.8°F | Wt 121.0 lb

## 2012-08-21 DIAGNOSIS — R300 Dysuria: Secondary | ICD-10-CM

## 2012-08-21 DIAGNOSIS — L539 Erythematous condition, unspecified: Secondary | ICD-10-CM

## 2012-08-21 LAB — POCT URINALYSIS DIPSTICK
Glucose, UA: NEGATIVE
Nitrite, UA: NEGATIVE
Urobilinogen, UA: NEGATIVE

## 2012-08-21 MED ORDER — SULFAMETHOXAZOLE-TMP DS 800-160 MG PO TABS: 1.0000 | ORAL_TABLET | Freq: Two times a day (BID) | ORAL | Status: DC

## 2012-08-21 NOTE — Addendum Note (Signed)
Addended by: Eliezer Bottom on: 08/21/2012 12:05 PM   Modules accepted: Orders

## 2012-08-21 NOTE — Patient Instructions (Addendum)
Good to see you. Please do not soak your feet.  Take antibiotic as directed- 1 tablet twice daily for 7 days.  Ok to leave wound open.  If it starts to drain, please apply band aid with antibiotic ointment.    Please call me next week with an update.

## 2012-08-21 NOTE — Progress Notes (Signed)
Very pleasant 79 yo female who is here with her niece for left foot redness.  Hx of old left foot injury, wound on dorsum, has opened and closed multiple times.  Was being seen at [facility] wound center until it healed. Soaked her feet in epsom salts over the weekend.  Since then left foot is warm and tender. No fevers or chills. No drainage.  H/o recurrent UTIs- currently asymptomatic. Pertinent PMH:  Past Medical History  Diagnosis Date  . Hyperlipidemia   . Hypertension   . Fibromyalgia   . Thyroid disease     hyperparathyroid  . PAD (peripheral artery disease)   . TIA (transient ischemic attack) ~ 2009    "only 1 that I know of" (12/13/2011)  . Atrial fibrillation     Not on Coumadin. Noted during 02/2009 admission.   . Skin cancer     Unknown types  . Schatzki's ring     Non obstructing ring on EGD 02/2009   . Pancreatic mass 02/2009    Abnormal CT abdomen, with cystic area in head and circumferential thickening of midbody of stomach  . Fibula fracture 12/11/2011    left  . Stroke 2006    "drags right leg very slightly" (12/13/2011)  . GERD (gastroesophageal reflux disease)   . H/O hiatal hernia   . Hard of hearing     "totally deaf in the right; 50% is gone in the left" (12/13/2011)    MEDS:  Outpatient Prescriptions Prior to Visit  Medication Sig Dispense Refill  . amLODipine (NORVASC) 10 MG tablet Take 5 mg by mouth daily.      Marland Kitchen amLODipine (NORVASC) 10 MG tablet take 1/2 tablet by mouth once daily  15 tablet  5  . Ascorbic Acid (VITAMIN C) 500 MG tablet Take 500 mg by mouth daily.        Marland Kitchen aspirin 325 MG tablet Take 325 mg by mouth daily.        . calcium carbonate (OS-CAL) 600 MG TABS Take 600 mg by mouth daily.        Marland Kitchen HYDROcodone-acetaminophen (NORCO/VICODIN) 5-325 MG per tablet Take 0.5 tablets by mouth at bedtime.      Marland Kitchen losartan (COZAAR) 100 MG tablet Take 100 mg by mouth daily.      . magnesium oxide (MAG-OX) 400 (241.3 MG) MG tablet take 1 tablet by mouth once  daily  30 tablet  5  . metoprolol tartrate (LOPRESSOR) 25 MG tablet Take 25 mg by mouth at bedtime.       . Multiple Vitamins-Minerals (CENTRUM SILVER PO) Take 1 tablet by mouth daily.        . ranitidine (ZANTAC) 150 MG tablet Take 150 mg by mouth 2 (two) times daily.      . ranitidine (ZANTAC) 150 MG tablet take 1 tablet by mouth twice a day  60 tablet  5  . senna (SENOKOT) 8.6 MG tablet Take 1 tablet by mouth 2 (two) times daily. , hold for loose bowels       No facility-administered medications prior to visit.    PE: BP 150/80  Pulse 72  Temp(Src) 97.8 F (36.6 C)  Wt 121 lb (54.885 kg)  BMI 19.54 kg/m2  Gen: alert, pleasant, well appearing. CV: RRR, no m/r/g.   LUNGS: CTA bilat, nonlabored resps, good aeration in all lung fields. ABD: soft, NT, ND, BS normal.  No hepatospenomegaly or mass.  No bruits. EXT: no edema, but she has a superficial erythema and  warmth of top of left foot with involvement of right ankle, no weeping or open wound. None of these areas has much tenderness.  No areas of fluctuance.   Assessment and Plan:  1. Erythema of foot-  Consistent with cellulitis.  With treat with 7 days of Bactrim- 1 tab po twice daily.  2.  H/o UTI-  Does have trace of LE on UA but asymptomatic.  Will send for cx.

## 2012-08-22 LAB — URINE CULTURE
Colony Count: NO GROWTH
Organism ID, Bacteria: NO GROWTH

## 2012-08-29 ENCOUNTER — Other Ambulatory Visit: Payer: Self-pay | Admitting: Family Medicine

## 2012-09-08 ENCOUNTER — Other Ambulatory Visit: Payer: Self-pay | Admitting: Family Medicine

## 2012-09-11 ENCOUNTER — Other Ambulatory Visit: Payer: Self-pay | Admitting: Family Medicine

## 2012-09-12 NOTE — Telephone Encounter (Signed)
Metoprolol called to [facility].

## 2012-10-03 ENCOUNTER — Encounter (HOSPITAL_BASED_OUTPATIENT_CLINIC_OR_DEPARTMENT_OTHER): Payer: Medicare Other | Attending: General Surgery

## 2012-10-03 DIAGNOSIS — L97909 Non-pressure chronic ulcer of unspecified part of unspecified lower leg with unspecified severity: Secondary | ICD-10-CM | POA: Insufficient documentation

## 2012-10-24 ENCOUNTER — Encounter (HOSPITAL_BASED_OUTPATIENT_CLINIC_OR_DEPARTMENT_OTHER): Payer: Medicare Other | Attending: General Surgery

## 2012-10-24 DIAGNOSIS — I87319 Chronic venous hypertension (idiopathic) with ulcer of unspecified lower extremity: Secondary | ICD-10-CM | POA: Insufficient documentation

## 2012-10-24 DIAGNOSIS — L97909 Non-pressure chronic ulcer of unspecified part of unspecified lower leg with unspecified severity: Secondary | ICD-10-CM | POA: Insufficient documentation

## 2012-12-17 ENCOUNTER — Encounter: Payer: Self-pay | Admitting: Internal Medicine

## 2012-12-17 ENCOUNTER — Ambulatory Visit (INDEPENDENT_AMBULATORY_CARE_PROVIDER_SITE_OTHER): Payer: Medicare Other | Admitting: Internal Medicine

## 2012-12-17 VITALS — BP 110/74 | HR 70 | Temp 97.3°F | Wt 125.8 lb

## 2012-12-17 DIAGNOSIS — R300 Dysuria: Secondary | ICD-10-CM

## 2012-12-17 DIAGNOSIS — R3915 Urgency of urination: Secondary | ICD-10-CM

## 2012-12-17 DIAGNOSIS — R350 Frequency of micturition: Secondary | ICD-10-CM

## 2012-12-17 LAB — POCT URINALYSIS DIPSTICK
Bilirubin, UA: NEGATIVE
Nitrite, UA: NEGATIVE
Urobilinogen, UA: 2
pH, UA: 7

## 2012-12-17 MED ORDER — CIPROFLOXACIN HCL 250 MG PO TABS: 250.0000 mg | ORAL_TABLET | Freq: Two times a day (BID) | ORAL | Status: DC

## 2012-12-17 NOTE — Patient Instructions (Signed)
Urinary Tract Infection  Urinary tract infections (UTIs) can develop anywhere along your urinary tract. Your urinary tract is your body's drainage system for removing wastes and extra water. Your urinary tract includes two kidneys, two ureters, a bladder, and a urethra. Your kidneys are a pair of bean-shaped organs. Each kidney is about the size of your fist. They are located below your ribs, one on each side of your spine.  CAUSES  Infections are caused by microbes, which are microscopic organisms, including fungi, viruses, and bacteria. These organisms are so small that they can only be seen through a microscope. Bacteria are the microbes that most commonly cause UTIs.  SYMPTOMS   Symptoms of UTIs may vary by age and gender of the patient and by the location of the infection. Symptoms in young women typically include a frequent and intense urge to urinate and a painful, burning feeling in the bladder or urethra during urination. Older women and men are more likely to be tired, shaky, and weak and have muscle aches and abdominal pain. A fever may mean the infection is in your kidneys. Other symptoms of a kidney infection include pain in your back or sides below the ribs, nausea, and vomiting.  DIAGNOSIS  To diagnose a UTI, your caregiver will ask you about your symptoms. Your caregiver also will ask to provide a urine sample. The urine sample will be tested for bacteria and white blood cells. White blood cells are made by your body to help fight infection.  TREATMENT   Typically, UTIs can be treated with medication. Because most UTIs are caused by a bacterial infection, they usually can be treated with the use of antibiotics. The choice of antibiotic and length of treatment depend on your symptoms and the type of bacteria causing your infection.  HOME CARE INSTRUCTIONS   If you were prescribed antibiotics, take them exactly as your caregiver instructs you. Finish the medication even if you feel better after you  have only taken some of the medication.   Drink enough water and fluids to keep your urine clear or pale yellow.   Avoid caffeine, tea, and carbonated beverages. They tend to irritate your bladder.   Empty your bladder often. Avoid holding urine for long periods of time.   Empty your bladder before and after sexual intercourse.   After a bowel movement, women should cleanse from front to back. Use each tissue only once.  SEEK MEDICAL CARE IF:    You have back pain.   You develop a fever.   Your symptoms do not begin to resolve within 3 days.  SEEK IMMEDIATE MEDICAL CARE IF:    You have severe back pain or lower abdominal pain.   You develop chills.   You have nausea or vomiting.   You have continued burning or discomfort with urination.  MAKE SURE YOU:    Understand these instructions.   Will watch your condition.   Will get help right away if you are not doing well or get worse.  Document Released: 12/20/2004 Document Revised: 09/11/2011 Document Reviewed: 04/20/2011  [employer] Patient Information 2014 [employer]

## 2012-12-17 NOTE — Addendum Note (Signed)
Addended by: Criselda Peaches B on: 12/17/2012 03:00 PM   Modules accepted: Orders

## 2012-12-17 NOTE — Progress Notes (Signed)
HPI  Pt presents to the clinic today with c/o urinary frequency, urgency and dysuria. This started 1 week ago. She denies fever, chills, nausea or back pain. She has not tried anything OTC. She has a history of recurrent UTI and reports this feels the same. She is unable to provide a urine sample.   Review of Systems  Past Medical History  Diagnosis Date  . Hyperlipidemia   . Hypertension   . Fibromyalgia   . Thyroid disease     hyperparathyroid  . PAD (peripheral artery disease)   . TIA (transient ischemic attack) ~ Aug 07, 2007    "only 1 that I know of" (12/13/2011)  . Atrial fibrillation     Not on Coumadin. Noted during 02/2009 admission.   . Skin cancer     Unknown types  . Schatzki's ring     Non obstructing ring on EGD 02/2009   . Pancreatic mass 02/2009    Abnormal CT abdomen, with cystic area in head and circumferential thickening of midbody of stomach  . Fibula fracture 12/11/2011    left  . Stroke 08-06-04    "drags right leg very slightly" (12/13/2011)  . GERD (gastroesophageal reflux disease)   . H/O hiatal hernia   . Hard of hearing     "totally deaf in the right; 50% is gone in the left" (12/13/2011)    Family History  Problem Relation Age of Onset  . Cancer Other     breast ca  . Cancer      mouth ca     History   Social History  . Marital Status: Widowed    Spouse Name: N/A    Number of Children: N/A  . Years of Education: N/A   Occupational History  . Not on file.   Social History Main Topics  . Smoking status: Never Smoker   . Smokeless tobacco: Never Used  . Alcohol Use: No  . Drug Use: No  . Sexual Activity: No   Other Topics Concern  . Not on file   Social History Narrative   Still living at home alone, has had 2 husbands, both passed away, last in 1998-08-07. Has great niece [name] who is Power of Attorney [phone]. Large family. Still active, still does her yardwork, no cane no walker.              No Known  Allergies  Constitutional: Denies fever, malaise, fatigue, headache or abrupt weight changes.   GU: Pt reports urgency, frequency and pain with urination. Denies burning sensation, blood in urine, odor or discharge. Skin: Denies redness, rashes, lesions or ulcercations.   No other specific complaints in a complete review of systems (except as listed in HPI above).    Objective:   Physical Exam  Temp(Src) 97.3 F (36.3 C) (Oral)  Wt 125 lb 12 oz (57.04 kg)  BMI 20.31 kg/m2 Wt Readings from Last 3 Encounters:  12/17/12 125 lb 12 oz (57.04 kg)  08/21/12 121 lb (54.885 kg)  05/20/12 119 lb (53.978 kg)    General: Appears her stated age, well developed, well nourished in NAD. Cardiovascular: Normal rate and rhythm. S1,S2 noted.  No murmur, rubs or gallops noted. No JVD or BLE edema. No carotid bruits noted. Pulmonary/Chest: Normal effort and positive vesicular breath sounds. No respiratory distress. No wheezes, rales or ronchi noted.  Abdomen: Soft and nontender. Normal bowel sounds, no bruits noted. No distention or masses noted. Liver, spleen and kidneys non  palpable. Tender to palpation over the bladder area. No CVA tenderness.      Assessment & Plan:   Urgency, frequency and dysuria, secondary to presumed UTI:  eRx sent if for Cipro 250  mg BID x 5 days Drink plenty of fluids  RTC as needed or if symptoms persist.

## 2012-12-24 ENCOUNTER — Telehealth: Payer: Self-pay

## 2012-12-24 NOTE — Telephone Encounter (Signed)
She had adequate therapy. If she wants we can recheck her urine but likely incontinence is due to aging and decreased bladder tone.

## 2012-12-24 NOTE — Telephone Encounter (Signed)
Spoke with pt's niece.  She's going to give pt a few more days and call back on Monday if she hasn't improved.

## 2012-12-24 NOTE — Telephone Encounter (Signed)
Left message for pt's niece to return call.

## 2012-12-24 NOTE — Telephone Encounter (Signed)
Pamela [name] pts niece; pt seen 12/17/12; pt is leaking urine, pt does not usually have problem with leakage unless UTI. Pt is still voiding frequently with sense of urgency. Pt finished cipro on 12/22/12. [facility] [employer]. Please advise. [name] request cb.

## 2013-01-16 ENCOUNTER — Telehealth: Payer: Self-pay

## 2013-01-16 NOTE — Telephone Encounter (Signed)
[name] pts daughter, said pt took BP this AM 173/70 and this afternoon 198/75 P 69. Pt does not feel good and sleeping a lot since yesterday and pt has no energy. [name] is not sure if any UTI symptoms or fever. no CP, SOB,H/A, dizziness,or confusion. Pt has frequent UTIs and has BP cuff issues in the past. No available appts at office and I checked with [facility] and no available appts. Spoke with Dr Milinda Antis and was advised [facility] or if needs to be seen today to go to UC. [name] voiced understanding and will take to UC.

## 2013-01-16 NOTE — Telephone Encounter (Signed)
Sorry I could not fit her in at the end of the day due to the computer shut down

## 2013-02-02 ENCOUNTER — Ambulatory Visit (INDEPENDENT_AMBULATORY_CARE_PROVIDER_SITE_OTHER): Payer: Medicare Other | Admitting: Family Medicine

## 2013-02-02 ENCOUNTER — Encounter: Payer: Self-pay | Admitting: Family Medicine

## 2013-02-02 VITALS — BP 160/70 | HR 65 | Temp 98.2°F | Ht 64.0 in | Wt 127.8 lb

## 2013-02-02 DIAGNOSIS — M205X9 Other deformities of toe(s) (acquired), unspecified foot: Secondary | ICD-10-CM

## 2013-02-02 DIAGNOSIS — L84 Corns and callosities: Secondary | ICD-10-CM

## 2013-02-02 DIAGNOSIS — M21619 Bunion of unspecified foot: Secondary | ICD-10-CM

## 2013-02-02 DIAGNOSIS — E65 Localized adiposity: Secondary | ICD-10-CM

## 2013-02-02 MED ORDER — UREA 40 % EX CREA: TOPICAL_CREAM | CUTANEOUS | Status: DC

## 2013-02-02 NOTE — Progress Notes (Signed)
Date:  02/02/2013   Name:  Pamela [name]   DOB:  1917-02-18   MRN:  [id number] Gender: female Age: 79 y.o.  Primary Physician:  Ruthe Mannan, MD   Chief Complaint: Foot Pain   Subjective:   History of Present Illness:  Pamela [name] is a 79 y.o. very pleasant female patient who presents with the following:  Very pleasant elderly female who presents with heel pain and she is also having some pain at her bunionette laterally on the R 5th as well as the base of the 5th metatarsal which have become more prominent over time. She is age 79. She also has a very prominent callus on the bottom of her calcaneus. This is relatively tender to palpation. It is causing her some discomfort in her day-to-day life. If  Past Medical History, Surgical History, Social History, Family History, Problem List, Medications, and Allergies have been reviewed and updated if relevant.  Review of Systems:  GEN: No acute illnesses, no fevers, chills. GI: No n/v/d, eating normally Pulm: No SOB Interactive and getting along well at home.  Otherwise, ROS is as per the HPI.  Objective:   Physical Examination: BP 160/70  Pulse 65  Temp(Src) 98.2 F (36.8 C) (Oral)  Ht 5\' 4"  (1.626 m)  Wt 127 lb 12 oz (57.947 kg)  BMI 21.92 kg/m2   GEN: WDWN, NAD, Non-toxic, A & O x 3 HEENT: Atraumatic, Normocephalic. Neck supple. No masses, No LAD. Ears and Nose: No external deformity. EXTR: No c/c/e PSYCH: Normally interactive. Conversant. Not depressed or anxious appearing.  Calm demeanor.   RIGHT foot is notable for Morton's foot position. She does have a very prominent bunionette laterally and a bunion medially. She also has a prominent 5th metatarsal base. Calcaneus is grossly nontender. All other bones are nontender. The bottom of the calcaneus is tender to palpation mostly around an area of very thick callus. ATFL, CFL, and deltoid ligaments are intact.  Assessment & Plan:    Fat pad syndrome  Bunionette,  unspecified laterality  Callus of heel  Augustine crafted her a pad that was orthopedic felt and gave her also some to try to help pad the lateral aspect of her shoe. I think more than anything, taking care of her callus will help more than anything else. Given advice of this. I think she can do this on her own.  There are no Patient Instructions on file for this visit.  Orders Today:  No orders of the defined types were placed in this encounter.    New medications, updates to list, dose adjustments: Meds ordered this encounter  Medications  . urea (CARMOL) 40 % CREA    Sig: Apply bid to heel, disp 30 grams    Dispense:  30 each    Refill:  4    Signed,  Mack Alvidrez T. Carreen Milius, MD, CAQ Sports Medicine  [facility] at [facility] [address] [place] [address] Phone: [phone] Fax: [phone]  Updated Complete Medication List:   Medication List       This list is accurate as of: 02/02/13 11:59 PM.  Always use your most recent med list.               amLODipine 10 MG tablet  Commonly known as:  NORVASC  take 1/2 tablet by mouth once daily     aspirin 325 MG tablet  Take 325 mg by mouth daily.     calcium carbonate 600  MG Tabs tablet  Commonly known as:  OS-CAL  Take 600 mg by mouth daily.     CENTRUM SILVER PO  Take 1 tablet by mouth daily.     losartan 100 MG tablet  Commonly known as:  COZAAR  take 1 tablet by mouth daily     magnesium oxide 400 (241.3 MG) MG tablet  Commonly known as:  MAG-OX  take 1 tablet by mouth once daily     metoprolol tartrate 25 MG tablet  Commonly known as:  LOPRESSOR  take 1 tablet by mouth at bedtime     ranitidine 150 MG tablet  Commonly known as:  ZANTAC  take 1 tablet by mouth twice a day     senna 8.6 MG tablet  Commonly known as:  SENOKOT  Take 1 tablet by mouth daily. , hold for loose bowels     urea 40 % Crea  Commonly known as:  CARMOL  Apply bid to heel, disp 30 grams     vitamin C 500  MG tablet  Commonly known as:  ASCORBIC ACID  Take 500 mg by mouth daily.

## 2013-02-02 NOTE — Progress Notes (Signed)
Pre-visit discussion using our clinic review tool. No additional management support is needed unless otherwise documented below in the visit note.  

## 2013-02-12 ENCOUNTER — Telehealth: Payer: Self-pay

## 2013-02-12 MED ORDER — AMLODIPINE BESYLATE 10 MG PO TABS: ORAL_TABLET | ORAL | Status: DC

## 2013-02-12 NOTE — Telephone Encounter (Signed)
Pt came by office to get refill amlodipine refilled # 15 x 1; pt was recently seen but not for BP and pt wants to see Dr Dayton Martes for BP f/u. Pt scheduled appt to see Dr Dayton Martes 04/10/13. Pt advised refill done while at office. If pt has condition to change or worsen prior to appt pt will contact our office.

## 2013-03-02 ENCOUNTER — Other Ambulatory Visit: Payer: Self-pay | Admitting: Family Medicine

## 2013-03-10 ENCOUNTER — Telehealth: Payer: Self-pay | Admitting: Family Medicine

## 2013-03-10 MED ORDER — LOSARTAN POTASSIUM 100 MG PO TABS: ORAL_TABLET | ORAL | Status: DC

## 2013-03-10 NOTE — Telephone Encounter (Signed)
Pt came by and is needing refill on Losartin Potassium 100 mg tablets. Pt uses [facility] in [place]

## 2013-03-10 NOTE — Telephone Encounter (Signed)
1 refill done with note pt needs to call for appt.

## 2013-03-20 ENCOUNTER — Other Ambulatory Visit: Payer: Self-pay

## 2013-03-20 MED ORDER — RANITIDINE HCL 150 MG PO TABS: ORAL_TABLET | ORAL | Status: DC

## 2013-03-20 NOTE — Telephone Encounter (Signed)
Pt came to office for refill ranitidine to [facility] [address]. Advised done and pt to keep appt 03/2013 with Dr Dayton Martes.

## 2013-04-09 ENCOUNTER — Other Ambulatory Visit: Payer: Self-pay

## 2013-04-09 MED ORDER — METOPROLOL TARTRATE 25 MG PO TABS: ORAL_TABLET | ORAL | Status: DC

## 2013-04-09 MED ORDER — LOSARTAN POTASSIUM 100 MG PO TABS: ORAL_TABLET | ORAL | Status: DC

## 2013-04-09 NOTE — Telephone Encounter (Signed)
Linda at front desk said pt request refill losartan and metoprolol to [facility] [place].. Pt has appt Dr Deborra Medina on 04/16/13 but is out of medication today.pt advised refilled x 1.

## 2013-04-10 ENCOUNTER — Ambulatory Visit: Payer: Medicare Other | Admitting: Family Medicine

## 2013-04-16 ENCOUNTER — Ambulatory Visit (INDEPENDENT_AMBULATORY_CARE_PROVIDER_SITE_OTHER): Payer: Medicare Other | Admitting: Family Medicine

## 2013-04-16 ENCOUNTER — Encounter: Payer: Self-pay | Admitting: Family Medicine

## 2013-04-16 VITALS — BP 172/74 | HR 66 | Temp 97.7°F | Wt 129.5 lb

## 2013-04-16 DIAGNOSIS — H9319 Tinnitus, unspecified ear: Secondary | ICD-10-CM

## 2013-04-16 DIAGNOSIS — H9313 Tinnitus, bilateral: Secondary | ICD-10-CM

## 2013-04-16 DIAGNOSIS — I10 Essential (primary) hypertension: Secondary | ICD-10-CM

## 2013-04-16 DIAGNOSIS — Z87448 Personal history of other diseases of urinary system: Secondary | ICD-10-CM

## 2013-04-16 MED ORDER — LOSARTAN POTASSIUM 100 MG PO TABS: ORAL_TABLET | ORAL | Status: DC

## 2013-04-16 MED ORDER — METOPROLOL TARTRATE 25 MG PO TABS: ORAL_TABLET | ORAL | Status: DC

## 2013-04-16 MED ORDER — AMLODIPINE BESYLATE 10 MG PO TABS: ORAL_TABLET | ORAL | Status: DC

## 2013-04-16 NOTE — Progress Notes (Signed)
Subjective:    Patient ID: Pamela [name], female    DOB: [birth year], 80 y.o.   MRN: [id number]  HPI  Very pleasant 80 yo female here with her niece for follow up.  HTN- deteriorated today but checks at home regularly.  Typically ranges 630Z-601U systolic. No CP, blurred vision, SOB or LE edema. Rushed to get out of house and just took her medication.  Tinnitus- chronic issue but would like to discuss this today.  Per niece, ENT told her it was due to her hearing loss and there was nothing they could do. She is wearing her hearing aid in left ear.  Abdominal pain- intermittent.  Has not felt it in weeks.  Mainly epigastric, feels like when her hiatal hernia was diagnosed. No nausea, vomiting, constipation or abdominal pain.  Patient Active Problem List   Diagnosis Date Noted  . Erythema of foot 08/21/2012  . Foot ulceration 02/09/2012  . Hypokalemia 12/12/2011  . Fibula fracture 12/12/2011  . Pyelonephritis 06/11/2011  . Weakness generalized 05/01/2011  . Gingivitis 11/30/2010  . BRADYCARDIA 05/22/2010  . WRIST PAIN, RIGHT 05/19/2010  . UNSPECIFIED PERIPHERAL VASCULAR DISEASE 12/28/2009  . WOUND, LEG 12/28/2009  . ALLERGIC RHINITIS 07/11/2009  . HYPERLIPIDEMIA 03/31/2009  . HYPERTENSION 03/31/2009  . PAROXYSMAL ATRIAL FIBRILLATION 03/31/2009  . HIATAL HERNIA 03/31/2009  . CONSTIPATION 03/31/2009  . FIBROMYALGIA 03/31/2009  . CEREBROVASCULAR ACCIDENT, HX OF 03/31/2009  . UTI'S, HX OF 03/31/2009   Past Medical History  Diagnosis Date  . Hyperlipidemia   . Hypertension   . Fibromyalgia   . Thyroid disease     hyperparathyroid  . PAD (peripheral artery disease)   . TIA (transient ischemic attack) ~ 2009    "only 1 that I know of" (12/13/2011)  . Atrial fibrillation     Not on Coumadin. Noted during 02/2009 admission.   . Skin cancer     Unknown types  . Schatzki's ring     Non obstructing ring on EGD 02/2009   . Pancreatic mass 02/2009    Abnormal CT abdomen,  with cystic area in head and circumferential thickening of midbody of stomach  . Fibula fracture 12/11/2011    left  . Stroke 2006    "drags right leg very slightly" (12/13/2011)  . GERD (gastroesophageal reflux disease)   . H/O hiatal hernia   . Hard of hearing     "totally deaf in the right; 50% is gone in the left" (12/13/2011)   Past Surgical History  Procedure Laterality Date  . Appendectomy  1940  . Parathyroidectomy  1980  . Vaginal hysterectomy  1970   History  Substance Use Topics  . Smoking status: Never Smoker   . Smokeless tobacco: Never Used  . Alcohol Use: No   Family History  Problem Relation Age of Onset  . Cancer Other     breast ca  . Cancer      mouth ca    No Known Allergies Current Outpatient Prescriptions on File Prior to Visit  Medication Sig Dispense Refill  . Ascorbic Acid (VITAMIN C) 500 MG tablet Take 500 mg by mouth daily.        Marland Kitchen aspirin 325 MG tablet Take 325 mg by mouth daily.        . calcium carbonate (OS-CAL) 600 MG TABS Take 600 mg by mouth daily.        . magnesium oxide (MAG-OX) 400 (241.3 MG) MG tablet take 1 tablet by mouth once daily  30 tablet  5  . Multiple Vitamins-Minerals (CENTRUM SILVER PO) Take 1 tablet by mouth daily.        . ranitidine (ZANTAC) 150 MG tablet take 1 tablet by mouth twice a day  60 tablet  1  . senna (SENOKOT) 8.6 MG tablet Take 1 tablet by mouth daily. , hold for loose bowels      . urea (CARMOL) 40 % CREA Apply bid to heel, disp 30 grams  30 each  4   No current facility-administered medications on file prior to visit.   The PMH, PSH, Social History, Family History, Medications, and allergies have been reviewed in [facility], and have been updated if relevant.   Review of Systems    See HPI Denies CP or SOB Good spirits No dysuria or increased urinary frequency Objective:   Physical Exam BP 172/74  Pulse 66  Temp(Src) 97.7 F (36.5 C) (Oral)  Wt 129 lb 8 oz (58.741 kg)  SpO2 97% Gen:  Elderly,  pleasant female, NAD HEENT:  TMs clear bilaterally Resp:  CTA bilaterally CVS:  RRR Abd:  Soft, NT, + BS Ext:  No edema    Assessment & Plan:

## 2013-04-16 NOTE — Assessment & Plan Note (Signed)
TMs clear bilaterally- no cerumen or fluid. Agree with ENT that likely will not improve. Did advise to follow up with them for further w/u if she would like but would most likely not be that helpful. The patient indicates understanding of these issues and agrees with the plan.

## 2013-04-16 NOTE — Assessment & Plan Note (Signed)
Deteriorated but asymptomatic, just took her medication and is reliably checking her BP at home. No changes today. She will continue to monitor BP.

## 2013-04-16 NOTE — Patient Instructions (Signed)
Good to see you. Keep an eye on your blood pressure.

## 2013-04-16 NOTE — Progress Notes (Signed)
Pre-visit discussion using our clinic review tool. No additional management support is needed unless otherwise documented below in the visit note.  

## 2013-04-16 NOTE — Assessment & Plan Note (Signed)
Asymptomatic. Will not check UA today.

## 2013-04-29 ENCOUNTER — Telehealth: Payer: Self-pay | Admitting: Family Medicine

## 2013-04-29 NOTE — Telephone Encounter (Signed)
Relevant patient education mailed to patient.  

## 2013-07-06 ENCOUNTER — Encounter: Payer: Self-pay | Admitting: Family Medicine

## 2013-07-06 ENCOUNTER — Ambulatory Visit (INDEPENDENT_AMBULATORY_CARE_PROVIDER_SITE_OTHER): Payer: Medicare Other | Admitting: Family Medicine

## 2013-07-06 VITALS — BP 150/74 | HR 69 | Temp 98.0°F | Ht 64.0 in | Wt 128.5 lb

## 2013-07-06 DIAGNOSIS — M67952 Unspecified disorder of synovium and tendon, left thigh: Secondary | ICD-10-CM

## 2013-07-06 DIAGNOSIS — M719 Bursopathy, unspecified: Secondary | ICD-10-CM

## 2013-07-06 DIAGNOSIS — M6790 Unspecified disorder of synovium and tendon, unspecified site: Secondary | ICD-10-CM

## 2013-07-06 DIAGNOSIS — M549 Dorsalgia, unspecified: Secondary | ICD-10-CM

## 2013-07-06 MED ORDER — TRAMADOL HCL 50 MG PO TABS: ORAL_TABLET | ORAL | Status: DC

## 2013-07-06 NOTE — Progress Notes (Signed)
Pre visit review using our clinic review tool, if applicable. No additional management support is needed unless otherwise documented below in the visit note. 

## 2013-07-06 NOTE — Progress Notes (Signed)
Date:  07/06/2013   Name:  Pamela [name]   DOB:  04/27/1916   MRN:  [id number]  Primary Physician:  Arnette Norris, MD   Chief Complaint: Sciatica and Hip Pain   Subjective:   History of Present Illness:  Pamela [name] is a 80 y.o. very pleasant female patient who presents with the following:  Lives alone, still driving some. The patient is quite hard of hearing, and history is difficult to obtain. Some of her history is obtained through her great niece.  Started some on Wednesday night. L sided.  With standing up and walking, it is a major problem. Has to anticipate and she is limping somewhat. Most of her pain is lateral, but she also has some posterior pain in the buttocks region. She also describes some pain that goes down sometimes into her leg but it never goes farther than her knee. She will take a Tylenol at nighttime before bed.  Started a month ago. Also has some bedroom shoes and has been limping with it for a while. Now it is hurting a lot more.    Past Medical History, Surgical History, Social History, Family History, Problem List, Medications, and Allergies have been reviewed and updated if relevant.  Review of Systems:  GEN: No fevers, chills. Nontoxic. Primarily MSK c/o today. MSK: Detailed in the HPI GI: tolerating PO intake without difficulty Neuro: No numbness, parasthesias, or tingling associated. Otherwise the pertinent positives of the ROS are noted above.   Objective:   Physical Examination: BP 150/74  Pulse 69  Temp(Src) 98 F (36.7 C) (Oral)  Ht 5\' 4"  (1.626 m)  Wt 128 lb 8 oz (58.287 kg)  BMI 22.05 kg/m2   GEN: WDWN, NAD, Non-toxic, Alert & Oriented x 3 HEENT: Atraumatic, Normocephalic.  Ears and Nose: No external deformity. EXTR: No clubbing/cyanosis/edema NEURO: antalgic gait.  PSYCH: Normally interactive. Conversant. Not depressed or anxious appearing.  Calm demeanor.   HIP EXAM: SIDE: L ROM: Abduction, Flexion, Internal and External  range of motion: full Pain with terminal IROM and EROM: no Patient does have some mild tenderness around and the gluteus medius as well as the posterior aspect of her LEFT posterior pelvic musculature. Minimally tender to nontender in the lumbar spine. GTB: NT SLR: NEG Knees: No effusion FABER: NT REVERSE FABER: NT, neg Piriformis: mild ttp at direct palpation Str: flexion: 4+/5 abduction: 4-/5 adduction: 4+/5 Strength testing mildly tender with abd     Radiology: No results found.  I attempted to look at multiple images to review her spine, but none of these were available for some reason through the software.  Assessment & Plan:   Tendinopathy of left gluteus medius  Back pain   Almost certainly with some degree of degenerative disc change in her back. She also is limping occasionally and has some calluses on her feet that are a little bit tender to palpation. A previously discussed management of these with her and her family.  I think a lot of this is likely secondary in nature, her hip moves remarkably well for a 80 year old. I tried to review some very basic hip rehabilitation with her. And I encouraged her to take more Tylenol and p.r.n. Tramadol for pain. At this point this is all that I would recommend.  Follow-up: No Follow-up on file.  New Prescriptions   TRAMADOL (ULTRAM) 50 MG TABLET    1/2 tabs - 1 tablet every 6 hours as needed for pain   No orders  of the defined types were placed in this encounter.   Patient Instructions  Tylenol 500 mg, 2 tablets 3 - 4 times a day  Hip Rehab:  Hip Flexion: Toe up to ceiling, laying on your back. Lift your whole leg, 3 sets. Work up to being able to do #30 with each set.  Hip Abductions: Lying on side, straight out to side. 3 sets, work up to being able to do #30 with each set.  At the beginning you may only be able to do a lot less, try to do #10.    Signed,  Maud Deed. Tahjir Silveria, MD, [place] at [facility] [place] [address] Phone: [phone] Fax: [phone]  Patient's Medications  New Prescriptions   TRAMADOL (ULTRAM) 50 MG TABLET    1/2 tabs - 1 tablet every 6 hours as needed for pain  Previous Medications   AMLODIPINE (NORVASC) 10 MG TABLET    take 1/2 tablet by mouth once daily   ASCORBIC ACID (VITAMIN C) 500 MG TABLET    Take 500 mg by mouth daily.     ASPIRIN 325 MG TABLET    Take 325 mg by mouth daily.     CALCIUM CARBONATE (OS-CAL) 600 MG TABS    Take 600 mg by mouth daily.     LOSARTAN (COZAAR) 100 MG TABLET    take 1 tablet by mouth daily   MAGNESIUM OXIDE (MAG-OX) 400 (241.3 MG) MG TABLET    take 1 tablet by mouth once daily   METOPROLOL TARTRATE (LOPRESSOR) 25 MG TABLET    take 1 tablet by mouth at bedtime   MULTIPLE VITAMINS-MINERALS (CENTRUM SILVER PO)    Take 1 tablet by mouth daily.     RANITIDINE (ZANTAC) 150 MG TABLET    take 1 tablet by mouth twice a day   SENNA (SENOKOT) 8.6 MG TABLET    Take 1 tablet by mouth daily. , hold for loose bowels   UREA (CARMOL) 40 % CREA    Apply bid to heel, disp 30 grams  Modified Medications   No medications on file  Discontinued Medications   No medications on file

## 2013-07-06 NOTE — Patient Instructions (Addendum)
Tylenol 500 mg, 2 tablets 3 - 4 times a day  Hip Rehab:  Hip Flexion: Toe up to ceiling, laying on your back. Lift your whole leg, 3 sets. Work up to being able to do #30 with each set.  Hip Abductions: Lying on side, straight out to side. 3 sets, work up to being able to do #30 with each set.  At the beginning you may only be able to do a lot less, try to do #10.

## 2013-07-13 ENCOUNTER — Other Ambulatory Visit: Payer: Self-pay | Admitting: Family Medicine

## 2013-11-20 ENCOUNTER — Other Ambulatory Visit: Payer: Self-pay | Admitting: Family Medicine

## 2014-01-01 ENCOUNTER — Encounter: Payer: Self-pay | Admitting: Surgery

## 2014-01-08 ENCOUNTER — Telehealth: Payer: Self-pay | Admitting: Family Medicine

## 2014-01-08 ENCOUNTER — Other Ambulatory Visit (INDEPENDENT_AMBULATORY_CARE_PROVIDER_SITE_OTHER): Payer: Medicare Other

## 2014-01-08 DIAGNOSIS — I10 Essential (primary) hypertension: Secondary | ICD-10-CM

## 2014-01-08 DIAGNOSIS — E785 Hyperlipidemia, unspecified: Secondary | ICD-10-CM

## 2014-01-08 LAB — COMPREHENSIVE METABOLIC PANEL
ALT: 16 U/L (ref 0–35)
AST: 22 U/L (ref 0–37)
Albumin: 3.8 g/dL (ref 3.5–5.2)
Alkaline Phosphatase: 62 U/L (ref 39–117)
BUN: 26 mg/dL — ABNORMAL HIGH (ref 6–23)
CO2: 24 meq/L (ref 19–32)
CREATININE: 0.9 mg/dL (ref 0.4–1.2)
Calcium: 9.4 mg/dL (ref 8.4–10.5)
Chloride: 103 mEq/L (ref 96–112)
GFR: 60.25 mL/min (ref >60.00)
Glucose, Bld: 98 mg/dL (ref 70–99)
Potassium: 4 mEq/L (ref 3.5–5.1)
Sodium: 142 mEq/L (ref 135–145)
Total Bilirubin: 0.6 mg/dL (ref 0.2–1.2)
Total Protein: 7.4 g/dL (ref 6.0–8.3)

## 2014-01-08 LAB — CBC WITH DIFFERENTIAL/PLATELET
BASOS ABS: 0 10*3/uL (ref 0.0–0.1)
BASOS PCT: 0.6 % (ref 0.0–3.0)
EOS ABS: 0.1 10*3/uL (ref 0.0–0.7)
Eosinophils Relative: 1.9 % (ref 0.0–5.0)
HEMATOCRIT: 43.9 % (ref 36.0–46.0)
HEMOGLOBIN: 14.2 g/dL (ref 12.0–15.0)
LYMPHS ABS: 1.6 10*3/uL (ref 0.7–4.0)
Lymphocytes Relative: 27.5 % (ref 12.0–46.0)
MCHC: 32.3 g/dL (ref 30.0–36.0)
MCV: 93.2 fl (ref 78.0–100.0)
MONOS PCT: 8.3 % (ref 3.0–12.0)
Monocytes Absolute: 0.5 10*3/uL (ref 0.1–1.0)
NEUTROS ABS: 3.7 10*3/uL (ref 1.4–7.7)
Neutrophils Relative %: 61.7 % (ref 43.0–77.0)
Platelets: 205 10*3/uL (ref 150.0–400.0)
RBC: 4.7 Mil/uL (ref 3.87–5.11)
RDW: 13.8 % (ref 11.5–15.5)
WBC: 5.9 10*3/uL (ref 4.0–10.5)

## 2014-01-08 LAB — TSH: TSH: 1.05 u[IU]/mL (ref 0.35–4.50)

## 2014-01-08 LAB — LIPID PANEL
Cholesterol: 258 mg/dL — ABNORMAL HIGH (ref 0–200)
HDL: 40.4 mg/dL (ref >39.00)
LDL Cholesterol: 180 mg/dL — ABNORMAL HIGH (ref 0–99)
NonHDL: 217.6
TRIGLYCERIDES: 187 mg/dL — AB (ref 0.0–149.0)
Total CHOL/HDL Ratio: 6
VLDL: 37.4 mg/dL (ref 0.0–40.0)

## 2014-01-08 NOTE — Telephone Encounter (Signed)
Message copied by Abner Greenspan on Fri Jan 08, 2014  7:40 AM ------      Message from: Ellamae Sia      Created: Fri Jan 01, 2014 11:55 AM      Regarding: Lab orders for Friday, 10.16.15       Patient is scheduled for CPX labs, please order future labs, Thanks , Terri       ------

## 2014-01-11 ENCOUNTER — Encounter: Payer: Self-pay | Admitting: Family Medicine

## 2014-01-11 ENCOUNTER — Ambulatory Visit (INDEPENDENT_AMBULATORY_CARE_PROVIDER_SITE_OTHER): Payer: Medicare Other | Admitting: Family Medicine

## 2014-01-11 VITALS — BP 128/64 | HR 70 | Temp 97.8°F | Ht 62.0 in | Wt 124.8 lb

## 2014-01-11 DIAGNOSIS — E785 Hyperlipidemia, unspecified: Secondary | ICD-10-CM

## 2014-01-11 DIAGNOSIS — I10 Essential (primary) hypertension: Secondary | ICD-10-CM

## 2014-01-11 DIAGNOSIS — Z23 Encounter for immunization: Secondary | ICD-10-CM

## 2014-01-11 DIAGNOSIS — Z0000 Encounter for general adult medical examination without abnormal findings: Secondary | ICD-10-CM | POA: Insufficient documentation

## 2014-01-11 NOTE — Patient Instructions (Signed)
Great to see you. Please restart taking you fish oil. Return in 2 months to recheck your cholesterol.

## 2014-01-11 NOTE — Progress Notes (Signed)
  Subjective:    Patient ID: Pamela [name], female    DOB: 12-Oct-1916, 80 y.o.   MRN: [id number]  HPI 80 year old female with history of PAF, HTN, hx of CVA here for medicare annual wellness visit.  I have personally reviewed the Medicare Annual Wellness questionnaire and have noted 1. The patient's medical and social history 2. Their use of alcohol, tobacco or illicit drugs 3. Their current medications and supplements 4. The patient's functional ability including ADL's, fall risks, home safety risks and hearing or visual             impairment. 5. Diet and physical activities 6. Evidence for depression or mood disorders   End of life wishes discussed and updated in Social History.  The roster of all physicians providing medical care to patient - is listed in the Snapshot section of the chart.  Remote h/o hysterectomy Mammogram 06/15/10 Pneumovax 03/26/06 Td 03/26/04 Zoster 02/09/10 Influenza 8/12/1    HTN- has been normotensive.  Denies CP, SOB or blurred vision.  BP Readings from Last 3 Encounters:  01/11/14 128/64  07/06/13 150/74  04/16/13 172/74    Lab Results  Component Value Date   CREATININE 0.9 01/08/2014   HLD- deteriorated.  Cannot tolerate statins- she cannot remember why.  Her niece thinks it was her liver function that increased.  Her sister "got cirrhosis of the liver from lipitor." Lab Results  Component Value Date   CHOL 258* 01/08/2014   HDL 40.40 01/08/2014   LDLCALC 180* 01/08/2014   LDLDIRECT 175.9 06/11/2011   TRIG 187.0* 01/08/2014   CHOLHDL 6 01/08/2014   Lab Results  Component Value Date   WBC 5.9 01/08/2014   HGB 14.2 01/08/2014   HCT 43.9 01/08/2014   MCV 93.2 01/08/2014   PLT 205.0 01/08/2014     Review of Systems      See HPI Appetite has decreased in last month or two Wt Readings from Last 3 Encounters:  01/11/14 124 lb 12 oz (56.586 kg)  07/06/13 128 lb 8 oz (58.287 kg)  04/16/13 129 lb 8 oz (58.741 kg)  Takes Sen[place] daily-  denies constipation currently No blood in your stool  Objective:   Physical Exam BP 128/64  Pulse 70  Temp(Src) 97.8 F (36.6 C) (Oral)  Ht 5\' 2"  (1.575 m)  Wt 124 lb 12 oz (56.586 kg)  BMI 22.81 kg/m2  SpO2 98%     General:  elderly female in NAD, appears younger than stated age Head:  Large hematoma over right maxillary bone with focal tenderness, per pt and her niece, smaller in size Eyes:  No corneal or conjunctival inflammation noted. EOMI. Perrla. Funduscopic exam benign, without hemorrhages, exudates or papilledema. Vision grossly normal. Mouth:  MMM Neck:  extensive brusing on neck and chin Lungs:  Normal respiratory effort, chest expands symmetrically. Lungs are clear to auscultation, no crackles or wheezes. Heart:  Normal rate and regular rhythm. S1 and S2 normal without gallop, murmur, click, rub or other extra sounds. Msk:  right wrist contusion radial aspect, FROM of wrist Psych:  Oriented X3, memory intact for recent and remote, normally interactive, good eye contact, not anxious appearing, not depressed appearing, and not agitated.        Assessment & Plan:

## 2014-01-11 NOTE — Assessment & Plan Note (Signed)
Deteriorated. Discussed how elevated her cholesterol is and risks of statins in someone her age, especially since we do not know what her previous adverse reaction was. Advised to restart fish oil. Follow up labs in 2 months.

## 2014-01-11 NOTE — Addendum Note (Signed)
Addended by: Modena Nunnery on: 01/11/2014 04:16 PM   Modules accepted: Orders

## 2014-01-11 NOTE — Assessment & Plan Note (Signed)
The patients weight, height, BMI and visual acuity have been recorded in the chart I have made referrals, counseling and provided education to the patient based review of the above and I have provided the pt with a written personalized care plan for preventive services.  Prevnar 13 and influenza vaccines given today.

## 2014-01-11 NOTE — Progress Notes (Signed)
Pre visit review using our clinic review tool, if applicable. No additional management support is needed unless otherwise documented below in the visit note. 

## 2014-03-03 ENCOUNTER — Other Ambulatory Visit (INDEPENDENT_AMBULATORY_CARE_PROVIDER_SITE_OTHER): Payer: Medicare Other

## 2014-03-03 DIAGNOSIS — E785 Hyperlipidemia, unspecified: Secondary | ICD-10-CM

## 2014-03-03 LAB — LIPID PANEL
Cholesterol: 272 mg/dL — ABNORMAL HIGH (ref 0–200)
HDL: 52.9 mg/dL (ref >39.00)
NonHDL: 219.1
Total CHOL/HDL Ratio: 5
Triglycerides: 206 mg/dL — ABNORMAL HIGH (ref 0.0–149.0)
VLDL: 41.2 mg/dL — ABNORMAL HIGH (ref 0.0–40.0)

## 2014-03-03 LAB — LDL CHOLESTEROL, DIRECT: LDL DIRECT: 181.8 mg/dL

## 2014-03-04 ENCOUNTER — Other Ambulatory Visit: Payer: Self-pay | Admitting: Family Medicine

## 2014-03-04 MED ORDER — EZETIMIBE 10 MG PO TABS: 10.0000 mg | ORAL_TABLET | Freq: Every day | ORAL | Status: DC

## 2014-03-05 ENCOUNTER — Telehealth: Payer: Self-pay

## 2014-03-05 ENCOUNTER — Encounter (HOSPITAL_COMMUNITY): Payer: Self-pay | Admitting: *Deleted

## 2014-03-05 ENCOUNTER — Telehealth: Payer: Self-pay | Admitting: Family Medicine

## 2014-03-05 ENCOUNTER — Emergency Department (HOSPITAL_COMMUNITY)
Admission: EM | Admit: 2014-03-05 | Discharge: 2014-03-06 | Disposition: A | Discharge status: Home / Self Care | Payer: Medicare Other | Source: Home / Self Care | Attending: Emergency Medicine | Admitting: Emergency Medicine

## 2014-03-05 ENCOUNTER — Emergency Department (HOSPITAL_COMMUNITY): Payer: Medicare Other

## 2014-03-05 DIAGNOSIS — Z90710 Acquired absence of both cervix and uterus: Secondary | ICD-10-CM

## 2014-03-05 DIAGNOSIS — H9190 Unspecified hearing loss, unspecified ear: Secondary | ICD-10-CM | POA: Diagnosis present

## 2014-03-05 DIAGNOSIS — R5383 Other fatigue: Secondary | ICD-10-CM | POA: Insufficient documentation

## 2014-03-05 DIAGNOSIS — I6340 Cerebral infarction due to embolism of unspecified cerebral artery: Secondary | ICD-10-CM | POA: Diagnosis not present

## 2014-03-05 DIAGNOSIS — E213 Hyperparathyroidism, unspecified: Secondary | ICD-10-CM | POA: Diagnosis present

## 2014-03-05 DIAGNOSIS — Z888 Allergy status to other drugs, medicaments and biological substances status: Secondary | ICD-10-CM

## 2014-03-05 DIAGNOSIS — E892 Postprocedural hypoparathyroidism: Secondary | ICD-10-CM | POA: Diagnosis present

## 2014-03-05 DIAGNOSIS — R4182 Altered mental status, unspecified: Secondary | ICD-10-CM | POA: Insufficient documentation

## 2014-03-05 DIAGNOSIS — Q394 Esophageal web: Secondary | ICD-10-CM

## 2014-03-05 DIAGNOSIS — K219 Gastro-esophageal reflux disease without esophagitis: Secondary | ICD-10-CM | POA: Diagnosis present

## 2014-03-05 DIAGNOSIS — E785 Hyperlipidemia, unspecified: Secondary | ICD-10-CM | POA: Insufficient documentation

## 2014-03-05 DIAGNOSIS — Z8673 Personal history of transient ischemic attack (TIA), and cerebral infarction without residual deficits: Secondary | ICD-10-CM | POA: Insufficient documentation

## 2014-03-05 DIAGNOSIS — Z8781 Personal history of (healed) traumatic fracture: Secondary | ICD-10-CM | POA: Insufficient documentation

## 2014-03-05 DIAGNOSIS — C4490 Unspecified malignant neoplasm of skin, unspecified: Secondary | ICD-10-CM | POA: Diagnosis present

## 2014-03-05 DIAGNOSIS — Z79899 Other long term (current) drug therapy: Secondary | ICD-10-CM

## 2014-03-05 DIAGNOSIS — Z8719 Personal history of other diseases of the digestive system: Secondary | ICD-10-CM | POA: Insufficient documentation

## 2014-03-05 DIAGNOSIS — Z7982 Long term (current) use of aspirin: Secondary | ICD-10-CM

## 2014-03-05 DIAGNOSIS — I739 Peripheral vascular disease, unspecified: Secondary | ICD-10-CM | POA: Diagnosis present

## 2014-03-05 DIAGNOSIS — I6529 Occlusion and stenosis of unspecified carotid artery: Secondary | ICD-10-CM | POA: Diagnosis present

## 2014-03-05 DIAGNOSIS — G8194 Hemiplegia, unspecified affecting left nondominant side: Secondary | ICD-10-CM | POA: Diagnosis present

## 2014-03-05 DIAGNOSIS — R531 Weakness: Secondary | ICD-10-CM

## 2014-03-05 DIAGNOSIS — Z8739 Personal history of other diseases of the musculoskeletal system and connective tissue: Secondary | ICD-10-CM

## 2014-03-05 DIAGNOSIS — K222 Esophageal obstruction: Secondary | ICD-10-CM | POA: Diagnosis present

## 2014-03-05 DIAGNOSIS — I10 Essential (primary) hypertension: Secondary | ICD-10-CM | POA: Diagnosis present

## 2014-03-05 DIAGNOSIS — I482 Chronic atrial fibrillation: Secondary | ICD-10-CM | POA: Diagnosis present

## 2014-03-05 DIAGNOSIS — K869 Disease of pancreas, unspecified: Secondary | ICD-10-CM | POA: Diagnosis present

## 2014-03-05 DIAGNOSIS — M797 Fibromyalgia: Secondary | ICD-10-CM | POA: Diagnosis present

## 2014-03-05 DIAGNOSIS — Z85828 Personal history of other malignant neoplasm of skin: Secondary | ICD-10-CM

## 2014-03-05 LAB — CBC
HCT: 39.8 % (ref 36.0–46.0)
Hemoglobin: 13.1 g/dL (ref 12.0–15.0)
MCH: 30.9 pg (ref 26.0–34.0)
MCHC: 32.9 g/dL (ref 30.0–36.0)
MCV: 93.9 fL (ref 78.0–100.0)
Platelets: 206 10*3/uL (ref 150–400)
RBC: 4.24 MIL/uL (ref 3.87–5.11)
RDW: 13.7 % (ref 11.5–15.5)
WBC: 6.1 10*3/uL (ref 4.0–10.5)

## 2014-03-05 LAB — COMPREHENSIVE METABOLIC PANEL
ALBUMIN: 4.1 g/dL (ref 3.5–5.2)
ALK PHOS: 79 U/L (ref 39–117)
ALT: 18 U/L (ref 0–35)
AST: 29 U/L (ref 0–37)
Anion gap: 13 (ref 5–15)
BILIRUBIN TOTAL: 0.4 mg/dL (ref 0.3–1.2)
BUN: 29 mg/dL — ABNORMAL HIGH (ref 6–23)
CHLORIDE: 101 meq/L (ref 96–112)
CO2: 27 mEq/L (ref 19–32)
Calcium: 10 mg/dL (ref 8.4–10.5)
Creatinine, Ser: 0.79 mg/dL (ref 0.50–1.10)
GFR calc Af Amer: 79 mL/min — ABNORMAL LOW (ref >90)
GFR calc non Af Amer: 69 mL/min — ABNORMAL LOW (ref >90)
Glucose, Bld: 101 mg/dL — ABNORMAL HIGH (ref 70–99)
POTASSIUM: 4.2 meq/L (ref 3.7–5.3)
SODIUM: 141 meq/L (ref 137–147)
Total Protein: 7.1 g/dL (ref 6.0–8.3)

## 2014-03-05 LAB — URINALYSIS, ROUTINE W REFLEX MICROSCOPIC
BILIRUBIN URINE: NEGATIVE
Glucose, UA: NEGATIVE mg/dL
Hgb urine dipstick: NEGATIVE
Ketones, ur: 15 mg/dL — AB
Nitrite: NEGATIVE
PH: 6 (ref 5.0–8.0)
Protein, ur: NEGATIVE mg/dL
SPECIFIC GRAVITY, URINE: 1.018 (ref 1.005–1.030)
Urobilinogen, UA: 0.2 mg/dL (ref 0.0–1.0)

## 2014-03-05 LAB — URINE MICROSCOPIC-ADD ON

## 2014-03-05 NOTE — ED Provider Notes (Signed)
CSN: [id number]     Arrival date & time 03/05/14  1843 History   First MD Initiated Contact with Patient 03/05/14 1950     Chief Complaint  Patient presents with  . Altered Mental Status     (Consider location/radiation/quality/duration/timing/severity/associated sxs/prior Treatment) HPI 80 year old female with a history of remote CVA, hypertension, , atrial fibrillation, and recurrent urinary tract infections who presents with several days of confusion. Her daughter says that over the past 2 days the patient has had intermittent periods of seeming confused, and having difficulty getting around. She will have other times when she appears to be completely normal. She also reports that today the patient seemed more weak than usual. She says that she has been not drinking as much recently because she has recently found a difficult to stand up from the toilet, due to decreasing strength in her legs. She has not had any facial droop, no slurred speech, no frank ataxia, no weakness in any of her extremities, no fever or other complaints.   Past Medical History  Diagnosis Date  . Hyperlipidemia   . Hypertension   . Fibromyalgia   . Thyroid disease     hyperparathyroid  . PAD (peripheral artery disease)   . TIA (transient ischemic attack) ~ 2009    "only 1 that I know of" (12/13/2011)  . Atrial fibrillation     Not on Coumadin. Noted during 02/2009 admission.   . Skin cancer     Unknown types  . Schatzki's ring     Non obstructing ring on EGD 02/2009   . Pancreatic mass 02/2009    Abnormal CT abdomen, with cystic area in head and circumferential thickening of midbody of stomach  . Fibula fracture 12/11/2011    left  . Stroke 2006    "drags right leg very slightly" (12/13/2011)  . GERD (gastroesophageal reflux disease)   . H/O hiatal hernia   . Hard of hearing     "totally deaf in the right; 50% is gone in the left" (12/13/2011)   Past Surgical History  Procedure Laterality Date  .  Appendectomy  1940  . Parathyroidectomy  1980  . Vaginal hysterectomy  1970   Family History  Problem Relation Age of Onset  . Cancer Other     breast ca  . Cancer      mouth ca    History  Substance Use Topics  . Smoking status: Never Smoker   . Smokeless tobacco: Never Used  . Alcohol Use: No   OB History    No data available     Review of Systems  Constitutional: Positive for fatigue. Negative for fever.  Neurological: Positive for weakness. Negative for dizziness, tremors, syncope, facial asymmetry, speech difficulty, light-headedness, numbness and headaches.  All other systems reviewed and are negative.     Allergies  Statins  Home Medications   Prior to Admission medications   Medication Sig Start Date End Date Taking? Authorizing Provider  amLODipine (NORVASC) 10 MG tablet Take 5 mg by mouth daily.   Yes Historical Provider, MD  Ascorbic Acid (VITAMIN C) 500 MG tablet Take 500 mg by mouth daily.     Yes Historical Provider, MD  aspirin 325 MG tablet Take 325 mg by mouth daily.     Yes Historical Provider, MD  calcium carbonate (OS-CAL) 600 MG TABS Take 600 mg by mouth daily.     Yes Historical Provider, MD  ezetimibe (ZETIA) 10 MG tablet Take 1 tablet (10 mg  total) by mouth daily. 03/04/14  Yes Lucille Passy, MD  losartan (COZAAR) 100 MG tablet take 1 tablet by mouth daily Patient taking differently: Take 100 mg by mouth daily. take 1 tablet by mouth daily 04/16/13  Yes Lucille Passy, MD  magnesium oxide (MAG-OX) 400 (241.3 MG) MG tablet take 1 tablet by mouth once daily 03/02/13  Yes Lucille Passy, MD  metoprolol tartrate (LOPRESSOR) 25 MG tablet take 1 tablet by mouth at bedtime 04/16/13  Yes Lucille Passy, MD  Multiple Vitamins-Minerals (CENTRUM SILVER PO) Take 1 tablet by mouth daily.     Yes Historical Provider, MD  ranitidine (ZANTAC) 150 MG tablet take 1 tablet by mouth twice a day 11/20/13  Yes Lucille Passy, MD  senna (SENOKOT) 8.6 MG tablet Take 1 tablet by  mouth daily. , hold for loose bowels   Yes Historical Provider, MD  amLODipine (NORVASC) 10 MG tablet take 1/2 tablet by mouth once daily Patient not taking: Reported on 03/05/2014 04/16/13   Lucille Passy, MD  urea (CARMOL) 40 % CREA Apply bid to heel, disp 30 grams 02/02/13   Spencer Copland, MD   BP 129/51 mmHg  Pulse 65  Temp(Src) 98 F (36.7 C) (Oral)  Resp 18  Ht 5\' 3"  (1.6 m)  Wt 124 lb (56.246 kg)  BMI 21.97 kg/m2  SpO2 94% Physical Exam  Constitutional: She appears well-developed.  Elderly female, frail  HENT:  Head: Normocephalic and atraumatic.  Hard of hearing  Eyes: EOM are normal. Pupils are equal, round, and reactive to light.  Nursing note and vitals reviewed.   ED Course  Procedures (including critical care time) Labs Review Labs Reviewed  COMPREHENSIVE METABOLIC PANEL - Abnormal; Notable for the following:    Glucose, Bld 101 (*)    BUN 29 (*)    GFR calc non Af Amer 69 (*)    GFR calc Af Amer 79 (*)    All other components within normal limits  URINALYSIS, ROUTINE W REFLEX MICROSCOPIC - Abnormal; Notable for the following:    APPearance CLOUDY (*)    Ketones, ur 15 (*)    Leukocytes, UA MODERATE (*)    All other components within normal limits  URINE MICROSCOPIC-ADD ON - Abnormal; Notable for the following:    Bacteria, UA FEW (*)    All other components within normal limits  CBC    Imaging Review Ct Head Wo Contrast  03/05/2014   CLINICAL DATA:  Altered mental status 2 days ago, return to baseline, new episode of confusion today, generalized weakness and worsening incontinence.  EXAM: CT HEAD WITHOUT CONTRAST  TECHNIQUE: Contiguous axial images were obtained from the base of the skull through the vertex without intravenous contrast.  COMPARISON:  CT of the head May 05, 2012  FINDINGS: The ventricles and sulci are normal for age. No intraparenchymal hemorrhage, mass effect nor midline shift. Cough 1 supratentorial white matter hypodensities. No  acute large vascular territory infarcts. Chronic LEFT thalamus lacunar infarct.  No abnormal extra-axial fluid collections. Basal cisterns are patent. Moderate calcific atherosclerosis of the carotid siphons.  No skull fracture. The included ocular globes and orbital contents are non-suspicious. The mastoid aircells and included paranasal sinuses are well-aerated.  IMPRESSION: No acute intracranial process.  Involutional changes. Moderate to severe white matter changes suggest chronic small vessel ischemic disease. Remote LEFT thalamus lacunar infarct.   Electronically Signed   By: Elon Alas   On: 03/05/2014 22:30   Dg  Chest Port 1 View  03/05/2014   CLINICAL DATA:  Altered mental status and weakness.  EXAM: PORTABLE CHEST - 1 VIEW  COMPARISON:  12/14/2011  FINDINGS: Shallow inspiration. Patient rotated. The heart size and mediastinal contours are within normal limits. Both lungs are clear. The visualized skeletal structures are unremarkable.  IMPRESSION: No active disease.   Electronically Signed   By: Lucienne Capers M.D.   On: 03/05/2014 21:09     EKG Interpretation   Date/Time:  Friday March 05 2014 19:01:42 EST Ventricular Rate:  74 PR Interval:  182 QRS Duration: 70 QT Interval:  400 QTC Calculation: 444 R Axis:   52 Text Interpretation:  Sinus rhythm with marked sinus arrhythmia with  occasional Premature ventricular complexes Nonspecific ST and T wave  abnormality Abnormal ECG Confirmed by BEATON  MD, ROBERT ([id number]) on  03/05/2014 11:12:11 PM      MDM   Final diagnoses:  Altered mental status   80 year old female presenting with weakness and intermittent confusion. She does have a history of urinary tract infections, but her UA is not convincingly diagnostic for urinary tract infection, but will send for culture, and have her follow closely with her primary care doctor. She has no evidence of electrolyte disturbances, no leukocytosis to suggest occult infection or  inflammation, no fever and normal vital signs. She has no lateralizing neurologic deficits on exam, no gross motor dysfunction or loss of coordination. CT of the head shows no acute changes, and is unchanged from previous. EKG with nonspecific ST and T-wave changes, atrial fibrillation, unchanged previous. Patient does have dry mucous membranes and appears somewhat dehydrated, considering her clinical history of decreased by mouth intake, this may be contributing factor.   No acute cause of the patient's vague symptoms identified, doubt stroke, doubt infectious etiology, may be due to dehydration.  I believe that admitting the patient at this point without any particular diagnosis would put her at a greater risk for delirium, nosocomial infection. She has good follow-up, and her daughter stays with her at home, they will follow up on Monday with her PCP, and return for any worsening of her condition.  Leata Mouse, MD 03/06/14 [id number]  Dot Lanes, MD 03/06/14 [phone]

## 2014-03-05 NOTE — Telephone Encounter (Signed)
PLEASE NOTE: All timestamps contained within this report are represented as [place] Standard Time. CONFIDENTIALTY NOTICE: This fax transmission is intended only for the addressee. It contains information that is legally privileged, confidential or otherwise protected from use or disclosure. If you are not the intended recipient, you are strictly prohibited from reviewing, disclosing, copying using or disseminating any of this information or taking any action in reliance on or regarding this information. If you have received this fax in error, please notify us immediately by telephone so that we can arrange for its return to [place]. Phone: [phone], Toll-Free: [phone], Fax: [phone] Page: 1 of 1 Call Id: [id number] [place] Patient Name: Pamela [name] Gender: Female DOB: 10/02/1916 Age: 80 Y 5 M 23 D Return Phone Number: [phone] (Primary) Address: City/State/Zip: [place] Client [place] [facility] - Client Client Site [place] - Day Contact Type Call Call Type Triage / Clinical Caller Name Edwin Dada Relationship To Patient Other relative Return Phone Number [phone] (Primary) Chief Complaint CONFUSION - new onset Initial Comment Caller states she wants to see if pt can come in for urine test. Confused, could not hold urine this morning. Nurse Assessment Nurse: Wynetta Emery, RN, Baker Janus Date/Time Eilene Ghazi Time): 03/05/2014 10:24:13 AM Confirm and document reason for call. If symptomatic, describe symptoms. ---Issa is more confused possibly urinating more than usual has of UTI's Has the patient traveled out of the country within the last 30 days? ---No Does the patient require triage? ---No Please document clinical information provided and list any resource used. ---not with Angalena but needs to have her see MD this  afternoon. Guidelines Guideline Title Affirmed Question Affirmed Notes Nurse Date/Time (Eastern Time) Disp. Time Eilene Ghazi Time) Disposition Final User 03/05/2014 10:23:05 AM Send to Urgent Queue Caryl Comes 03/05/2014 10:38:16 AM Clinical Call Yes Wynetta Emery, RN, Baker Janus After Care Instructions Given Call Event Type User Date / Time Description Comments User: Michele Rockers, RN Date/Time Eilene Ghazi Time): 03/05/2014 10:38:02 AM Office can not get her in but can offer her a n appt at [facility], Levada Dy advised she will just take her to [facility] instead of traveling so far for the appt.

## 2014-03-05 NOTE — Discharge Instructions (Signed)
Confusion Confusion is the inability to think with your usual speed or clarity. Confusion may come on quickly or slowly over time. How quickly the confusion comes on depends on the cause. Confusion can be due to any number of causes. CAUSES   Concussion, head injury, or head trauma.  Seizures.  Stroke.  Fever.  Brain tumor.  Age related decreased brain function (dementia).  Heightened emotional states like rage or terror.  Mental illness in which the person loses the ability to determine what is real and what is not (hallucinations).  Infections such as a urinary tract infection (UTI).  Toxic effects from alcohol, drugs, or prescription medicines.  Dehydration and an imbalance of salts in the body (electrolytes).  Lack of sleep.  Low blood sugar (diabetes).  Low levels of oxygen from conditions such as chronic lung disorders.  Drug interactions or other medicine side effects.  Nutritional deficiencies, especially niacin, thiamine, vitamin C, or vitamin B.  Sudden drop in body temperature (hypothermia).  Change in routine, such as when traveling or hospitalized. SIGNS AND SYMPTOMS  People often describe their thinking as cloudy or unclear when they are confused. Confusion can also include feeling disoriented. That means you are unaware of where or who you are. You may also not know what the date or time is. If confused, you may also have difficulty paying attention, remembering, and making decisions. Some people also act aggressively when they are confused.  DIAGNOSIS  The medical evaluation of confusion may include:  Blood and urine tests.  X-rays.  Brain and nervous system tests.  Analyzing your brain waves (electroencephalogram or EEG).  Magnetic resonance imaging (MRI) of your head.  Computed tomography (CT) scan of your head.  Mental status tests in which your health care provider may ask many questions. Some of these questions may seem silly or strange,  but they are a very important test to help diagnose and treat confusion. TREATMENT  An admission to the hospital may not be needed, but a person with confusion should not be left alone. Stay with a family member or friend until the confusion clears. Avoid alcohol, pain relievers, or sedative drugs until you have fully recovered. Do not drive until directed by your health care provider. HOME CARE INSTRUCTIONS  What family and friends can do:  To find out if someone is confused, ask the person to state his or her name, age, and the date. If the person is unsure or answers incorrectly, he or she is confused.  Always introduce yourself, no matter how well the person knows you.  Often remind the person of his or her location.  Place a calendar and clock near the confused person.  Help the person with his or her medicines. You may want to use a pill box, an alarm as a reminder, or give the person each dose as prescribed.  Talk about current events and plans for the day.  Try to keep the environment calm, quiet, and peaceful.  Make sure the person keeps follow-up visits with his or her health care provider. PREVENTION  Ways to prevent confusion:  Avoid alcohol.  Eat a balanced diet.  Get enough sleep.  Take medicine only as directed by your health care provider.  Do not become isolated. Spend time with other people and make plans for your days.  Keep careful watch on your blood sugar levels if you are diabetic. SEEK IMMEDIATE MEDICAL CARE IF:   You develop severe headaches, repeated vomiting, seizures, blackouts, or   slurred speech.  There is increasing confusion, weakness, numbness, restlessness, or personality changes.  You develop a loss of balance, have marked dizziness, feel uncoordinated, or fall.  You have delusions, hallucinations, or develop severe anxiety.  Your family members think you need to be rechecked. Document Released: 04/19/2004 Document Revised: 07/27/2013  Document Reviewed: 04/17/2013 Exit[employer] Patient Information 2015 [employer]. This information is not intended to replace advice given to you by your health care provider. Make sure you discuss any questions you have with your health care provider.  

## 2014-03-05 NOTE — Telephone Encounter (Signed)
Follow up CMET and lipid panel in 4 weeks (lab visit only).

## 2014-03-05 NOTE — ED Notes (Addendum)
Family reports pts baseline is that she lives at home alone, still walks and drives herself around. Having increase in confusion over the past few days, more severe today and having weakness, difficulty ambulating. Reports foul smelling urine, hx of UTI, also has hx of cva. Pt told family she hasnt been drinking a lot of fluids lately due to frequent urination and now has dark urine.

## 2014-03-05 NOTE — Telephone Encounter (Signed)
Marcie Bal pts niece request cb with when pt should schedule f/u appt or lab appt since starting Zetia.Please advise.

## 2014-03-05 NOTE — ED Notes (Signed)
Pts daughter states pt was acting different Wednesday morning, but later patient returned to baseline. Pt states this am she felt confused. Reporting generalized weakness and some incontinence that has been worse the past few days.  Physician at walk in [facility] suggested pt come here, daughter states they were told urine was dark and had a foul odor at clinic. Pt alert, oriented, nad.

## 2014-03-05 NOTE — ED Notes (Signed)
Lab notified of add on urine culture

## 2014-03-05 NOTE — Telephone Encounter (Signed)
Lm on pts niece's vm advising per Dr Deborra Medina. Advised to contact office should she have any additional questions.

## 2014-03-05 NOTE — ED Notes (Signed)
MD at bedside. 

## 2014-03-06 ENCOUNTER — Emergency Department (HOSPITAL_COMMUNITY): Payer: Medicare Other

## 2014-03-06 ENCOUNTER — Inpatient Hospital Stay (HOSPITAL_COMMUNITY): Payer: Medicare Other

## 2014-03-06 ENCOUNTER — Inpatient Hospital Stay (HOSPITAL_COMMUNITY)
Admission: EM | Admit: 2014-03-06 | Discharge: 2014-03-09 | DRG: 065 | Disposition: A | Discharge status: Skilled Nursing Facility | Payer: Medicare Other | Attending: Internal Medicine | Admitting: Internal Medicine

## 2014-03-06 ENCOUNTER — Encounter (HOSPITAL_COMMUNITY): Payer: Self-pay | Admitting: Emergency Medicine

## 2014-03-06 DIAGNOSIS — E213 Hyperparathyroidism, unspecified: Secondary | ICD-10-CM | POA: Diagnosis present

## 2014-03-06 DIAGNOSIS — E892 Postprocedural hypoparathyroidism: Secondary | ICD-10-CM | POA: Diagnosis present

## 2014-03-06 DIAGNOSIS — I6340 Cerebral infarction due to embolism of unspecified cerebral artery: Secondary | ICD-10-CM | POA: Diagnosis present

## 2014-03-06 DIAGNOSIS — C4490 Unspecified malignant neoplasm of skin, unspecified: Secondary | ICD-10-CM | POA: Diagnosis present

## 2014-03-06 DIAGNOSIS — I739 Peripheral vascular disease, unspecified: Secondary | ICD-10-CM | POA: Diagnosis present

## 2014-03-06 DIAGNOSIS — Z7982 Long term (current) use of aspirin: Secondary | ICD-10-CM | POA: Diagnosis not present

## 2014-03-06 DIAGNOSIS — Z90710 Acquired absence of both cervix and uterus: Secondary | ICD-10-CM | POA: Diagnosis not present

## 2014-03-06 DIAGNOSIS — R531 Weakness: Secondary | ICD-10-CM | POA: Insufficient documentation

## 2014-03-06 DIAGNOSIS — I639 Cerebral infarction, unspecified: Secondary | ICD-10-CM | POA: Diagnosis present

## 2014-03-06 DIAGNOSIS — I6529 Occlusion and stenosis of unspecified carotid artery: Secondary | ICD-10-CM | POA: Diagnosis present

## 2014-03-06 DIAGNOSIS — H9190 Unspecified hearing loss, unspecified ear: Secondary | ICD-10-CM | POA: Diagnosis present

## 2014-03-06 DIAGNOSIS — K219 Gastro-esophageal reflux disease without esophagitis: Secondary | ICD-10-CM | POA: Diagnosis present

## 2014-03-06 DIAGNOSIS — Z8673 Personal history of transient ischemic attack (TIA), and cerebral infarction without residual deficits: Secondary | ICD-10-CM | POA: Diagnosis not present

## 2014-03-06 DIAGNOSIS — I482 Chronic atrial fibrillation, unspecified: Secondary | ICD-10-CM | POA: Insufficient documentation

## 2014-03-06 DIAGNOSIS — G8194 Hemiplegia, unspecified affecting left nondominant side: Secondary | ICD-10-CM | POA: Diagnosis present

## 2014-03-06 DIAGNOSIS — I10 Essential (primary) hypertension: Secondary | ICD-10-CM | POA: Diagnosis present

## 2014-03-06 DIAGNOSIS — Z888 Allergy status to other drugs, medicaments and biological substances status: Secondary | ICD-10-CM | POA: Diagnosis not present

## 2014-03-06 DIAGNOSIS — E785 Hyperlipidemia, unspecified: Secondary | ICD-10-CM | POA: Diagnosis present

## 2014-03-06 DIAGNOSIS — Z79899 Other long term (current) drug therapy: Secondary | ICD-10-CM | POA: Diagnosis not present

## 2014-03-06 DIAGNOSIS — K222 Esophageal obstruction: Secondary | ICD-10-CM | POA: Diagnosis present

## 2014-03-06 DIAGNOSIS — M797 Fibromyalgia: Secondary | ICD-10-CM | POA: Diagnosis present

## 2014-03-06 DIAGNOSIS — K869 Disease of pancreas, unspecified: Secondary | ICD-10-CM | POA: Diagnosis present

## 2014-03-06 LAB — CBC WITH DIFFERENTIAL/PLATELET
Basophils Absolute: 0 10*3/uL (ref 0.0–0.1)
Basophils Relative: 0 % (ref 0–1)
Eosinophils Absolute: 0 10*3/uL (ref 0.0–0.7)
Eosinophils Relative: 1 % (ref 0–5)
HEMATOCRIT: 41.9 % (ref 36.0–46.0)
Hemoglobin: 13.6 g/dL (ref 12.0–15.0)
LYMPHS ABS: 0.9 10*3/uL (ref 0.7–4.0)
LYMPHS PCT: 16 % (ref 12–46)
MCH: 31.3 pg (ref 26.0–34.0)
MCHC: 32.5 g/dL (ref 30.0–36.0)
MCV: 96.5 fL (ref 78.0–100.0)
MONO ABS: 0.5 10*3/uL (ref 0.1–1.0)
MONOS PCT: 8 % (ref 3–12)
Neutro Abs: 4.4 10*3/uL (ref 1.7–7.7)
Neutrophils Relative %: 75 % (ref 43–77)
PLATELETS: 188 10*3/uL (ref 150–400)
RBC: 4.34 MIL/uL (ref 3.87–5.11)
RDW: 13.4 % (ref 11.5–15.5)
WBC: 5.8 10*3/uL (ref 4.0–10.5)

## 2014-03-06 LAB — URINALYSIS, ROUTINE W REFLEX MICROSCOPIC
BILIRUBIN URINE: NEGATIVE
Glucose, UA: NEGATIVE mg/dL
Hgb urine dipstick: NEGATIVE
KETONES UR: 15 mg/dL — AB
Leukocytes, UA: NEGATIVE
NITRITE: NEGATIVE
Protein, ur: NEGATIVE mg/dL
Specific Gravity, Urine: 1.005 (ref 1.005–1.030)
UROBILINOGEN UA: 0.2 mg/dL (ref 0.0–1.0)
pH: 6.5 (ref 5.0–8.0)

## 2014-03-06 LAB — COMPREHENSIVE METABOLIC PANEL
ALT: 19 U/L (ref 0–35)
ANION GAP: 12 (ref 5–15)
AST: 32 U/L (ref 0–37)
Albumin: 3.9 g/dL (ref 3.5–5.2)
Alkaline Phosphatase: 80 U/L (ref 39–117)
BUN: 21 mg/dL (ref 6–23)
CO2: 28 meq/L (ref 19–32)
CREATININE: 0.72 mg/dL (ref 0.50–1.10)
Calcium: 9.6 mg/dL (ref 8.4–10.5)
Chloride: 99 mEq/L (ref 96–112)
GFR calc Af Amer: 82 mL/min — ABNORMAL LOW (ref >90)
GFR, EST NON AFRICAN AMERICAN: 71 mL/min — AB (ref >90)
Glucose, Bld: 93 mg/dL (ref 70–99)
Potassium: 4.5 mEq/L (ref 3.7–5.3)
SODIUM: 139 meq/L (ref 137–147)
TOTAL PROTEIN: 6.9 g/dL (ref 6.0–8.3)
Total Bilirubin: 0.5 mg/dL (ref 0.3–1.2)

## 2014-03-06 LAB — PROTIME-INR
INR: 1.02 (ref 0.00–1.49)
PROTHROMBIN TIME: 13.5 s (ref 11.6–15.2)

## 2014-03-06 LAB — ETHANOL

## 2014-03-06 LAB — LACTIC ACID, PLASMA: LACTIC ACID, VENOUS: 1 mmol/L (ref 0.5–2.2)

## 2014-03-06 MED ORDER — STROKE: EARLY STAGES OF RECOVERY BOOK
Freq: Once | Status: AC
  Administered 2014-03-06: 1
  Filled 2014-03-06: qty 1

## 2014-03-06 MED ORDER — SODIUM CHLORIDE 0.9 % IV BOLUS (SEPSIS)
500.0000 mL | Freq: Once | INTRAVENOUS | Status: AC
  Administered 2014-03-06: 500 mL via INTRAVENOUS

## 2014-03-06 MED ORDER — SODIUM CHLORIDE 0.9 % IV SOLN: 1000.0000 mL | INTRAVENOUS | Status: DC

## 2014-03-06 MED ORDER — ASPIRIN 325 MG PO TABS: 325.0000 mg | ORAL_TABLET | Freq: Every day | ORAL | Status: DC

## 2014-03-06 MED ORDER — SODIUM CHLORIDE 0.9 % IV SOLN
1000.0000 mL | Freq: Once | INTRAVENOUS | Status: AC
Start: 2014-03-06 — End: 2014-03-06
  Administered 2014-03-06: 1000 mL via INTRAVENOUS

## 2014-03-06 MED ORDER — SODIUM CHLORIDE 0.9 % IV SOLN
INTRAVENOUS | Status: AC
  Administered 2014-03-06: 19:00:00 via INTRAVENOUS

## 2014-03-06 MED ORDER — CLOPIDOGREL BISULFATE 75 MG PO TABS
75.0000 mg | ORAL_TABLET | Freq: Every day | ORAL | Status: DC
  Administered 2014-03-06 – 2014-03-08 (×3): 75 mg via ORAL
  Filled 2014-03-06 (×3): qty 1

## 2014-03-06 MED ORDER — CALCIUM CARBONATE 1250 (500 CA) MG PO TABS
1000.0000 mg | ORAL_TABLET | Freq: Every day | ORAL | Status: DC
  Administered 2014-03-07 – 2014-03-09 (×3): 1000 mg via ORAL
  Filled 2014-03-06 (×3): qty 2

## 2014-03-06 MED ORDER — CALCIUM CARBONATE 600 MG PO TABS: 600.0000 mg | ORAL_TABLET | Freq: Every day | ORAL | Status: DC

## 2014-03-06 MED ORDER — ENOXAPARIN SODIUM 30 MG/0.3ML ~~LOC~~ SOLN
30.0000 mg | SUBCUTANEOUS | Status: DC
Start: 2014-03-06 — End: 2014-03-08
  Administered 2014-03-06 – 2014-03-07 (×2): 30 mg via SUBCUTANEOUS
  Filled 2014-03-06 (×2): qty 0.3

## 2014-03-06 MED ORDER — SODIUM CHLORIDE 0.9 % IV SOLN
100.0000 mL/h | INTRAVENOUS | Status: DC
  Administered 2014-03-06: 100 mL/h via INTRAVENOUS

## 2014-03-06 MED ORDER — EZETIMIBE 10 MG PO TABS
10.0000 mg | ORAL_TABLET | Freq: Every day | ORAL | Status: DC
  Administered 2014-03-07 – 2014-03-09 (×3): 10 mg via ORAL
  Filled 2014-03-06 (×4): qty 1

## 2014-03-06 NOTE — ED Notes (Signed)
Pt appearing to have some trouble ambulating to wheelchair after being woken up to be discharged. MD Rosana Hoes to bedside to see patient, advised that patient stable enough to go home and explained to patients family that if patient was asleep and is not use to being up this late, that most likely was what was attributing to her behavior. Pt also stated that due to not having her hearing aids, she could not hear what we were trying to direct her to do. Pt alert, oriented, nad upon discharge.

## 2014-03-06 NOTE — Consult Note (Signed)
Referring Physician: Venia Minks    Chief Complaint: New onset left-sided weakness.  HPI: Pamela [name] is an 80 y.o. female history of hypertension, hyperlipidemia, peripheral artery disease, stroke, and atrial fibrillation not on anticoagulation, presenting with new onset of side weakness. Initial symptoms began several days ago. However, she started leaning towards the left side yesterday with more obvious weakness. He's been taking aspirin daily. MRI of her brain showed multiple areas of watershed infarct involving the right hemisphere. NIH stroke score was 0.  LSN: Unclear tPA Given: No: Unclear when last seen well mRankin:  Past Medical History  Diagnosis Date  . Hyperlipidemia   . Hypertension   . Fibromyalgia   . Thyroid disease     hyperparathyroid  . PAD (peripheral artery disease)   . TIA (transient ischemic attack) ~ 2009    "only 1 that I know of" (12/13/2011)  . Atrial fibrillation     Not on Coumadin. Noted during 02/2009 admission.   . Skin cancer     Unknown types  . Schatzki's ring     Non obstructing ring on EGD 02/2009   . Pancreatic mass 02/2009    Abnormal CT abdomen, with cystic area in head and circumferential thickening of midbody of stomach  . Fibula fracture 12/11/2011    left  . Stroke 2006    "drags right leg very slightly" (12/13/2011)  . GERD (gastroesophageal reflux disease)   . H/O hiatal hernia   . Hard of hearing     "totally deaf in the right; 50% is gone in the left" (12/13/2011)    Family History  Problem Relation Age of Onset  . Cancer Other     breast ca  . Cancer      mouth ca      Medications: I have reviewed the patient's current medications.  ROS: History obtained from patient's daughter and the patient  General ROS: negative for - chills, fatigue, fever, night sweats, weight gain or weight loss Psychological ROS: negative for - behavioral disorder, hallucinations, memory difficulties, mood swings or suicidal  ideation Ophthalmic ROS: negative for - blurry vision, double vision, eye pain or loss of vision ENT ROS: negative for - epistaxis, nasal discharge, oral lesions, sore throat, tinnitus or vertigo Allergy and Immunology ROS: negative for - hives or itchy/watery eyes Hematological and Lymphatic ROS: negative for - bleeding problems, bruising or swollen lymph nodes Endocrine ROS: negative for - galactorrhea, hair pattern changes, polydipsia/polyuria or temperature intolerance Respiratory ROS: negative for - cough, hemoptysis, shortness of breath or wheezing Cardiovascular ROS: negative for - chest pain, dyspnea on exertion, edema or irregular heartbeat Gastrointestinal ROS: negative for - abdominal pain, diarrhea, hematemesis, nausea/vomiting or stool incontinence Genito-Urinary ROS: negative for - dysuria, hematuria, incontinence or urinary frequency/urgency Musculoskeletal ROS: negative for - joint swelling or muscular weakness Neurological ROS: as noted in HPI Dermatological ROS: negative for rash and skin lesion changes  Physical Examination: Blood pressure 152/53, pulse 81, temperature 97.4 F (36.3 C), temperature source Oral, resp. rate 1, SpO2 94 %.  Appearance was that of an elderly lady of slender build who was alert and in no acute distress.  Neurologic Examination: Mental Status: Alert, oriented, thought content appropriate.  Speech fluent without evidence of aphasia. Able to follow commands without difficulty. Cranial Nerves: II-Visual fields were normal. III/IV/VI-Pupils were equal and reacted. Extraocular movements were full and conjugate.    V/VII-no facial numbness and no facial weakness. VIII-moderately severe hearing loss X-normal speech and symmetrical  palatal movement. Motor: 5/5 bilaterally with normal tone and bulk Sensory: Normal throughout. Deep Tendon Reflexes: Trace to 1+ and symmetric in upper extremities and absent in lower extremities. Plantars: Flexor  bilaterally Cerebellar: Normal finger-to-nose testing except for mild intention tremor bilaterally. Carotid auscultation: Normal  Ct Head Wo Contrast  03/05/2014   CLINICAL DATA:  Altered mental status 2 days ago, return to baseline, new episode of confusion today, generalized weakness and worsening incontinence.  EXAM: CT HEAD WITHOUT CONTRAST  TECHNIQUE: Contiguous axial images were obtained from the base of the skull through the vertex without intravenous contrast.  COMPARISON:  CT of the head May 05, 2012  FINDINGS: The ventricles and sulci are normal for age. No intraparenchymal hemorrhage, mass effect nor midline shift. Cough 1 supratentorial white matter hypodensities. No acute large vascular territory infarcts. Chronic LEFT thalamus lacunar infarct.  No abnormal extra-axial fluid collections. Basal cisterns are patent. Moderate calcific atherosclerosis of the carotid siphons.  No skull fracture. The included ocular globes and orbital contents are non-suspicious. The mastoid aircells and included paranasal sinuses are well-aerated.  IMPRESSION: No acute intracranial process.  Involutional changes. Moderate to severe white matter changes suggest chronic small vessel ischemic disease. Remote LEFT thalamus lacunar infarct.   Electronically Signed   By: Elon Alas   On: 03/05/2014 22:30   Mr Brain Wo Contrast  03/06/2014   CLINICAL DATA:  Weakness and intermittent confusion.  Prior strokes.  EXAM: MRI HEAD WITHOUT CONTRAST  TECHNIQUE: Multiplanar, multiecho pulse sequences of the brain and surrounding structures were obtained without intravenous contrast.  COMPARISON:  Head CT 02/23/2014  FINDINGS: There are small, patchy areas of acute infarction in the right cerebral hemisphere involving the centrum semiovale and posterior parietal lobes in a watershed distribution. No left cerebral hemispheric acute infarct is seen. There is no evidence of intracranial hemorrhage, mass, midline shift, or  extra-axial fluid collection. There is moderate cerebral atrophy. Patchy T2 hyperintensities in the subcortical and deep cerebral white matter are nonspecific but compatible with moderate chronic small vessel ischemic disease. Chronic lacunar infarcts are noted in the left thalamus, bilateral basal ganglia, and right corona radiata.  Prior bilateral cataract extraction is noted. Paranasal sinuses are clear. Small left mastoid effusion is noted. Visualized distal right internal carotid artery flow void in the neck is abnormal with the intracranial right ICA flow void appearing smaller in caliber diffusely compared to the left. Other major intracranial vascular flow voids are preserved.  IMPRESSION: 1. Acute right cerebral hemisphere watershed infarcts. 2. Abnormal appearance of the distal right internal carotid artery flow void, which may reflect high-grade stenosis or occlusion in the neck. 3. Moderate chronic small vessel ischemic disease and chronic lacunar infarcts as above.   Electronically Signed   By: Logan Bores   On: 03/06/2014 14:48   Dg Chest Port 1 View  03/05/2014   CLINICAL DATA:  Altered mental status and weakness.  EXAM: PORTABLE CHEST - 1 VIEW  COMPARISON:  12/14/2011  FINDINGS: Shallow inspiration. Patient rotated. The heart size and mediastinal contours are within normal limits. Both lungs are clear. The visualized skeletal structures are unremarkable.  IMPRESSION: No active disease.   Electronically Signed   By: Lucienne Capers M.D.   On: 03/05/2014 21:09    Assessment: 80 y.o. female with multiple risk factors for stroke including atrial fibrillation not on anticoagulation presenting with multiple watershed right cerebral infarctions.  Stroke Risk Factors - atrial fibrillation, hyperlipidemia and hypertension  Plan: 1. HgbA1c, fasting  lipid panel 2. MRA  of the brain without contrast 3. PT consult, OT consult, Speech consult 4. Echocardiogram 5. Carotid dopplers 6. Prophylactic  therapy-Antiplatelet med: Aspirin  7. Risk factor modification 8. Telemetry monitoring   C.R. Nicole Kindred, MD [facility] [phone]  03/06/2014, 5:30 PM

## 2014-03-06 NOTE — Progress Notes (Signed)
Pt arrived to unit per stretcher from ED with ED nurse tech.  wil monitor pt closely.   Pamela [name] I 03/06/2014 5:36 PM

## 2014-03-06 NOTE — ED Provider Notes (Signed)
CSN: [id number]     Arrival date & time 03/06/14  1226 History   First MD Initiated Contact with Patient 03/06/14 1232     Chief Complaint  Patient presents with  . Altered Mental Status     HPI  Patient presents approximately 8 hours after being discharged from the emergency department, with concern of ongoing weakness, disequilibrium, and a fall. Patient was evaluated yesterday after having several days of weakness, confusion, substantially decreased capacity to perform activities of daily living due to weakness and disequilibrium. Family notes that since discharge earlier today the patient continues to have generalized weakness, is notably different from baseline as of several days ago. No report of new fever in the interim, vomiting, or other infectious stigmata. Patient had a fall after discharge, when she was weak at home, she currently denies pain, but states that there is a bruise on the left elbow.   Past Medical History  Diagnosis Date  . Hyperlipidemia   . Hypertension   . Fibromyalgia   . Thyroid disease     hyperparathyroid  . PAD (peripheral artery disease)   . TIA (transient ischemic attack) ~ 2009    "only 1 that I know of" (12/13/2011)  . Atrial fibrillation     Not on Coumadin. Noted during 02/2009 admission.   . Skin cancer     Unknown types  . Schatzki's ring     Non obstructing ring on EGD 02/2009   . Pancreatic mass 02/2009    Abnormal CT abdomen, with cystic area in head and circumferential thickening of midbody of stomach  . Fibula fracture 12/11/2011    left  . Stroke 2006    "drags right leg very slightly" (12/13/2011)  . GERD (gastroesophageal reflux disease)   . H/O hiatal hernia   . Hard of hearing     "totally deaf in the right; 50% is gone in the left" (12/13/2011)   Past Surgical History  Procedure Laterality Date  . Appendectomy  1940  . Parathyroidectomy  1980  . Vaginal hysterectomy  1970   Family History  Problem Relation Age of  Onset  . Cancer Other     breast ca  . Cancer      mouth ca    History  Substance Use Topics  . Smoking status: Never Smoker   . Smokeless tobacco: Never Used  . Alcohol Use: No   OB History    No data available     Review of Systems  Constitutional:       Per HPI, otherwise negative  HENT:       Per HPI, otherwise negative  Respiratory:       Per HPI, otherwise negative  Cardiovascular:       Per HPI, otherwise negative  Gastrointestinal: Negative for vomiting.  Endocrine:       Negative aside from HPI  Genitourinary:       Neg aside from HPI   Musculoskeletal:       Per HPI, otherwise negative  Skin: Negative.   Neurological: Positive for weakness and light-headedness. Negative for syncope.      Allergies  Statins  Home Medications   Prior to Admission medications   Medication Sig Start Date End Date Taking? Authorizing Provider  amLODipine (NORVASC) 10 MG tablet take 1/2 tablet by mouth once daily Patient not taking: Reported on 03/05/2014 04/16/13   Lucille Passy, MD  amLODipine (NORVASC) 10 MG tablet Take 5 mg by mouth daily.  Historical Provider, MD  Ascorbic Acid (VITAMIN C) 500 MG tablet Take 500 mg by mouth daily.      Historical Provider, MD  aspirin 325 MG tablet Take 325 mg by mouth daily.      Historical Provider, MD  calcium carbonate (OS-CAL) 600 MG TABS Take 600 mg by mouth daily.      Historical Provider, MD  ezetimibe (ZETIA) 10 MG tablet Take 1 tablet (10 mg total) by mouth daily. 03/04/14   Lucille Passy, MD  losartan (COZAAR) 100 MG tablet take 1 tablet by mouth daily Patient taking differently: Take 100 mg by mouth daily. take 1 tablet by mouth daily 04/16/13   Lucille Passy, MD  magnesium oxide (MAG-OX) 400 (241.3 MG) MG tablet take 1 tablet by mouth once daily 03/02/13   Lucille Passy, MD  metoprolol tartrate (LOPRESSOR) 25 MG tablet take 1 tablet by mouth at bedtime 04/16/13   Lucille Passy, MD  Multiple Vitamins-Minerals (CENTRUM SILVER PO)  Take 1 tablet by mouth daily.      Historical Provider, MD  ranitidine (ZANTAC) 150 MG tablet take 1 tablet by mouth twice a day 11/20/13   Lucille Passy, MD  senna (SENOKOT) 8.6 MG tablet Take 1 tablet by mouth daily. , hold for loose bowels    Historical Provider, MD  urea (CARMOL) 40 % CREA Apply bid to heel, disp 30 grams 02/02/13   Spencer Copland, MD   BP 143/60 mmHg  Pulse 77  Temp(Src) 97.7 F (36.5 C) (Oral)  Resp 18  SpO2 99% Physical Exam  Constitutional: She is oriented to person, place, and time. She appears well-developed and well-nourished. No distress.  HENT:  Head: Normocephalic and atraumatic.  Eyes: Conjunctivae and EOM are normal.  Cardiovascular: Normal rate and regular rhythm.   Pulmonary/Chest: Effort normal and breath sounds normal. No stridor. No respiratory distress.  Abdominal: She exhibits no distension.  Musculoskeletal: She exhibits no edema.  Neurological: She is alert and oriented to person, place, and time. She displays atrophy. She displays no tremor. No cranial nerve deficit. She displays no seizure activity.  Patient has very slow motion, but does follow commands appropriately. Awake, alert, oriented, though with extreme hearing loss Speech is clear, face is symmetric  Skin: Skin is warm and dry.  Psychiatric: She has a normal mood and affect.  Nursing note and vitals reviewed.   ED Course  Procedures (including critical care time) Labs Review Labs Reviewed  CBC WITH DIFFERENTIAL  COMPREHENSIVE METABOLIC PANEL  LACTIC ACID, PLASMA  PROTIME-INR  URINALYSIS, ROUTINE W REFLEX MICROSCOPIC    Imaging Review Ct Head Wo Contrast  03/05/2014   CLINICAL DATA:  Altered mental status 2 days ago, return to baseline, new episode of confusion today, generalized weakness and worsening incontinence.  EXAM: CT HEAD WITHOUT CONTRAST  TECHNIQUE: Contiguous axial images were obtained from the base of the skull through the vertex without intravenous contrast.   COMPARISON:  CT of the head May 05, 2012  FINDINGS: The ventricles and sulci are normal for age. No intraparenchymal hemorrhage, mass effect nor midline shift. Cough 1 supratentorial white matter hypodensities. No acute large vascular territory infarcts. Chronic LEFT thalamus lacunar infarct.  No abnormal extra-axial fluid collections. Basal cisterns are patent. Moderate calcific atherosclerosis of the carotid siphons.  No skull fracture. The included ocular globes and orbital contents are non-suspicious. The mastoid aircells and included paranasal sinuses are well-aerated.  IMPRESSION: No acute intracranial process.  Involutional changes. Moderate  to severe white matter changes suggest chronic small vessel ischemic disease. Remote LEFT thalamus lacunar infarct.   Electronically Signed   By: Elon Alas   On: 03/05/2014 22:30   Dg Chest Port 1 View  03/05/2014   CLINICAL DATA:  Altered mental status and weakness.  EXAM: PORTABLE CHEST - 1 VIEW  COMPARISON:  12/14/2011  FINDINGS: Shallow inspiration. Patient rotated. The heart size and mediastinal contours are within normal limits. Both lungs are clear. The visualized skeletal structures are unremarkable.  IMPRESSION: No active disease.   Electronically Signed   By: Lucienne Capers M.D.   On: 03/05/2014 21:09   I reviewed the patient's chart from earlier today, as well as radiographic findings.   EKG Interpretation   Date/Time:  Saturday March 06 2014 12:54:48 EST Ventricular Rate:  66 PR Interval:  54 QRS Duration: 72 QT Interval:  424 QTC Calculation: 444 R Axis:   74 Text Interpretation:  Sinus rhythm Atrial premature complexes Short PR  interval Artifact in lead(s) I III aVR aVL aVF V2 Sinus rhythm Artifact  Premature atrial complexes Abnormal ekg Confirmed by Carmin Muskrat  MD  ([id number]) on 03/06/2014 1:12:27 PM     3:04 PM Patient in no distress.  MDM   Final diagnoses:  Weakness   acute CVA  Patient presents with  persistent/worsening disequilibrium, weakness. Patient is awake and alert, and has no focal neurologic deficits. Patient has no evidence for substantial internal trauma from her fall today. With concern for new stroke, I discussed the patient's case with neurology, and the patient was admitted for further evaluation and management.   Carmin Muskrat, MD 03/06/14 [phone]

## 2014-03-06 NOTE — Progress Notes (Signed)
Report recd from [place], [place] in ED. Will monitor for pt's arrival to unit.   Angeline Slim I 03/06/2014 4:20 PM

## 2014-03-06 NOTE — H&P (Signed)
[facility] History and Physical  Pamela [name] RCB:[id number] DOB: 01-19-1917 DOA: 03/06/2014  Referring physician: EDP PCP: Arnette Norris, MD   Chief Complaint: fall this am and confusion  HPI: [name] is a 80 y.o. female with prior h/o CVA 10 to 15 years ago, hypertnsion, paroxysmal afib not on coumadin. Comes in for not feeling well since Wednesday, with some confusion and a fall earlier this am. On arrivalt o ED, MRI of the brain showed acute right cerebral hemisphere watershed infarcts. She was referred to admission and neurology consulted. She denies any other complaints. She lives alone and her daughter is the care giver who lives close by. Patient was hard of hearing and confused and most of the history was obtained from the daughter.    Review of Systems:  Patient confused, could not obtain detailed H&P  Past Medical History  Diagnosis Date  . Hyperlipidemia   . Hypertension   . Fibromyalgia   . Thyroid disease     hyperparathyroid  . PAD (peripheral artery disease)   . TIA (transient ischemic attack) ~ 2009    "only 1 that I know of" (12/13/2011)  . Atrial fibrillation     Not on Coumadin. Noted during 02/2009 admission.   . Skin cancer     Unknown types  . Schatzki's ring     Non obstructing ring on EGD 02/2009   . Pancreatic mass 02/2009    Abnormal CT abdomen, with cystic area in head and circumferential thickening of midbody of stomach  . Fibula fracture 12/11/2011    left  . Stroke 2006    "drags right leg very slightly" (12/13/2011)  . GERD (gastroesophageal reflux disease)   . H/O hiatal hernia   . Hard of hearing     "totally deaf in the right; 50% is gone in the left" (12/13/2011)   Past Surgical History  Procedure Laterality Date  . Appendectomy  1940  . Parathyroidectomy  1980  . Vaginal hysterectomy  1970   Social History:  reports that she has never smoked. She has never used smokeless tobacco. She reports that she does not drink  alcohol or use illicit drugs.  Allergies  Allergen Reactions  . Statins Other (See Comments)    unknown    Family History  Problem Relation Age of Onset  . Cancer Other     breast ca  . Cancer      mouth ca      Prior to Admission medications   Medication Sig Start Date End Date Taking? Authorizing Provider  amLODipine (NORVASC) 10 MG tablet Take 5 mg by mouth daily.   Yes Historical Provider, MD  Ascorbic Acid (VITAMIN C) 500 MG tablet Take 500 mg by mouth daily.     Yes Historical Provider, MD  aspirin 325 MG tablet Take 325 mg by mouth daily.     Yes Historical Provider, MD  calcium carbonate (OS-CAL) 600 MG TABS Take 600 mg by mouth daily.     Yes Historical Provider, MD  ezetimibe (ZETIA) 10 MG tablet Take 1 tablet (10 mg total) by mouth daily. Patient taking differently: Take 10 mg by mouth daily at 3 pm.  03/04/14  Yes Lucille Passy, MD  losartan (COZAAR) 100 MG tablet take 1 tablet by mouth daily Patient taking differently: Take 100 mg by mouth daily at 3 pm. take 1 tablet by mouth daily 04/16/13  Yes Lucille Passy, MD  magnesium oxide (MAG-OX) 400 (241.3 MG) MG  tablet take 1 tablet by mouth once daily 03/02/13  Yes Lucille Passy, MD  metoprolol tartrate (LOPRESSOR) 25 MG tablet take 1 tablet by mouth at bedtime Patient taking differently: Take 25 mg by mouth at bedtime. take 1 tablet by mouth at bedtime 04/16/13  Yes Lucille Passy, MD  Multiple Vitamins-Minerals (CENTRUM SILVER PO) Take 1 tablet by mouth daily.     Yes Historical Provider, MD  ranitidine (ZANTAC) 150 MG tablet Take 150 mg by mouth 2 (two) times daily.   Yes Historical Provider, MD  RESTASIS 0.05 % ophthalmic emulsion Place 1 drop into both eyes 2 (two) times daily. 02/02/14  Yes Historical Provider, MD  senna (SENOKOT) 8.6 MG tablet Take 1 tablet by mouth daily. hold for loose bowels   Yes Historical Provider, MD  urea (CARMOL) 40 % CREA Apply bid to heel, disp 30 grams 02/02/13  Yes Owens Loffler, MD    Physical Exam: Filed Vitals:   03/06/14 1244 03/06/14 1508 03/06/14 1627  BP: 143/60 152/53   Pulse: 77 81   Temp: 97.7 F (36.5 C)  97.4 F (36.3 C)  TempSrc: Oral    Resp: 18 1   SpO2: 99% 94%     Wt Readings from Last 3 Encounters:  03/05/14 56.246 kg (124 lb)  01/11/14 56.586 kg (124 lb 12 oz)  07/06/13 58.287 kg (128 lb 8 oz)    General:  Appears calm and comfortable Eyes: PERRL, normal lids, irises & conjunctiva Neck: no LAD, masses or thyromegaly Cardiovascular: RRR, no m/r/g. No LE edema. Respiratory: CTA bilaterally, no w/r/r. Normal respiratory effort. Abdomen: soft, ntnd Skin: no rash or induration seen on limited exam Musculoskeletal: varicose veins.  Neurologic: slow speech, no facial droop. Confused. Alert to person. Not to place an time. Able to follow commands. No motor deficits. Gait could not be tested.           Labs on Admission:  Basic Metabolic Panel:  Recent Labs Lab 03/05/14 1905 03/06/14 1300  NA 141 139  K 4.2 4.5  CL 101 99  CO2 27 28  GLUCOSE 101* 93  BUN 29* 21  CREATININE 0.79 0.72  CALCIUM 10.0 9.6   Liver Function Tests:  Recent Labs Lab 03/05/14 1905 03/06/14 1300  AST 29 32  ALT 18 19  ALKPHOS 79 80  BILITOT 0.4 0.5  PROT 7.1 6.9  ALBUMIN 4.1 3.9   No results for input(s): LIPASE, AMYLASE in the last 168 hours. No results for input(s): AMMONIA in the last 168 hours. CBC:  Recent Labs Lab 03/05/14 1905 03/06/14 1300  WBC 6.1 5.8  NEUTROABS  --  4.4  HGB 13.1 13.6  HCT 39.8 41.9  MCV 93.9 96.5  PLT 206 188   Cardiac Enzymes: No results for input(s): CKTOTAL, CKMB, CKMBINDEX, TROPONINI in the last 168 hours.  BNP (last 3 results) No results for input(s): PROBNP in the last 8760 hours. CBG: No results for input(s): GLUCAP in the last 168 hours.  Radiological Exams on Admission: Ct Head Wo Contrast  03/05/2014   CLINICAL DATA:  Altered mental status 2 days ago, return to baseline, new episode of  confusion today, generalized weakness and worsening incontinence.  EXAM: CT HEAD WITHOUT CONTRAST  TECHNIQUE: Contiguous axial images were obtained from the base of the skull through the vertex without intravenous contrast.  COMPARISON:  CT of the head May 05, 2012  FINDINGS: The ventricles and sulci are normal for age. No intraparenchymal hemorrhage, mass effect  nor midline shift. Cough 1 supratentorial white matter hypodensities. No acute large vascular territory infarcts. Chronic LEFT thalamus lacunar infarct.  No abnormal extra-axial fluid collections. Basal cisterns are patent. Moderate calcific atherosclerosis of the carotid siphons.  No skull fracture. The included ocular globes and orbital contents are non-suspicious. The mastoid aircells and included paranasal sinuses are well-aerated.  IMPRESSION: No acute intracranial process.  Involutional changes. Moderate to severe white matter changes suggest chronic small vessel ischemic disease. Remote LEFT thalamus lacunar infarct.   Electronically Signed   By: Elon Alas   On: 03/05/2014 22:30   Mr Brain Wo Contrast  03/06/2014   CLINICAL DATA:  Weakness and intermittent confusion.  Prior strokes.  EXAM: MRI HEAD WITHOUT CONTRAST  TECHNIQUE: Multiplanar, multiecho pulse sequences of the brain and surrounding structures were obtained without intravenous contrast.  COMPARISON:  Head CT 02/23/2014  FINDINGS: There are small, patchy areas of acute infarction in the right cerebral hemisphere involving the centrum semiovale and posterior parietal lobes in a watershed distribution. No left cerebral hemispheric acute infarct is seen. There is no evidence of intracranial hemorrhage, mass, midline shift, or extra-axial fluid collection. There is moderate cerebral atrophy. Patchy T2 hyperintensities in the subcortical and deep cerebral white matter are nonspecific but compatible with moderate chronic small vessel ischemic disease. Chronic lacunar infarcts are  noted in the left thalamus, bilateral basal ganglia, and right corona radiata.  Prior bilateral cataract extraction is noted. Paranasal sinuses are clear. Small left mastoid effusion is noted. Visualized distal right internal carotid artery flow void in the neck is abnormal with the intracranial right ICA flow void appearing smaller in caliber diffusely compared to the left. Other major intracranial vascular flow voids are preserved.  IMPRESSION: 1. Acute right cerebral hemisphere watershed infarcts. 2. Abnormal appearance of the distal right internal carotid artery flow void, which may reflect high-grade stenosis or occlusion in the neck. 3. Moderate chronic small vessel ischemic disease and chronic lacunar infarcts as above.   Electronically Signed   By: Logan Bores   On: 03/06/2014 14:48   Dg Chest Port 1 View  03/05/2014   CLINICAL DATA:  Altered mental status and weakness.  EXAM: PORTABLE CHEST - 1 VIEW  COMPARISON:  12/14/2011  FINDINGS: Shallow inspiration. Patient rotated. The heart size and mediastinal contours are within normal limits. Both lungs are clear. The visualized skeletal structures are unremarkable.  IMPRESSION: No active disease.   Electronically Signed   By: Lucienne Capers M.D.   On: 03/05/2014 21:09    EKG: sinus rhythm  Assessment/Plan Active Problems:   Stroke   CVA (cerebral infarction)  Acute right cerebral CVA:  Admit to tele. Stroke work up in progress. On 325mg  of aspirin. Started her on plavix 75 mg daily.  Neurology consulted.    Hypertension: Permissive hypertension. Resume home meds in am.     Code Status: presumed full code.  DVT Prophylaxis: Family Communication:family at bedside Disposition Plan: pending  Time spent: 29 min  [facility] Pager [phone]

## 2014-03-06 NOTE — ED Notes (Signed)
Pt arrived from home by PTAR with c/o AMS. Pt was seen and discharged from hospital this morning around 0200 for same. Pt is A&o but forgets simple things such as how to get out of the car and how to turn and sit down. Pt daughter at bedside stated that pt fell this morning and pt denies hitting head. Pt is very hard of hearing. Denies any pain at this time. Pt normally stays home alone but daughter stayed with her last night.

## 2014-03-07 DIAGNOSIS — G44039 Episodic paroxysmal hemicrania, not intractable: Secondary | ICD-10-CM

## 2014-03-07 DIAGNOSIS — I059 Rheumatic mitral valve disease, unspecified: Secondary | ICD-10-CM

## 2014-03-07 DIAGNOSIS — R531 Weakness: Secondary | ICD-10-CM

## 2014-03-07 DIAGNOSIS — I482 Chronic atrial fibrillation: Secondary | ICD-10-CM

## 2014-03-07 LAB — LIPID PANEL
CHOL/HDL RATIO: 5.1 ratio
CHOLESTEROL: 209 mg/dL — AB (ref 0–200)
HDL: 41 mg/dL (ref >39)
LDL Cholesterol: 129 mg/dL — ABNORMAL HIGH (ref 0–99)
TRIGLYCERIDES: 195 mg/dL — AB (ref <150)
VLDL: 39 mg/dL (ref 0–40)

## 2014-03-07 LAB — URINE CULTURE: Colony Count: >=100000

## 2014-03-07 LAB — HEMOGLOBIN A1C
Hgb A1c MFr Bld: 5.5 % (ref <5.7)
Mean Plasma Glucose: 111 mg/dL (ref <117)

## 2014-03-07 NOTE — Progress Notes (Signed)
STROKE TEAM PROGRESS NOTE   HISTORY Pamela [name] is an 80 y.o. female history of hypertension, hyperlipidemia, peripheral artery disease, stroke, and atrial fibrillation not on anticoagulation, presenting with new onset of left sided weakness. Initial symptoms began several days ago. However, she started leaning towards the left side yesterday with more obvious weakness. He's been taking aspirin daily. MRI of her brain showed multiple areas of watershed infarct involving the right hemisphere. NIH stroke score was 0.  LSN: Unclear tPA Given: No: Unclear when last seen well mRankin:   SUBJECTIVE (INTERVAL HISTORY) The patient's great niece and another family member were at the bedside. They report that the patient had been very active and was still driving prior to admission. She lives alone. Apparently she had a stroke in 2001 as well. Dr. Leonie Man discussed the pros and cons of anticoagulation. He recommended starting Eliquis 2.5 mg twice a day on Monday if the family agrees.   OBJECTIVE Temp:  [97.4 F (36.3 C)-98.4 F (36.9 C)] 98.4 F (36.9 C) (12/13 0300) Pulse Rate:  [61-85] 62 (12/13 0500) Cardiac Rhythm:  [-] Other (Comment) (12/12 2044) Resp:  [1-18] 16 (12/13 0500) BP: (138-166)/(52-81) 162/52 mmHg (12/13 0500) SpO2:  [94 %-99 %] 97 % (12/13 0500) Weight:  [129 lb 4.8 oz (58.65 kg)] 129 lb 4.8 oz (58.65 kg) (12/12 1705)  No results for input(s): GLUCAP in the last 168 hours.  Recent Labs Lab 03/05/14 1905 03/06/14 1300  NA 141 139  K 4.2 4.5  CL 101 99  CO2 27 28  GLUCOSE 101* 93  BUN 29* 21  CREATININE 0.79 0.72  CALCIUM 10.0 9.6    Recent Labs Lab 03/05/14 1905 03/06/14 1300  AST 29 32  ALT 18 19  ALKPHOS 79 80  BILITOT 0.4 0.5  PROT 7.1 6.9  ALBUMIN 4.1 3.9    Recent Labs Lab 03/05/14 1905 03/06/14 1300  WBC 6.1 5.8  NEUTROABS  --  4.4  HGB 13.1 13.6  HCT 39.8 41.9  MCV 93.9 96.5  PLT 206 188   No results for input(s): CKTOTAL, CKMB,  CKMBINDEX, TROPONINI in the last 168 hours.  Recent Labs  03/06/14 1300  LABPROT 13.5  INR 1.02    Recent Labs  03/05/14 2117 03/06/14 1326  COLORURINE YELLOW YELLOW  LABSPEC 1.018 1.005  PHURINE 6.0 6.5  GLUCOSEU NEGATIVE NEGATIVE  HGBUR NEGATIVE NEGATIVE  BILIRUBINUR NEGATIVE NEGATIVE  KETONESUR 15* 15*  PROTEINUR NEGATIVE NEGATIVE  UROBILINOGEN 0.2 0.2  NITRITE NEGATIVE NEGATIVE  LEUKOCYTESUR MODERATE* NEGATIVE       Component Value Date/Time   CHOL 209* 03/07/2014 0540   TRIG 195* 03/07/2014 0540   HDL 41 03/07/2014 0540   CHOLHDL 5.1 03/07/2014 0540   VLDL 39 03/07/2014 0540   LDLCALC 129* 03/07/2014 0540   Lab Results  Component Value Date   HGBA1C  03/19/2009    5.8 (NOTE) The ADA recommends the following therapeutic goal for glycemic control related to Hgb A1c measurement: Goal of therapy: <6.5 Hgb A1c  Reference: American Diabetes Association: Clinical Practice Recommendations 2010, Diabetes Care, 2010, 33: (Suppl  1).   No results found for: LABOPIA, COCAINSCRNUR, LABBENZ, AMPHETMU, Ainsley Spinner   Recent Labs Lab 03/06/14 1300  ETH <11   Dg Chest Port 1 View 03/05/2014    No active disease.     Ct Head Wo Contrast 03/05/2014    No acute intracranial process.  Involutional changes. Moderate to severe white matter changes suggest chronic small vessel ischemic  disease. Remote LEFT thalamus lacunar infarct.      Mr Brain Wo Contrast 03/06/2014    1. Acute right cerebral hemisphere watershed infarcts.  2. Abnormal appearance of the distal right internal carotid artery flow void, which may reflect high-grade stenosis or occlusion in the neck.  3. Moderate chronic small vessel ischemic disease and chronic lacunar infarcts as above.      Mr Jodene Nam Head/brain Wo Cm 03/06/2014   1. No evidence of major intracranial arterial occlusion.  2. Abnormal appearance of the visualized portions of both distal cervical internal carotid arteries with findings  suggestive of fibromuscular dysplasia. The distal right ICA is patent but with evidence of decreased flow compared to the left.  3. Intracranial atherosclerosis resulting in moderate right supraclinoid ICA stenosis, moderate to severe basilar stenosis, moderate to severe right P2 stenosis, and moderate branch vessel irregularity.        PHYSICAL EXAM Frail elderly caucasian lady not in distress.  Afebrile. Head is nontraumatic. Neck is supple without bruit. Hearing is poor Cardiac exam no murmur or gallop. Lungs are clear to auscultation. Distal pulses are well felt. Neurological Exam : Awake alert oriented x 2.diminished attention, registration and recall .Follows one and occasional two-step commands normal speech and language. Mild left lower face asymmetry. Tongue midline. No drift. Mild diminished fine finger movements on left. Orbits right over left upper extremity. Mild left grip weak.. Normal sensation . Normal coordination.    ASSESSMENT/PLAN Pamela [name] is a 80 y.o. female with history of hypertension, hyperlipidemia, peripheral artery disease, previous stroke, and atrial fibrillation not anticoagulated presenting with new onset of left-sided weakness. Probable recent TIA and subsequent stroke. She did not receive IV t-PA as he time of onset was unclear.  Stroke:  Non-dominant infarct - embolic secondary to atrial fibrillation and carotid stenosis.  Resultant  Mild left sided hemiparesis  MRI  Acute right cerebral hemisphere watershed infarcts.   MRA  as above  Carotid Doppler - 1-39% ICA stenosis. Vertebral artery flow is antegrade.  2D Echo - EF 60-65%. No cardiac source of emboli identified  LDL 129   HgbA1c - 5.8  Lovenox for VTE prophylaxis    heart healthy diet with thin liquids.  aspirin 325 mg orally every day prior to admission, now on clopidogrel 75 mg orally every day  Patient counseled to be compliant with her antithrombotic medications  Ongoing  aggressive stroke risk factor management  Therapy recommendations:  Pending  Disposition:  Pending ( lived alone prior to admission )  Hypertension  Home meds:   Norvasc 5 mg daily, Cozaar 100 mg daily, metoprolol 25 mg at bedtime. (All currently on hold)  Stable  Permissive hypertension <220/120 for 24-48 hours and then gradually normalize within 5-7 days   Hyperlipidemia  Home meds:   Zetia 10 mg daily - resumed in hospital  LDL 129, goal < 70 - History of statin intolerance  Consider trial of low-dose statin   Other Stroke Risk Factors  Advanced age  Hx stroke/TIA   Other Active Problems  Hyperlipidemia with history of statin intolerance  Atrial fibrillation not anticoagulated - Eliquis recommended at 2.5 mg twice a day. Final decision tomorrow.  Great niece who reports the patient is very compliant with medications.  Possible carotid artery disease.  Other Pertinent History  Hard of hearing  Hospital day # [facility] Pager [phone] 03/07/2014, 9:32 AM I have personally examined this patient, reviewed notes,  independently viewed imaging studies, participated in medical decision making and plan of care. I have made any additions or clarifications directly to the above note. Agree with note above. Right brain watershed looking infarcts in etiology indeterminate cardioembolic from atrial fibrillation versus symptomatic cavernous carotid stenosis . Antony Contras, MD Medical Director [facility] Pager: [phone] 03/07/2014 3:08 PM     To contact Stroke Continuity provider, please refer to [email or web address] After hours, contact General Neurology

## 2014-03-07 NOTE — Progress Notes (Signed)
Echo Lab  2D Echocardiogram completed.  [place], RDCS 03/07/2014 9:56 AM

## 2014-03-07 NOTE — Progress Notes (Signed)
VASCULAR LAB PRELIMINARY  PRELIMINARY  PRELIMINARY  PRELIMINARY  Carotid Dopplers completed.    Preliminary report:  1-39% ICA stenosis.  Vertebral artery flow is antegrade.   Levi Klaiber, RVT 03/07/2014, 9:33 AM

## 2014-03-07 NOTE — Progress Notes (Signed)
[facility] PROGRESS NOTE  Pamela [name] TDS:[id number] DOB: [birth year] DOA: 03/06/2014 PCP: Arnette Norris, MD  Assessment/Plan: 1. Acute R cerebral hemisphere watershed infarcts 1. Neurology following 2. Pending 2d echo with grade 2 diastolic dysfunction, EF 26-20% 3. Carotid dopplers without stenosis 4. PT/OT 5. On Plavix 2. HTN 1. Allowing permissive HTN 2. Stable 3. HLD 1. LDL 129 2. Pt with documented allergy to statin 3. On Zetia 4. Afib 1. Rate controlled 5. DVT prophylaxis 1. Lovenox subQ  Code Status: Full Family Communication: Pt in room Disposition Plan: Pending   Consultants:  Neurology  Procedures:    Antibiotics:   (indicate start date, and stop date if known)  HPI/Subjective: No acute events noted overnight.   Objective: Filed Vitals:   03/06/14 2300 03/07/14 0100 03/07/14 0300 03/07/14 0500  BP: 140/59 148/57 160/61 162/52  Pulse: 61 72 68 62  Temp: 98.2 F (36.8 C) 98.3 F (36.8 C) 98.4 F (36.9 C)   TempSrc: Oral Oral Oral Oral  Resp: 14 16 14 16   Height:      Weight:      SpO2: 95% 96% 97% 97%   No intake or output data in the 24 hours ending 03/07/14 0844 Filed Weights   03/06/14 1705  Weight: 58.65 kg (129 lb 4.8 oz)    Exam:   General:  Awake, in nad  Cardiovascular: regular, s1, s2  Respiratory: normal resp effort, no wheezing  Abdomen: soft,nondistended  Musculoskeletal: perfused,no clubbing   Data Reviewed: Basic Metabolic Panel:  Recent Labs Lab 03/05/14 1905 03/06/14 1300  NA 141 139  K 4.2 4.5  CL 101 99  CO2 27 28  GLUCOSE 101* 93  BUN 29* 21  CREATININE 0.79 0.72  CALCIUM 10.0 9.6   Liver Function Tests:  Recent Labs Lab 03/05/14 1905 03/06/14 1300  AST 29 32  ALT 18 19  ALKPHOS 79 80  BILITOT 0.4 0.5  PROT 7.1 6.9  ALBUMIN 4.1 3.9   No results for input(s): LIPASE, AMYLASE in the last 168 hours. No results for input(s): AMMONIA in the last 168 hours. CBC:  Recent Labs Lab  03/05/14 1905 03/06/14 1300  WBC 6.1 5.8  NEUTROABS  --  4.4  HGB 13.1 13.6  HCT 39.8 41.9  MCV 93.9 96.5  PLT 206 188   Cardiac Enzymes: No results for input(s): CKTOTAL, CKMB, CKMBINDEX, TROPONINI in the last 168 hours. BNP (last 3 results) No results for input(s): PROBNP in the last 8760 hours. CBG: No results for input(s): GLUCAP in the last 168 hours.  No results found for this or any previous visit (from the past 240 hour(s)).   Studies: Ct Head Wo Contrast  03/05/2014   CLINICAL DATA:  Altered mental status 2 days ago, return to baseline, new episode of confusion today, generalized weakness and worsening incontinence.  EXAM: CT HEAD WITHOUT CONTRAST  TECHNIQUE: Contiguous axial images were obtained from the base of the skull through the vertex without intravenous contrast.  COMPARISON:  CT of the head May 05, 2012  FINDINGS: The ventricles and sulci are normal for age. No intraparenchymal hemorrhage, mass effect nor midline shift. Cough 1 supratentorial white matter hypodensities. No acute large vascular territory infarcts. Chronic LEFT thalamus lacunar infarct.  No abnormal extra-axial fluid collections. Basal cisterns are patent. Moderate calcific atherosclerosis of the carotid siphons.  No skull fracture. The included ocular globes and orbital contents are non-suspicious. The mastoid aircells and included paranasal sinuses are well-aerated.  IMPRESSION: No acute  intracranial process.  Involutional changes. Moderate to severe white matter changes suggest chronic small vessel ischemic disease. Remote LEFT thalamus lacunar infarct.   Electronically Signed   By: Elon Alas   On: 03/05/2014 22:30   Mr Brain Wo Contrast  03/06/2014   CLINICAL DATA:  Weakness and intermittent confusion.  Prior strokes.  EXAM: MRI HEAD WITHOUT CONTRAST  TECHNIQUE: Multiplanar, multiecho pulse sequences of the brain and surrounding structures were obtained without intravenous contrast.   COMPARISON:  Head CT 02/23/2014  FINDINGS: There are small, patchy areas of acute infarction in the right cerebral hemisphere involving the centrum semiovale and posterior parietal lobes in a watershed distribution. No left cerebral hemispheric acute infarct is seen. There is no evidence of intracranial hemorrhage, mass, midline shift, or extra-axial fluid collection. There is moderate cerebral atrophy. Patchy T2 hyperintensities in the subcortical and deep cerebral white matter are nonspecific but compatible with moderate chronic small vessel ischemic disease. Chronic lacunar infarcts are noted in the left thalamus, bilateral basal ganglia, and right corona radiata.  Prior bilateral cataract extraction is noted. Paranasal sinuses are clear. Small left mastoid effusion is noted. Visualized distal right internal carotid artery flow void in the neck is abnormal with the intracranial right ICA flow void appearing smaller in caliber diffusely compared to the left. Other major intracranial vascular flow voids are preserved.  IMPRESSION: 1. Acute right cerebral hemisphere watershed infarcts. 2. Abnormal appearance of the distal right internal carotid artery flow void, which may reflect high-grade stenosis or occlusion in the neck. 3. Moderate chronic small vessel ischemic disease and chronic lacunar infarcts as above.   Electronically Signed   By: Logan Bores   On: 03/06/2014 14:48   Dg Chest Port 1 View  03/05/2014   CLINICAL DATA:  Altered mental status and weakness.  EXAM: PORTABLE CHEST - 1 VIEW  COMPARISON:  12/14/2011  FINDINGS: Shallow inspiration. Patient rotated. The heart size and mediastinal contours are within normal limits. Both lungs are clear. The visualized skeletal structures are unremarkable.  IMPRESSION: No active disease.   Electronically Signed   By: Lucienne Capers M.D.   On: 03/05/2014 21:09   Mr Jodene Nam Head/brain Wo Cm  03/06/2014   CLINICAL DATA:  Weakness and intermittent confusion.  Prior  strokes.  EXAM: MRA HEAD WITHOUT CONTRAST  TECHNIQUE: Angiographic images of the Circle of Willis were obtained using MRA technique without intravenous contrast.  COMPARISON:  None.  FINDINGS: The visualized distal vertebral arteries are patent with the left being slightly larger than the right. Left PICA origin is patent. Right PICA is not identified. Basilar artery is tortuous with moderate to severe stenosis in its midportion. Right AICA and bilateral SCA origins are patent. There is a fetal type origin of the left PCA. There is mild to moderate irregularity of the PCAs diffusely with mild narrowing of both P2 segments. A moderate to severe focal stenosis is noted more distally in the right P2 segment.  The visualized distal portions of the cervical internal carotid arteries demonstrate areas of abnormal narrowing and dilatation bilaterally with regions of short segment dissection and pseudoaneurysm formation questioned. The intracranial internal carotid arteries are patent to the carotid termini bilaterally, however the intracranial right ICA is mildly smaller in caliber diffusely compared to the left with decreased intensity of flow related enhancement. There is moderate stenosis of the distal right supraclinoid ICA.  The right M1 segment is patent without stenosis. The left M1 segment is patent with irregular, mild narrowing.  There is mild to moderate bilateral MCA branch vessel irregularity. The A1 segments are patent with the left being mildly dominant and with evidence of mild proximal A1 stenosis on the right. Anterior communicating artery is patent. There are multiple foci of mild to moderate stenosis involving A2 and more distal branches.  IMPRESSION: 1. No evidence of major intracranial arterial occlusion. 2. Abnormal appearance of the visualized portions of both distal cervical internal carotid arteries with findings suggestive of fibromuscular dysplasia. The distal right ICA is patent but with  evidence of decreased flow compared to the left. 3. Intracranial atherosclerosis resulting in moderate right supraclinoid ICA stenosis, moderate to severe basilar stenosis, moderate to severe right P2 stenosis, and moderate branch vessel irregularity.   Electronically Signed   By: Logan Bores   On: 03/06/2014 18:32    Scheduled Meds: . calcium carbonate  1,000 mg of elemental calcium Oral Q breakfast  . clopidogrel  75 mg Oral Daily  . enoxaparin (LOVENOX) injection  30 mg Subcutaneous Q24H  . ezetimibe  10 mg Oral Daily   Continuous Infusions: . sodium chloride    . sodium chloride    . sodium chloride 100 mL/hr (03/06/14 1746)    Active Problems:   Stroke   CVA (cerebral infarction)    Time spent: 52min  Candi Profit, [place] [facility] Pager [phone]. If 7PM-7AM, please contact night-coverage at [email or web address], password [facility] 03/07/2014, 8:44 AM  LOS: 1 day

## 2014-03-07 NOTE — Evaluation (Signed)
Physical Therapy Evaluation Patient Details Name: Pamela [name] MRN: [id number] DOB: 1916-06-24 Today's Date: 03/07/2014   History of Present Illness    Pamela [name] is an 80 y.o. female history of hypertension, hyperlipidemia, peripheral artery disease, stroke, and atrial fibrillation not on anticoagulation, presenting with new onset of left sided weakness. Initial symptoms began several days ago. However, she started leaning towards the left side yesterday with more obvious weakness. He's been taking aspirin daily. MRI of her brain showed multiple areas of watershed infarct involving the right hemisphere. NIH stroke score was 0.  Clinical Impression  Pt admitted with above diagnosis. Pt currently with functional limitations due to balance and endurance deficits.  Pt will need skilled NHP for therapy as she has left hemibody weakness and inattention.  Pt will benefit from skilled PT to increase their independence and safety with mobility to allow discharge to the venue listed below.      Follow Up Recommendations SNF;Supervision/Assistance - 24 hour (wants to go to SNF in [place])    Equipment Recommendations  None recommended by PT    Recommendations for Other Services       Precautions / Restrictions Precautions Precautions: Fall Restrictions Weight Bearing Restrictions: No      Mobility  Bed Mobility Overal bed mobility: Needs Assistance;+2 for physical assistance Bed Mobility: Supine to Sit     Supine to sit: Mod assist     General bed mobility comments: needed mod assist for elevation of trunk and for incr anterior lean as pt has posterior lean.  Transfers Overall transfer level: Needs assistance Equipment used: Rolling walker (2 wheeled) Transfers: Sit to/from [facility] Sit to Stand: Mod assist;+2 physical assistance Stand pivot transfers: Mod assist;+2 physical assistance       General transfer comment: Pt needed assist to power up.  ASsist and cues for hand placement as well.  Pt with some inattention to left side needing cues.  Pt able to stand and pivot to 3N1 and back to recliner.  Significant posterior lean needing a lot of assist for anterior lean.  Weight on pt's heels.  Pt again needing assist for placing left hand onto RW to hold it for pivots.  PT wiped pt as pt unable to stand and wipe stuff by herself.  Pt somewhat aware that her left hemibody is impaired.   Ambulation/Gait             General Gait Details: unable due to poor postural stability.   Stairs            Wheelchair Mobility    Modified Rankin (Stroke Patients Only) Modified Rankin (Stroke Patients Only) Pre-Morbid Rankin Score: Moderate disability Modified Rankin: Severe disability     Balance Overall balance assessment: Needs assistance Sitting-balance support: Bilateral upper extremity supported;Feet supported Sitting balance-Leahy Scale: Poor   Postural control: Posterior lean Standing balance support: Bilateral upper extremity supported;During functional activity Standing balance-Leahy Scale: Poor Standing balance comment: Cannot stand statically without assist.  Used RW for assist.                              Pertinent Vitals/Pain Pain Assessment: 0-10 Pain Score: 4  Pain Location: hip and right leg Pain Descriptors / Indicators: Aching Pain Intervention(s): Monitored during session;Limited activity within patient's tolerance  VSS    Home Living Family/patient expects to be discharged to:: Private residence Living Arrangements: Alone Available Help at Discharge: Family;Available PRN/intermittently (  nieces and nephews check on pt) Type of Home: House Home Access: Stairs to enter Entrance Stairs-Rails: Left Entrance Stairs-Number of Steps: 2 Home Layout: One level Home Equipment: Cane - single point;Walker - 2 wheels Additional Comments: Pt has been very active.      Prior Function Level of  Independence: Needs assistance   Gait / Transfers Assistance Needed: used cane just prior to admit with Modif Indenpendently  ADL's / Homemaking Assistance Needed: Sponge bathes independently, dresses self.        Hand Dominance   Dominant Hand: Right    Extremity/Trunk Assessment   Upper Extremity Assessment: Defer to OT evaluation           Lower Extremity Assessment: LLE deficits/detail   LLE Deficits / Details: grossly 3/5  Cervical / Trunk Assessment: Kyphotic  Communication   Communication: HOH  Cognition Arousal/Alertness: Awake/alert Behavior During Therapy: Flat affect Overall Cognitive Status: Difficult to assess Area of Impairment: Safety/judgement;Awareness;Following commands       Following Commands: Follows one step commands inconsistently Safety/Judgement: Decreased awareness of safety;Decreased awareness of deficits          General Comments      Exercises        Assessment/Plan    PT Assessment Patient needs continued PT services  PT Diagnosis Difficulty walking;Generalized weakness   PT Problem List Decreased activity tolerance;Decreased balance;Decreased mobility;Decreased coordination;Decreased strength;Decreased knowledge of use of DME;Decreased safety awareness;Decreased cognition  PT Treatment Interventions DME instruction;Gait training;Functional mobility training;Therapeutic activities;Therapeutic exercise;Balance training;Cognitive remediation;Patient/family education;Neuromuscular re-education   PT Goals (Current goals can be found in the Care Plan section) Acute Rehab PT Goals Patient Stated Goal: to go home after therapy PT Goal Formulation: With patient Time For Goal Achievement: 03/21/14 Potential to Achieve Goals: Good    Frequency Min 3X/week   Barriers to discharge Decreased caregiver support      Co-evaluation               End of Session Equipment Utilized During Treatment: Gait belt;Oxygen Activity  Tolerance: Patient limited by fatigue Patient left: in chair;with call bell/phone within reach;with chair alarm set Nurse Communication: Mobility status         Time: 1245-8099 PT Time Calculation (min) (ACUTE ONLY): 31 min   Charges:   PT Evaluation $Initial PT Evaluation Tier I: 1 Procedure PT Treatments $Therapeutic Activity: 8-22 mins $Self Care/Home Management: 8-22   PT G CodesDenice Paradise Mar 14, 2014, 3:57 PM [employer] [facility] [phone] (pager)

## 2014-03-08 DIAGNOSIS — I482 Chronic atrial fibrillation, unspecified: Secondary | ICD-10-CM | POA: Insufficient documentation

## 2014-03-08 LAB — BASIC METABOLIC PANEL
Anion gap: 11 (ref 5–15)
BUN: 11 mg/dL (ref 6–23)
CHLORIDE: 106 meq/L (ref 96–112)
CO2: 27 mEq/L (ref 19–32)
Calcium: 9.2 mg/dL (ref 8.4–10.5)
Creatinine, Ser: 0.65 mg/dL (ref 0.50–1.10)
GFR calc Af Amer: 85 mL/min — ABNORMAL LOW (ref >90)
GFR calc non Af Amer: 73 mL/min — ABNORMAL LOW (ref >90)
GLUCOSE: 129 mg/dL — AB (ref 70–99)
POTASSIUM: 4.1 meq/L (ref 3.7–5.3)
Sodium: 144 mEq/L (ref 137–147)

## 2014-03-08 LAB — CBC
HEMATOCRIT: 43.2 % (ref 36.0–46.0)
HEMOGLOBIN: 13.6 g/dL (ref 12.0–15.0)
MCH: 29.6 pg (ref 26.0–34.0)
MCHC: 31.5 g/dL (ref 30.0–36.0)
MCV: 94.1 fL (ref 78.0–100.0)
Platelets: 202 10*3/uL (ref 150–400)
RBC: 4.59 MIL/uL (ref 3.87–5.11)
RDW: 13.5 % (ref 11.5–15.5)
WBC: 5.4 10*3/uL (ref 4.0–10.5)

## 2014-03-08 MED ORDER — APIXABAN 2.5 MG PO TABS
2.5000 mg | ORAL_TABLET | ORAL | Status: AC
  Administered 2014-03-08: 2.5 mg via ORAL

## 2014-03-08 MED ORDER — ACETAMINOPHEN 325 MG PO TABS
325.0000 mg | ORAL_TABLET | ORAL | Status: DC | PRN
  Administered 2014-03-08: 325 mg via ORAL
  Filled 2014-03-08: qty 1

## 2014-03-08 MED ORDER — METOPROLOL TARTRATE 25 MG PO TABS
25.0000 mg | ORAL_TABLET | Freq: Two times a day (BID) | ORAL | Status: DC
  Administered 2014-03-08 – 2014-03-09 (×3): 25 mg via ORAL
  Filled 2014-03-08 (×3): qty 1

## 2014-03-08 MED ORDER — APIXABAN 2.5 MG PO TABS
2.5000 mg | ORAL_TABLET | Freq: Two times a day (BID) | ORAL | Status: DC
  Administered 2014-03-09: 2.5 mg via ORAL
  Filled 2014-03-08 (×2): qty 1

## 2014-03-08 NOTE — Clinical Social Work Placement (Addendum)
Clinical Social Work Department CLINICAL SOCIAL WORK PLACEMENT NOTE 03/08/2014  Patient:  Pamela [name], Pamela [name]  Account Number:  [id number] [place] date:  03/06/2014  Clinical Social Worker:  Glendon Axe, CLINICAL SOCIAL WORKER  Date/time:  03/08/2014 11:27 AM  Clinical Social Work is seeking post-discharge placement for this patient at the following level of care:   [place]   (*CSW will update this form in Epic as items are completed)   03/08/2014  Patient/family provided with [place] Department of Clinical Social Work's list of facilities offering this level of care within the geographic area requested by the patient (or if unable, by the patient's family).  03/08/2014  Patient/family informed of their freedom to choose among providers that offer the needed level of care, that participate in Medicare, Medicaid or managed care program needed by the patient, have an available bed and are willing to accept the patient.  03/08/2014  Patient/family informed of [facility]' ownership interest in [facility], as well as of the fact that they are under no obligation to receive care at this facility.  PASARR submitted to EDS on EXISTING   PASARR number received on EXISTING   FL2 transmitted to all facilities in geographic area requested by pt/family on  03/08/2014 FL2 transmitted to all facilities within larger geographic area on 03/08/2014  Patient informed that his/her managed care company has contracts with or will negotiate with  certain facilities, including the following:   YES     Patient/family informed of bed offers received:  03/08/2014 Patient chooses bed at [facility] and [place] recommends and patient chooses bed at    Patient to be transferred to [place]    on   Patient to be transferred to facility by  Patient and family notified of transfer on  Name of family member notified:    The following physician request  were entered in Epic:   Additional Comments:  Glendon Axe, MSW, [place] [phone] 03/08/2014 11:28 AM

## 2014-03-08 NOTE — Evaluation (Signed)
Speech Language Pathology Evaluation Patient Details Name: Pamela [name] MRN: [id number] DOB: 07/22/1916 Today's Date: 03/08/2014 Time: 1017-5102 SLP Time Calculation (min) (ACUTE ONLY): 22 min  Problem List:  Patient Active Problem List   Diagnosis Date Noted  . Chronic a-fib   . Weakness   . Stroke 03/06/2014  . CVA (cerebral infarction) 03/06/2014  . Medicare annual wellness visit, subsequent 01/11/2014  . Tinnitus of both ears 04/16/2013  . Erythema of foot 08/21/2012  . Foot ulceration 02/09/2012  . Hypokalemia 12/12/2011  . Fibula fracture 12/12/2011  . Pyelonephritis 06/11/2011  . Weakness generalized 05/01/2011  . Gingivitis 11/30/2010  . BRADYCARDIA 05/22/2010  . WRIST PAIN, RIGHT 05/19/2010  . UNSPECIFIED PERIPHERAL VASCULAR DISEASE 12/28/2009  . WOUND, LEG 12/28/2009  . ALLERGIC RHINITIS 07/11/2009  . Hyperlipidemia 03/31/2009  . Essential hypertension 03/31/2009  . PAROXYSMAL ATRIAL FIBRILLATION 03/31/2009  . HIATAL HERNIA 03/31/2009  . CONSTIPATION 03/31/2009  . FIBROMYALGIA 03/31/2009  . CEREBROVASCULAR ACCIDENT, HX OF 03/31/2009  . UTI'S, HX OF 03/31/2009   Past Medical History:  Past Medical History  Diagnosis Date  . Hyperlipidemia   . Hypertension   . Fibromyalgia   . Thyroid disease     hyperparathyroid  . PAD (peripheral artery disease)   . TIA (transient ischemic attack) ~ 2009    "only 1 that I know of" (12/13/2011)  . Atrial fibrillation     Not on Coumadin. Noted during 02/2009 admission.   . Skin cancer     Unknown types  . Schatzki's ring     Non obstructing ring on EGD 02/2009   . Pancreatic mass 02/2009    Abnormal CT abdomen, with cystic area in head and circumferential thickening of midbody of stomach  . Fibula fracture 12/11/2011    left  . Stroke 2006    "drags right leg very slightly" (12/13/2011)  . GERD (gastroesophageal reflux disease)   . H/O hiatal hernia   . Hard of hearing     "totally deaf in the right; 50% is gone  in the left" (12/13/2011)   Past Surgical History:  Past Surgical History  Procedure Laterality Date  . Appendectomy  1940  . Parathyroidectomy  1980  . Vaginal hysterectomy  1970   HPI:  80 y.o. female with prior h/o CVA 10 to 15 years ago, hypertension, paroxysmal afib, fibromyalgia, Schatzki's ring, pancreatic mass, HOH admitted with confusion and a fall earlier this am. MRI of the brain showed acute right cerebral hemisphere watershed infarcts. Pt. lives alone with help   Assessment / Plan / Recommendation Clinical Impression  Pt demonstrated mild cognitive impairments primarily in the areas of working memory, anticipatory awareness and problem solving.  No family present and baseline cognitive status not confirmed although suspect memory challenges. Verbal expression was functional with instances of decreased semantics during explanations of thoughts. She required semantic and choice cues for memory subtest on the Cognistat.  No ST recommended in acute care, pt would benefit from a  screen at SNF for cognitive needs in new environment.      SLP Assessment  All further Speech Lanaguage Pathology  needs can be addressed in the next venue of care    Follow Up Recommendations  Skilled Nursing facility    Frequency and Duration        Pertinent Vitals/Pain Pain Assessment: No/denies pain        SLP Evaluation Prior Functioning  Cognitive/Linguistic Baseline: Information not available (suspect able to function w/ help, lives along,no family  pres) Type of Home: House  Lives With: Alone Available Help at Discharge: Family;Available PRN/intermittently   Cognition  Overall Cognitive Status: No family/caregiver present to determine baseline cognitive functioning (suspect memory deficits) Arousal/Alertness: Awake/alert Orientation Level: Oriented to person;Oriented to place;Oriented to situation;Disoriented to time Attention: Sustained Sustained Attention: Appears intact Memory:  Impaired Memory Impairment: Retrieval deficit;Decreased recall of new information Awareness: Impaired Awareness Impairment: Intellectual impairment Problem Solving: Impaired Problem Solving Impairment: Functional basic;Verbal basic Safety/Judgment: Appears intact    Comprehension  Auditory Comprehension Overall Auditory Comprehension: Appears within functional limits for tasks assessed Visual Recognition/Discrimination Discrimination: Not tested Reading Comprehension Reading Status: Within funtional limits    Expression Expression Primary Mode of Expression: Verbal Verbal Expression Overall Verbal Expression: Appears within functional limits for tasks assessed (mildly decreased semantics) Initiation: No impairment Level of Generative/Spontaneous Verbalization: Conversation Repetition:  (NT) Naming: Not tested Pragmatics: No impairment Written Expression Dominant Hand: Right Written Expression: Not tested   Oral / Motor Oral Motor/Sensory Function Overall Oral Motor/Sensory Function: Appears within functional limits for tasks assessed Motor Speech Overall Motor Speech: Appears within functional limits for tasks assessed Motor Planning: Witnin functional limits   GO     Houston Siren 03/08/2014, 1:27 PM   Orbie Pyo Keyundra Fant M.Ed [employer] [phone]

## 2014-03-08 NOTE — Clinical Social Work Psychosocial (Signed)
Clinical Social Work Department BRIEF PSYCHOSOCIAL ASSESSMENT 03/08/2014  Patient:  Pamela [name], Pamela [name]     Account Number:  [id number]     Admit date:  03/06/2014  Clinical Social Worker:  Glendon Axe, CLINICAL SOCIAL WORKER  Date/Time:  03/08/2014 11:07 AM  Referred by:  Physician  Date Referred:  03/08/2014 Referred for  SNF Placement   Other Referral:   Interview type:  Other - See comment Other interview type:   CSW met with patient at bedside and spoke with pt's niece, Marcie Bal via telephone (with pt's permission).    PSYCHOSOCIAL DATA Living Status:  ALONE Admitted from facility:   Level of care:   Primary support name:  Edwin Dada (niece) Primary support relationship to patient:  Family  Degree of support available:   Strong    CURRENT CONCERNS Current Concerns  Post-Acute Placement   Other Concerns:    SOCIAL WORK ASSESSMENT / PLAN Clinical Social Worker met with patient at bedside in reference to post-acute placement for SNF. CSW explained SNF process and reviewed SNF list with patient. Patient reported she was a resident of [place] in the past but does not wish to return due to location. Patient stated she would like placement closer to [facility] area and requested placement at [facility] and [place]. CSW has faxed patient's clinical documents to both [place]. CSW also spoke with pt's niece,Janet via telephone who reported she is the care giver for the pt as well as her own mother. Pt's niece reported she is challenged with caring for both pt and her mother as pt's sister is sick as well. Pt's niece also asked several questions in reference to pt's medically condition. CSW advised pt's niece to meet with pt's MD/RN during visitation to receive medical updates. CSW will continue to follow pt and pt's family for continued support and to facilitate pt's discharge needs once medically stable.   Assessment/plan status:  Psychosocial  Support/Ongoing Assessment of Needs Other assessment/ plan:   Information/referral to community resources:   SNF information/list provided.    PATIENT'S/FAMILY'S RESPONSE TO PLAN OF CARE: Pt sitting at bedside, alert and oriented. Pt stated she was a resident of [place] in the past but currently desires placement at [facility]. Pt smiling during interview and advised CSW to contact niece, Marcie Bal. Pt and pt's niece pleasant, appreciated social work intervention and agreeable to SNF.     Glendon Axe, MSW, LCSWA [phone] 03/08/2014 11:26 AM

## 2014-03-08 NOTE — Progress Notes (Signed)
recd message from telemetry monitoring that pt has been in SR with PACs all night and this morning. Nurse notified MD, Dr. Wyline Copas via text msg. Will continue to monitor pt closely.   Angeline Slim I 03/08/2014 12:23 PM

## 2014-03-08 NOTE — Progress Notes (Signed)
STROKE TEAM PROGRESS NOTE   HISTORY Pamela [name] is an 80 y.o. female history of hypertension, hyperlipidemia, peripheral artery disease, stroke, and atrial fibrillation not on anticoagulation, presenting with new onset of left sided weakness. Initial symptoms began several days ago. However, she started leaning towards the left side yesterday 03/05/2014 with more obvious weakness. He's been taking aspirin daily. MRI of her brain showed multiple areas of watershed infarct involving the right hemisphere. NIH stroke score was 0. LSN: Unclear. tPA Given was not given as unclear when last seen well.    SUBJECTIVE (INTERVAL HISTORY) No complaints. Up in chair. Reports she has a bad memory. Is agreeable to SNF placement; her niece is helping her arrange.   OBJECTIVE Temp:  [97.6 F (36.4 C)-98.8 F (37.1 C)] 97.9 F (36.6 C) (12/14 1038) Pulse Rate:  [69-99] 79 (12/14 1038) Cardiac Rhythm:  [-]  Resp:  [16-20] 18 (12/14 1038) BP: (135-164)/(48-88) 135/48 mmHg (12/14 1038) SpO2:  [95 %-100 %] 98 % (12/14 1038)  No results for input(s): GLUCAP in the last 168 hours.  Recent Labs Lab 03/05/14 1905 03/06/14 1300 03/08/14 0825  NA 141 139 144  K 4.2 4.5 4.1  CL 101 99 106  CO2 27 28 27   GLUCOSE 101* 93 129*  BUN 29* 21 11  CREATININE 0.79 0.72 0.65  CALCIUM 10.0 9.6 9.2    Recent Labs Lab 03/05/14 1905 03/06/14 1300  AST 29 32  ALT 18 19  ALKPHOS 79 80  BILITOT 0.4 0.5  PROT 7.1 6.9  ALBUMIN 4.1 3.9    Recent Labs Lab 03/05/14 1905 03/06/14 1300 03/08/14 0825  WBC 6.1 5.8 5.4  NEUTROABS  --  4.4  --   HGB 13.1 13.6 13.6  HCT 39.8 41.9 43.2  MCV 93.9 96.5 94.1  PLT 206 188 202   No results for input(s): CKTOTAL, CKMB, CKMBINDEX, TROPONINI in the last 168 hours.  Recent Labs  03/06/14 1300  LABPROT 13.5  INR 1.02    Recent Labs  03/05/14 2117 03/06/14 1326  COLORURINE YELLOW YELLOW  LABSPEC 1.018 1.005  PHURINE 6.0 6.5  GLUCOSEU NEGATIVE NEGATIVE   HGBUR NEGATIVE NEGATIVE  BILIRUBINUR NEGATIVE NEGATIVE  KETONESUR 15* 15*  PROTEINUR NEGATIVE NEGATIVE  UROBILINOGEN 0.2 0.2  NITRITE NEGATIVE NEGATIVE  LEUKOCYTESUR MODERATE* NEGATIVE       Component Value Date/Time   CHOL 209* 03/07/2014 0540   TRIG 195* 03/07/2014 0540   HDL 41 03/07/2014 0540   CHOLHDL 5.1 03/07/2014 0540   VLDL 39 03/07/2014 0540   LDLCALC 129* 03/07/2014 0540   Lab Results  Component Value Date   HGBA1C 5.5 03/07/2014   No results found for: LABOPIA, COCAINSCRNUR, LABBENZ, AMPHETMU, THCU, LABBARB   Recent Labs Lab 03/06/14 1300  ETH <11   Dg Chest Port 1 View 03/05/2014    No active disease.    Ct Head Wo Contrast 03/05/2014    No acute intracranial process.  Involutional changes. Moderate to severe white matter changes suggest chronic small vessel ischemic disease. Remote LEFT thalamus lacunar infarct.     Mr Brain Wo Contrast 03/06/2014    1. Acute right cerebral hemisphere watershed infarcts.  2. Abnormal appearance of the distal right internal carotid artery flow void, which may reflect high-grade stenosis or occlusion in the neck.  3. Moderate chronic small vessel ischemic disease and chronic lacunar infarcts as above.     Mr Jodene Nam Head/brain Wo Cm 03/06/2014   1. No evidence of major intracranial arterial  occlusion.  2. Abnormal appearance of the visualized portions of both distal cervical internal carotid arteries with findings suggestive of fibromuscular dysplasia. The distal right ICA is patent but with evidence of decreased flow compared to the left.  3. Intracranial atherosclerosis resulting in moderate right supraclinoid ICA stenosis, moderate to severe basilar stenosis, moderate to severe right P2 stenosis, and moderate branch vessel irregularity.     Carotid Doppler  There is 1-39% bilateral ICA stenosis. Vertebral artery flow is antegrade.    2D Echocardiogram  EF 55-60% with no source of embolus.    PHYSICAL EXAM Frail  elderly caucasian lady not in distress.  Afebrile. Head is nontraumatic. Neck is supple without bruit. Hearing is poor Cardiac exam no murmur or gallop. Lungs are clear to auscultation. Distal pulses are well felt. Neurological Exam : Awake alert oriented x 2.diminished attention, registration and recall .Follows one and occasional two-step commands normal speech and language. Mild left lower face asymmetry. Tongue midline. No drift. Mild diminished fine finger movements on left. Orbits right over left upper extremity. Mild left grip weak. Normal sensation . Normal coordination.   ASSESSMENT/PLAN Ms. Pamela [name] is a 80 y.o. female with history of hypertension, hyperlipidemia, peripheral artery disease, previous stroke, and atrial fibrillation not anticoagulated presenting with new onset of left-sided weakness. She did not receive IV t-PA as he time of onset was unclear.  Stroke:  Non-dominant right cerebral watershed infarcts, embolic secondary to atrial fibrillation   Resultant  Mild left sided hemiparesis  MRI  Acute right cerebral hemisphere watershed infarcts.   MRA  as above  Carotid Doppler - 1-39% ICA stenosis. Vertebral artery flow is antegrade.  2D Echo - EF 60-65%. No cardiac source of emboli identified  HgbA1c - 5.8  Lovenox for VTE prophylaxis  Diet Heart heart healthy diet with thin liquids.  aspirin 325 mg orally every day prior to admission, change to  eliquis (apixaban) 2.5 mg bid  Patient counseled to be compliant with her antithrombotic medications  Ongoing aggressive stroke risk factor management  Therapy recommendations:  SNF  Disposition:  SNF ( lived alone prior to admission )  Hypertension  Home meds:   Norvasc 5 mg daily, Cozaar 100 mg daily, metoprolol 25 mg at bedtime.   Stable  Hyperlipidemia  Home meds:   Zetia 10 mg daily - resumed in hospital  LDL 129, goal < 70 - History of statin intolerance  On zetia. Will not recommend/start statin  given advanced age and hx of statin intolerance  Other Stroke Risk Factors  Advanced age  Hx stroke/TIA  Other Active Problems  Hyperlipidemia with history of statin intolerance  Great niece who reports the patient is very compliant with medications.  Possible carotid artery disease.  Other Pertinent History  Hard of hearing  NO FURTHER STROKE WORKUP INDICATED  Patient has a 10-15% risk of having another stroke over the next year, the highest risk is within 2 weeks of the most recent stroke/TIA (risk of having a stroke following a stroke or TIA is the same).  Ongoing risk factor control by Primary Care Physician  Stroke Service will sign off. Please call should any needs arise.  Follow up with Dr. Antony Contras at [facility] in 1 month(s), order placed.   Hospital day # [place] for Pager information 03/08/2014 10:55 AM  I have personally examined this patient, reviewed notes, independently viewed imaging studies, participated in medical decision making  and plan of care. I have made any additions or clarifications directly to the above note. Agree with note above.   Antony Contras, MD Medical Director [facility] Pager: [phone] 03/08/2014 8:00 PM  To contact Stroke Continuity provider, please refer to [email or web address] After hours, contact General Neurology

## 2014-03-08 NOTE — Progress Notes (Signed)
UR COMPLETED  

## 2014-03-08 NOTE — Clinical Social Work Note (Signed)
Patient and pt's niece, Pamela [name] chooses bed at [facility] and [place]. Pt's niece will meet with admission director, Angela Nevin of [place] to complete admissions paperwork today around 3:30PM. Pt's niece would like pt discharged tomorrow morning if medically stable.   CSW will continue to follow for continued support and to facilitate pt's discharge needs once medically stable.   Glendon Axe, MSW, LCSWA [phone] 03/08/2014 1:42 PM

## 2014-03-08 NOTE — Progress Notes (Signed)
Physical Therapy Treatment Patient Details Name: Pamela [name] MRN: [id number] DOB: 1916/10/22 Today's Date: 03/08/2014    History of Present Illness Pamela [name] is an 80 y.o. female history of hypertension, hyperlipidemia, peripheral artery disease, stroke, and atrial fibrillation not on anticoagulation, presenting with new onset of left sided weakness. Initial symptoms began several days ago. However, she started leaning towards the left side yesterday with more obvious weakness. He's been taking aspirin daily. MRI of her brain showed multiple areas of watershed infarct involving the right hemisphere. NIH stroke score was 0.     PT Comments    Patient with L inattention to UE this session requiring multiple tactile and VCs. Patient with less posterior lean this session and was able to ambulation.Still very weak and delayed with cueings. Continue to recommend SNF for ongoing Physical Therapy.   Patient stated that she was interested in [facility] for rehab as she has had friends that have went there  Follow Up Recommendations  SNF;Supervision/Assistance - 24 hour (wants [facility] per her request today)     Equipment Recommendations  None recommended by PT    Recommendations for Other Services       Precautions / Restrictions Precautions Precautions: Fall    Mobility  Bed Mobility                  Transfers Overall transfer level: Needs assistance Equipment used: Rolling walker (2 wheeled)   Sit to Stand: Min assist;+2 safety/equipment         General transfer comment: Min +2 (A) for boost to stand and to maintain balance upon standing due to posterior lean. Verbal cues for R hand placement and tactile cues for L hand placement due to L inattention.  Cues for positioning prior to sit and to control descent onto the recliner  Ambulation/Gait Ambulation/Gait assistance: Min assist;+2 safety/equipment Ambulation Distance (Feet): 50 Feet (x2) Assistive  device: Rolling walker (2 wheeled) Gait Pattern/deviations: Step-to pattern;Decreased step length - right;Decreased step length - left   Gait velocity interpretation: Below normal speed for age/gender General Gait Details: Cues and A to manage RW, erect posture and for stability.    Stairs            Wheelchair Mobility    Modified Rankin (Stroke Patients Only) Modified Rankin (Stroke Patients Only) Pre-Morbid Rankin Score: Moderate disability Modified Rankin: Moderately severe disability     Balance     Sitting balance-Leahy Scale: Fair       Standing balance-Leahy Scale: Poor Standing balance comment: requires min A due to posterior sway                    Cognition Arousal/Alertness: Awake/alert Behavior During Therapy: Flat affect Overall Cognitive Status: No family/caregiver present to determine baseline cognitive functioning Area of Impairment: Attention;Following commands;Safety/judgement;Awareness;Problem solving   Current Attention Level: Sustained   Following Commands: Follows one step commands inconsistently Safety/Judgement: Decreased awareness of safety;Decreased awareness of deficits Awareness: Emergent Problem Solving: Slow processing;Difficulty sequencing;Requires verbal cues;Requires tactile cues      Exercises      General Comments        Pertinent Vitals/Pain Pain Assessment: No/denies pain    Home Living Family/patient expects to be discharged to:: Skilled nursing facility Living Arrangements: Alone                  Prior Function            PT Goals (current goals can  now be found in the care plan section) Progress towards PT goals: Progressing toward goals    Frequency  Min 3X/week    PT Plan Current plan remains appropriate    Co-evaluation             End of Session Equipment Utilized During Treatment: Gait belt;Oxygen Activity Tolerance: Patient tolerated treatment well Patient left: in  chair;with call bell/phone within reach;with chair alarm set     Time: 8592-9244 PT Time Calculation (min) (ACUTE ONLY): 27 min  Charges:  $Gait Training: 8-22 mins $Therapeutic Activity: 8-22 mins                    G Codes:      Jacqualyn Posey 03/08/2014, 2:11 PM 03/08/2014 Jacqualyn Posey PTA [phone] pager [phone] office

## 2014-03-08 NOTE — Progress Notes (Signed)
Occupational Therapy Evaluation Patient Details Name: Pamela [name] MRN: [id number] DOB: 20-Sep-1916 Today's Date: 03/08/2014    History of Present Illness Pamela [name] is an 80 y.o. female history of hypertension, hyperlipidemia, peripheral artery disease, stroke, and atrial fibrillation not on anticoagulation, presenting with new onset of left sided weakness. Initial symptoms began several days ago. However, she started leaning towards the left side yesterday with more obvious weakness. She has been taking aspirin daily. MRI of her brain showed multiple areas of watershed infarct involving the right hemisphere. NIH stroke score was 0.    Clinical Impression   Pt admitted with L side weakness. Pt currently with functional limitiations due to the deficits listed below (see OT problem list). Pt had a posterior lean during static standing tasks today and required min assist to maintain balance. Pt also required max verbal cues for safe RW use as pt tends to push RW too far ahead and loses her balance. Pt will benefit from skilled OT to increase their independence and safety with adls and balance. Recommending SNF for continued therapy services to increase pt's balance and safety. Will continue to follow acutely.    Follow Up Recommendations  SNF;Supervision/Assistance - 24 hour    Equipment Recommendations  Other (comment) (defer)    Recommendations for Other Services       Precautions / Restrictions Precautions Precautions: Fall Restrictions Weight Bearing Restrictions: No      Mobility Bed Mobility Overal bed mobility: Needs Assistance Bed Mobility: Supine to Sit     Supine to sit: Supervision     General bed mobility comments: HOB flat; use of bedrails. Verbal cues to scoot hips to EOB.  Transfers Overall transfer level: Needs assistance Equipment used: Rolling walker (2 wheeled) Transfers: Sit to/from Stand Sit to Stand: Min assist;+2 safety/equipment          General transfer comment: Min +2 (A) for boost to stand and to maintain balance upon standing due to posterior lean. Verbal cues for R hand placement and tactile cues for L hand placement due to L inattention. Pt stood x2 from EOB and BSC. Second time from [facility], pt only required min (A). Max verbal cues for safe RW use as pt pushes RW too far ahead. Returning to recliner, pt began to lose balance psoteriorly and impulsively sat down. Discussed with pt importance of feeling surfaces with back of legs and reaching with one hand to facilitate slow descend. Pt agreeable and appreciated the instruction.     Balance Overall balance assessment: Needs assistance Sitting-balance support: Bilateral upper extremity supported;Feet supported Sitting balance-Leahy Scale: Fair   Postural control: Posterior lean Standing balance support: Single extremity supported;During functional activity Standing balance-Leahy Scale: Poor Standing balance comment: Requires at least single extremity support and min (A) for static standing tasks.                             ADL Overall ADL's : Needs assistance/impaired     Grooming: Wash/dry hands;Oral care;Minimal assistance;Standing Grooming Details (indicate cue type and reason): Pt with posterior lean even with single extremity support on sink. Min (A) to maintain balance.                 Toilet Transfer: Minimal assistance;+2 for safety/equipment;Cueing for safety;Cueing for sequencing;Ambulation;BSC;RW   Toileting- Clothing Manipulation and Hygiene: Maximal assistance;Cueing for safety;Cueing for sequencing;Sit to/from stand Toileting - Clothing Manipulation Details (indicate cue type and reason): Max (A)  for clothing manipulation and peri care due to pt needing bil UE support on RW to maintain balance.     Functional mobility during ADLs: Minimal assistance;+2 for safety/equipment;Rolling walker;Cueing for safety General ADL Comments: Pt  required min (A) to maintain balance throughout session due to posterior lean and weight in heels. Pt demonstrating L inattention and required mod verbal cues to attend to LUE during grooming tasks at sink. Pt required max verbal cues and physical (A) for safe RW use. Pt pushing RW too far ahead and needed cues to keep hips in line with RW.        Vision                 Additional Comments: L inattention noted and challenged during self-care tasks at sink   Perception     Praxis      Pertinent Vitals/Pain Pain Assessment: No/denies pain     Hand Dominance Right   Extremity/Trunk Assessment Upper Extremity Assessment Upper Extremity Assessment: Generalized weakness   Lower Extremity Assessment Lower Extremity Assessment: Defer to PT evaluation   Cervical / Trunk Assessment Cervical / Trunk Assessment: Kyphotic   Communication Communication Communication: HOH   Cognition Arousal/Alertness: Awake/alert Behavior During Therapy: Flat affect Overall Cognitive Status: No family/caregiver present to determine baseline cognitive functioning Area of Impairment: Attention;Following commands;Safety/judgement;Awareness;Problem solving   Current Attention Level: Sustained   Following Commands: Follows one step commands inconsistently Safety/Judgement: Decreased awareness of safety;Decreased awareness of deficits Awareness: Emergent Problem Solving: Slow processing;Difficulty sequencing;Requires verbal cues;Requires tactile cues     General Comments       Exercises       Shoulder Instructions      Home Living Family/patient expects to be discharged to:: Private residence Living Arrangements: Alone Available Help at Discharge: Family;Available PRN/intermittently Type of Home: House Home Access: Stairs to enter [employer] of Steps: 2 Entrance Stairs-Rails: Left Home Layout: One level     Bathroom Shower/Tub: Tub/shower unit (hasnt used in 1 year due to  fall risk)         Home Equipment: Cane - single point;Walker - 2 wheels   Additional Comments: Niece is very active in care per patient      Prior Functioning/Environment Level of Independence: Needs assistance  Gait / Transfers Assistance Needed: uses a cane ADL's / Homemaking Assistance Needed: Sponge bathes independently, dresses self.        OT Diagnosis: Generalized weakness;Cognitive deficits   OT Problem List: Decreased strength;Decreased range of motion;Decreased activity tolerance;Impaired balance (sitting and/or standing);Decreased coordination;Decreased cognition;Decreased safety awareness;Decreased knowledge of use of DME or AE;Decreased knowledge of precautions   OT Treatment/Interventions: Therapeutic exercise;Self-care/ADL training;DME and/or AE instruction;Therapeutic activities;Cognitive remediation/compensation;Patient/family education;Balance training    OT Goals(Current goals can be found in the care plan section) Acute Rehab OT Goals Patient Stated Goal: to get stronger and back to independence OT Goal Formulation: With patient Time For Goal Achievement: 03/22/14 Potential to Achieve Goals: Good  OT Frequency: Min 3X/week   Barriers to D/C:            Co-evaluation              End of Session Equipment Utilized During Treatment: Gait belt;Rolling walker Nurse Communication: Mobility status;Precautions  Activity Tolerance: Patient tolerated treatment well Patient left: in chair;with call bell/phone within reach;with chair alarm set   Time: [phone] OT Time Calculation (min): 32 min Charges:    G-Codes:    Redmond Baseman 2014-03-25, 10:16 AM

## 2014-03-08 NOTE — Progress Notes (Signed)
[facility] PROGRESS NOTE  Pamela [name] DGL:[id number] DOB: 07/06/1916 DOA: 03/06/2014 PCP: Arnette Norris, MD  Assessment/Plan: 1. Acute R cerebral hemisphere watershed infarcts 1. Neurology following 2. 2d echo with grade 2 diastolic dysfunction, EF 51-88% 3. Carotid dopplers without stenosis 4. PT/OT with recs for SNF 5. On Plavix 2. HTN 1. Allowing permissive HTN 2. Stable 3. HLD 1. LDL 129 2. Pt with documented allergy to statin 3. On Zetia 4. Afib 1. Rate controlled 5. DVT prophylaxis 1. Lovenox subQ  Code Status: Full Family Communication: Pt in room Disposition Plan: Pending SNF   Consultants:  Neurology  Procedures:    Antibiotics:   (indicate start date, and stop date if known)  HPI/Subjective: Complains of mild headache. No acute issues overnight  Objective: Filed Vitals:   03/07/14 1748 03/07/14 2136 03/08/14 0100 03/08/14 0552  BP: 145/71 140/59 154/88 153/66  Pulse: 99 69 74 72  Temp: 98.3 F (36.8 C) 98.7 F (37.1 C) 97.6 F (36.4 C) 98.7 F (37.1 C)  TempSrc: Oral Oral Oral Oral  Resp: 20 18 18 18   Height:      Weight:      SpO2: 100% 99% 95% 97%    Intake/Output Summary (Last 24 hours) at 03/08/14 0813 Last data filed at 03/07/14 1746  Gross per 24 hour  Intake    120 ml  Output      0 ml  Net    120 ml   Filed Weights   03/06/14 1705  Weight: 58.65 kg (129 lb 4.8 oz)    Exam:   General:  Awake, in nad  Cardiovascular: regular, s1, s2  Respiratory: normal resp effort, no wheezing  Abdomen: soft,nondistended  Musculoskeletal: perfused,no clubbing   Data Reviewed: Basic Metabolic Panel:  Recent Labs Lab 03/05/14 1905 03/06/14 1300  NA 141 139  K 4.2 4.5  CL 101 99  CO2 27 28  GLUCOSE 101* 93  BUN 29* 21  CREATININE 0.79 0.72  CALCIUM 10.0 9.6   Liver Function Tests:  Recent Labs Lab 03/05/14 1905 03/06/14 1300  AST 29 32  ALT 18 19  ALKPHOS 79 80  BILITOT 0.4 0.5  PROT 7.1 6.9  ALBUMIN  4.1 3.9   No results for input(s): LIPASE, AMYLASE in the last 168 hours. No results for input(s): AMMONIA in the last 168 hours. CBC:  Recent Labs Lab 03/05/14 1905 03/06/14 1300  WBC 6.1 5.8  NEUTROABS  --  4.4  HGB 13.1 13.6  HCT 39.8 41.9  MCV 93.9 96.5  PLT 206 188   Cardiac Enzymes: No results for input(s): CKTOTAL, CKMB, CKMBINDEX, TROPONINI in the last 168 hours. BNP (last 3 results) No results for input(s): PROBNP in the last 8760 hours. CBG: No results for input(s): GLUCAP in the last 168 hours.  Recent Results (from the past 240 hour(s))  Urine culture     Status: None   Collection Time: 03/05/14  9:17 PM  Result Value Ref Range Status   Specimen Description URINE, CLEAN CATCH  Final   Special Requests ADDED [id number]  Final   Culture  Setup Time   Final    03/06/2014 06:41 Performed at [facility]   Final    >=100,000 COLONIES/ML Performed at [facility]    Culture   Final    Multiple bacterial morphotypes present, none predominant. Suggest appropriate recollection if clinically indicated. Performed at [facility]    Report Status 03/07/2014  FINAL  Final     Studies: Mr Brain Wo Contrast  03/06/2014   CLINICAL DATA:  Weakness and intermittent confusion.  Prior strokes.  EXAM: MRI HEAD WITHOUT CONTRAST  TECHNIQUE: Multiplanar, multiecho pulse sequences of the brain and surrounding structures were obtained without intravenous contrast.  COMPARISON:  Head CT 02/23/2014  FINDINGS: There are small, patchy areas of acute infarction in the right cerebral hemisphere involving the centrum semiovale and posterior parietal lobes in a watershed distribution. No left cerebral hemispheric acute infarct is seen. There is no evidence of intracranial hemorrhage, mass, midline shift, or extra-axial fluid collection. There is moderate cerebral atrophy. Patchy T2 hyperintensities in the subcortical and deep cerebral white matter are  nonspecific but compatible with moderate chronic small vessel ischemic disease. Chronic lacunar infarcts are noted in the left thalamus, bilateral basal ganglia, and right corona radiata.  Prior bilateral cataract extraction is noted. Paranasal sinuses are clear. Small left mastoid effusion is noted. Visualized distal right internal carotid artery flow void in the neck is abnormal with the intracranial right ICA flow void appearing smaller in caliber diffusely compared to the left. Other major intracranial vascular flow voids are preserved.  IMPRESSION: 1. Acute right cerebral hemisphere watershed infarcts. 2. Abnormal appearance of the distal right internal carotid artery flow void, which may reflect high-grade stenosis or occlusion in the neck. 3. Moderate chronic small vessel ischemic disease and chronic lacunar infarcts as above.   Electronically Signed   By: Logan Bores   On: 03/06/2014 14:48   Mr Jodene Nam Head/brain Wo Cm  03/06/2014   CLINICAL DATA:  Weakness and intermittent confusion.  Prior strokes.  EXAM: MRA HEAD WITHOUT CONTRAST  TECHNIQUE: Angiographic images of the Circle of Willis were obtained using MRA technique without intravenous contrast.  COMPARISON:  None.  FINDINGS: The visualized distal vertebral arteries are patent with the left being slightly larger than the right. Left PICA origin is patent. Right PICA is not identified. Basilar artery is tortuous with moderate to severe stenosis in its midportion. Right AICA and bilateral SCA origins are patent. There is a fetal type origin of the left PCA. There is mild to moderate irregularity of the PCAs diffusely with mild narrowing of both P2 segments. A moderate to severe focal stenosis is noted more distally in the right P2 segment.  The visualized distal portions of the cervical internal carotid arteries demonstrate areas of abnormal narrowing and dilatation bilaterally with regions of short segment dissection and pseudoaneurysm formation  questioned. The intracranial internal carotid arteries are patent to the carotid termini bilaterally, however the intracranial right ICA is mildly smaller in caliber diffusely compared to the left with decreased intensity of flow related enhancement. There is moderate stenosis of the distal right supraclinoid ICA.  The right M1 segment is patent without stenosis. The left M1 segment is patent with irregular, mild narrowing. There is mild to moderate bilateral MCA branch vessel irregularity. The A1 segments are patent with the left being mildly dominant and with evidence of mild proximal A1 stenosis on the right. Anterior communicating artery is patent. There are multiple foci of mild to moderate stenosis involving A2 and more distal branches.  IMPRESSION: 1. No evidence of major intracranial arterial occlusion. 2. Abnormal appearance of the visualized portions of both distal cervical internal carotid arteries with findings suggestive of fibromuscular dysplasia. The distal right ICA is patent but with evidence of decreased flow compared to the left. 3. Intracranial atherosclerosis resulting in moderate right supraclinoid ICA stenosis,  moderate to severe basilar stenosis, moderate to severe right P2 stenosis, and moderate branch vessel irregularity.   Electronically Signed   By: Logan Bores   On: 03/06/2014 18:32    Scheduled Meds: . calcium carbonate  1,000 mg of elemental calcium Oral Q breakfast  . clopidogrel  75 mg Oral Daily  . enoxaparin (LOVENOX) injection  30 mg Subcutaneous Q24H  . ezetimibe  10 mg Oral Daily   Continuous Infusions: . sodium chloride    . sodium chloride    . sodium chloride 100 mL/hr (03/06/14 1746)    Active Problems:   Stroke   CVA (cerebral infarction)   Weakness    Time spent: 27min  Andilynn Delavega, [facility] Pager [phone]. If 7PM-7AM, please contact night-coverage at [email or web address], password [facility] 03/08/2014, 8:13 AM  LOS: 2 days

## 2014-03-08 NOTE — Progress Notes (Signed)
[facility] PROGRESS NOTE  Pamela [name] NTI:[id number] DOB: 1916-10-06 DOA: 03/06/2014 PCP: Arnette Norris, MD  Assessment/Plan: 1. Acute R cerebral hemisphere watershed infarcts 1. Neurology following 2. 2d echo with grade 2 diastolic dysfunction, EF 86-76% 3. Carotid dopplers without stenosis 4. PT/OT with recs for SNF - SW consulted pending 5. On Plavix 2. HTN 1. Allowing permissive HTN 2. Stable 3. Will add back metorolol given hx a fib and reports of PVC's overnight 3. HLD 1. LDL 129 2. Pt with documented allergy to statin 3. On Zetia 4. Afib 1. Rate controlled 5. DVT prophylaxis 1. Lovenox subQ  Code Status: Full Family Communication: Pt in room Disposition Plan: Pending SNF   Consultants:  Neurology  Procedures:    Antibiotics:    HPI/Subjective: No acute events noted overnight.  Objective: Filed Vitals:   03/07/14 2136 03/08/14 0100 03/08/14 0552 03/08/14 1038  BP: 140/59 154/88 153/66 135/48  Pulse: 69 74 72 79  Temp: 98.7 F (37.1 C) 97.6 F (36.4 C) 98.7 F (37.1 C) 97.9 F (36.6 C)  TempSrc: Oral Oral Oral Oral  Resp: 18 18 18 18   Height:      Weight:      SpO2: 99% 95% 97% 98%    Intake/Output Summary (Last 24 hours) at 03/08/14 1238 Last data filed at 03/07/14 1746  Gross per 24 hour  Intake    120 ml  Output      0 ml  Net    120 ml   Filed Weights   03/06/14 1705  Weight: 58.65 kg (129 lb 4.8 oz)    Exam:   General:  Awake, in nad  Cardiovascular: regular, s1, s2  Respiratory: normal resp effort, no wheezing  Abdomen: soft,nondistended  Musculoskeletal: perfused,no clubbing   Data Reviewed: Basic Metabolic Panel:  Recent Labs Lab 03/05/14 1905 03/06/14 1300 03/08/14 0825  NA 141 139 144  K 4.2 4.5 4.1  CL 101 99 106  CO2 27 28 27   GLUCOSE 101* 93 129*  BUN 29* 21 11  CREATININE 0.79 0.72 0.65  CALCIUM 10.0 9.6 9.2   Liver Function Tests:  Recent Labs Lab 03/05/14 1905 03/06/14 1300  AST  29 32  ALT 18 19  ALKPHOS 79 80  BILITOT 0.4 0.5  PROT 7.1 6.9  ALBUMIN 4.1 3.9   No results for input(s): LIPASE, AMYLASE in the last 168 hours. No results for input(s): AMMONIA in the last 168 hours. CBC:  Recent Labs Lab 03/05/14 1905 03/06/14 1300 03/08/14 0825  WBC 6.1 5.8 5.4  NEUTROABS  --  4.4  --   HGB 13.1 13.6 13.6  HCT 39.8 41.9 43.2  MCV 93.9 96.5 94.1  PLT 206 188 202   Cardiac Enzymes: No results for input(s): CKTOTAL, CKMB, CKMBINDEX, TROPONINI in the last 168 hours. BNP (last 3 results) No results for input(s): PROBNP in the last 8760 hours. CBG: No results for input(s): GLUCAP in the last 168 hours.  Recent Results (from the past 240 hour(s))  Urine culture     Status: None   Collection Time: 03/05/14  9:17 PM  Result Value Ref Range Status   Specimen Description URINE, CLEAN CATCH  Final   Special Requests ADDED [id number]  Final   Culture  Setup Time   Final    03/06/2014 06:41 Performed at [place]   Final    >=100,000 COLONIES/ML Performed at [facility]  Final    Multiple bacterial morphotypes present, none predominant. Suggest appropriate recollection if clinically indicated. Performed at [facility]    Report Status 03/07/2014 FINAL  Final     Studies: Mr Brain Wo Contrast  03/06/2014   CLINICAL DATA:  Weakness and intermittent confusion.  Prior strokes.  EXAM: MRI HEAD WITHOUT CONTRAST  TECHNIQUE: Multiplanar, multiecho pulse sequences of the brain and surrounding structures were obtained without intravenous contrast.  COMPARISON:  Head CT 02/23/2014  FINDINGS: There are small, patchy areas of acute infarction in the right cerebral hemisphere involving the centrum semiovale and posterior parietal lobes in a watershed distribution. No left cerebral hemispheric acute infarct is seen. There is no evidence of intracranial hemorrhage, mass, midline shift, or extra-axial fluid  collection. There is moderate cerebral atrophy. Patchy T2 hyperintensities in the subcortical and deep cerebral white matter are nonspecific but compatible with moderate chronic small vessel ischemic disease. Chronic lacunar infarcts are noted in the left thalamus, bilateral basal ganglia, and right corona radiata.  Prior bilateral cataract extraction is noted. Paranasal sinuses are clear. Small left mastoid effusion is noted. Visualized distal right internal carotid artery flow void in the neck is abnormal with the intracranial right ICA flow void appearing smaller in caliber diffusely compared to the left. Other major intracranial vascular flow voids are preserved.  IMPRESSION: 1. Acute right cerebral hemisphere watershed infarcts. 2. Abnormal appearance of the distal right internal carotid artery flow void, which may reflect high-grade stenosis or occlusion in the neck. 3. Moderate chronic small vessel ischemic disease and chronic lacunar infarcts as above.   Electronically Signed   By: Logan Bores   On: 03/06/2014 14:48   Mr Pamela [name] Head/brain Wo Cm  03/06/2014   CLINICAL DATA:  Weakness and intermittent confusion.  Prior strokes.  EXAM: MRA HEAD WITHOUT CONTRAST  TECHNIQUE: Angiographic images of the Circle of Willis were obtained using MRA technique without intravenous contrast.  COMPARISON:  None.  FINDINGS: The visualized distal vertebral arteries are patent with the left being slightly larger than the right. Left PICA origin is patent. Right PICA is not identified. Basilar artery is tortuous with moderate to severe stenosis in its midportion. Right AICA and bilateral SCA origins are patent. There is a fetal type origin of the left PCA. There is mild to moderate irregularity of the PCAs diffusely with mild narrowing of both P2 segments. A moderate to severe focal stenosis is noted more distally in the right P2 segment.  The visualized distal portions of the cervical internal carotid arteries demonstrate  areas of abnormal narrowing and dilatation bilaterally with regions of short segment dissection and pseudoaneurysm formation questioned. The intracranial internal carotid arteries are patent to the carotid termini bilaterally, however the intracranial right ICA is mildly smaller in caliber diffusely compared to the left with decreased intensity of flow related enhancement. There is moderate stenosis of the distal right supraclinoid ICA.  The right M1 segment is patent without stenosis. The left M1 segment is patent with irregular, mild narrowing. There is mild to moderate bilateral MCA branch vessel irregularity. The A1 segments are patent with the left being mildly dominant and with evidence of mild proximal A1 stenosis on the right. Anterior communicating artery is patent. There are multiple foci of mild to moderate stenosis involving A2 and more distal branches.  IMPRESSION: 1. No evidence of major intracranial arterial occlusion. 2. Abnormal appearance of the visualized portions of both distal cervical internal carotid arteries with findings suggestive of fibromuscular  dysplasia. The distal right ICA is patent but with evidence of decreased flow compared to the left. 3. Intracranial atherosclerosis resulting in moderate right supraclinoid ICA stenosis, moderate to severe basilar stenosis, moderate to severe right P2 stenosis, and moderate branch vessel irregularity.   Electronically Signed   By: Logan Bores   On: 03/06/2014 18:32    Scheduled Meds: . calcium carbonate  1,000 mg of elemental calcium Oral Q breakfast  . clopidogrel  75 mg Oral Daily  . enoxaparin (LOVENOX) injection  30 mg Subcutaneous Q24H  . ezetimibe  10 mg Oral Daily  . metoprolol tartrate  25 mg Oral BID   Continuous Infusions: . sodium chloride    . sodium chloride    . sodium chloride 100 mL/hr (03/06/14 1746)    Active Problems:   Stroke   CVA (cerebral infarction)   Weakness    Time spent: 62min  Jnya Brossard, [facility] Pager [phone]. If 7PM-7AM, please contact night-coverage at [email or web address], password [facility] 03/08/2014, 12:38 PM  LOS: 2 days

## 2014-03-08 NOTE — Progress Notes (Signed)
ANTICOAGULATION CONSULT NOTE - Initial Consult  Pharmacy Consult for Eliquis Indication: atrial fibrillation  Allergies  Allergen Reactions  . Statins Other (See Comments)    unknown    Patient Measurements: Height: 5\' 4"  (162.6 cm) Weight: 129 lb 4.8 oz (58.65 kg) IBW/kg (Calculated) : 54.7 Heparin Dosing Weight:    Vital Signs: Temp: 98.5 F (36.9 C) (12/14 1347) Temp Source: Oral (12/14 1347) BP: 107/44 mmHg (12/14 1347) Pulse Rate: 76 (12/14 1347)  Labs:  Recent Labs  03/05/14 1905 03/06/14 1300 03/08/14 0825  HGB 13.1 13.6 13.6  HCT 39.8 41.9 43.2  PLT 206 188 202  LABPROT  --  13.5  --   INR  --  1.02  --   CREATININE 0.79 0.72 0.65    Estimated Creatinine Clearance: 36.3 mL/min (by C-G formula based on Cr of 0.65).   Medical History: Past Medical History  Diagnosis Date  . Hyperlipidemia   . Hypertension   . Fibromyalgia   . Thyroid disease     hyperparathyroid  . PAD (peripheral artery disease)   . TIA (transient ischemic attack) ~ 2009    "only 1 that I know of" (12/13/2011)  . Atrial fibrillation     Not on Coumadin. Noted during 02/2009 admission.   . Skin cancer     Unknown types  . Schatzki's ring     Non obstructing ring on EGD 02/2009   . Pancreatic mass 02/2009    Abnormal CT abdomen, with cystic area in head and circumferential thickening of midbody of stomach  . Fibula fracture 12/11/2011    left  . Stroke 2006    "drags right leg very slightly" (12/13/2011)  . GERD (gastroesophageal reflux disease)   . H/O hiatal hernia   . Hard of hearing     "totally deaf in the right; 50% is gone in the left" (12/13/2011)    Medications:  Prescriptions prior to admission  Medication Sig Dispense Refill Last Dose  . amLODipine (NORVASC) 10 MG tablet Take 5 mg by mouth daily.   03/06/2014 at Unknown time  . Ascorbic Acid (VITAMIN C) 500 MG tablet Take 500 mg by mouth daily.     03/06/2014 at Unknown time  . aspirin 325 MG tablet Take 325 mg  by mouth daily.     03/06/2014 at Unknown time  . calcium carbonate (OS-CAL) 600 MG TABS Take 600 mg by mouth daily.     03/06/2014 at Unknown time  . ezetimibe (ZETIA) 10 MG tablet Take 1 tablet (10 mg total) by mouth daily. (Patient taking differently: Take 10 mg by mouth daily at 3 pm. ) 30 tablet 3 03/05/2014 at Unknown time  . losartan (COZAAR) 100 MG tablet take 1 tablet by mouth daily (Patient taking differently: Take 100 mg by mouth daily at 3 pm. take 1 tablet by mouth daily) 90 tablet 3 03/05/2014 at Unknown time  . magnesium oxide (MAG-OX) 400 (241.3 MG) MG tablet take 1 tablet by mouth once daily 30 tablet 5 03/06/2014 at Unknown time  . metoprolol tartrate (LOPRESSOR) 25 MG tablet take 1 tablet by mouth at bedtime (Patient taking differently: Take 25 mg by mouth at bedtime. take 1 tablet by mouth at bedtime) 90 tablet 3 03/05/2014 at 2000  . Multiple Vitamins-Minerals (CENTRUM SILVER PO) Take 1 tablet by mouth daily.     03/06/2014 at Unknown time  . ranitidine (ZANTAC) 150 MG tablet Take 150 mg by mouth 2 (two) times daily.   03/06/2014 at  Unknown time  . RESTASIS 0.05 % ophthalmic emulsion Place 1 drop into both eyes 2 (two) times daily.  0 03/06/2014 at Unknown time  . senna (SENOKOT) 8.6 MG tablet Take 1 tablet by mouth daily. hold for loose bowels   03/06/2014 at Unknown time  . urea (CARMOL) 40 % CREA Apply bid to heel, disp 30 grams 30 each 4 03/06/2014 at Unknown time    Assessment: 80 y/o F presents with new CVA with h/o HTN, HLD PAD, CVA and afib. Patient was not on anticoagulation PTA. LDl 129 (h/o statin intolerance). EF 60-65%. CHADSVASC=7. Baseline Hgb in the 13's ok. CrCl 36.  Goal of Therapy:  therapeutic oral anticoagulation Monitor platelets by anticoagulation protocol: Yes   Plan:  Eliquis 2.5mg  po BID D/c SQ Lovenox and Plavix   Merlin Golden S. Alford Highland, PharmD, BCPS Clinical Staff Pharmacist Pager [phone]   Eilene Ghazi Stillinger 03/08/2014,3:45  PM

## 2014-03-09 DIAGNOSIS — Z7901 Long term (current) use of anticoagulants: Secondary | ICD-10-CM

## 2014-03-09 MED ORDER — APIXABAN 2.5 MG PO TABS: 2.5000 mg | ORAL_TABLET | Freq: Two times a day (BID) | ORAL | Status: DC

## 2014-03-09 NOTE — Progress Notes (Signed)
STROKE TEAM PROGRESS NOTE   HISTORY Pamela [name] is an 80 y.o. female history of hypertension, hyperlipidemia, peripheral artery disease, stroke, and atrial fibrillation not on anticoagulation, presenting with new onset of left sided weakness. Initial symptoms began several days ago. However, she started leaning towards the left side yesterday 03/05/2014 with more obvious weakness. He's been taking aspirin daily. MRI of her brain showed multiple areas of watershed infarct involving the right hemisphere. NIH stroke score was 0. LSN: Unclear. tPA Given was not given as unclear when last seen well.    SUBJECTIVE (INTERVAL HISTORY) No complaints. Up in chair. Reports she has a bad memory. Is agreeable to SNF placement; her niece is helping her arrange.   OBJECTIVE Temp:  [97.6 F (36.4 C)-98.8 F (37.1 C)] 97.6 F (36.4 C) (12/15 0928) Pulse Rate:  [63-70] 68 (12/15 0928) Cardiac Rhythm:  [-] Normal sinus rhythm (12/14 1930) Resp:  [16-17] 16 (12/15 0928) BP: (116-170)/(43-73) 116/43 mmHg (12/15 0928) SpO2:  [95 %-100 %] 100 % (12/15 0928)  No results for input(s): GLUCAP in the last 168 hours.  Recent Labs Lab 03/05/14 1905 03/06/14 1300 03/08/14 0825  NA 141 139 144  K 4.2 4.5 4.1  CL 101 99 106  CO2 27 28 27   GLUCOSE 101* 93 129*  BUN 29* 21 11  CREATININE 0.79 0.72 0.65  CALCIUM 10.0 9.6 9.2    Recent Labs Lab 03/05/14 1905 03/06/14 1300  AST 29 32  ALT 18 19  ALKPHOS 79 80  BILITOT 0.4 0.5  PROT 7.1 6.9  ALBUMIN 4.1 3.9    Recent Labs Lab 03/05/14 1905 03/06/14 1300 03/08/14 0825  WBC 6.1 5.8 5.4  NEUTROABS  --  4.4  --   HGB 13.1 13.6 13.6  HCT 39.8 41.9 43.2  MCV 93.9 96.5 94.1  PLT 206 188 202   No results for input(s): CKTOTAL, CKMB, CKMBINDEX, TROPONINI in the last 168 hours. No results for input(s): LABPROT, INR in the last 72 hours. No results for input(s): COLORURINE, LABSPEC, [place], GLUCOSEU, HGBUR, BILIRUBINUR, KETONESUR, PROTEINUR,  UROBILINOGEN, NITRITE, LEUKOCYTESUR in the last 72 hours.  Invalid input(s): APPERANCEUR     Component Value Date/Time   CHOL 209* 03/07/2014 0540   TRIG 195* 03/07/2014 0540   HDL 41 03/07/2014 0540   CHOLHDL 5.1 03/07/2014 0540   VLDL 39 03/07/2014 0540   LDLCALC 129* 03/07/2014 0540   Lab Results  Component Value Date   HGBA1C 5.5 03/07/2014   No results found for: LABOPIA, COCAINSCRNUR, LABBENZ, AMPHETMU, THCU, LABBARB   Recent Labs Lab 03/06/14 1300  ETH <11   Dg Chest Port 1 View 03/05/2014    No active disease.    Ct Head Wo Contrast 03/05/2014    No acute intracranial process.  Involutional changes. Moderate to severe white matter changes suggest chronic small vessel ischemic disease. Remote LEFT thalamus lacunar infarct.     Mr Brain Wo Contrast 03/06/2014    1. Acute right cerebral hemisphere watershed infarcts.  2. Abnormal appearance of the distal right internal carotid artery flow void, which may reflect high-grade stenosis or occlusion in the neck.  3. Moderate chronic small vessel ischemic disease and chronic lacunar infarcts as above.     Mr Jodene Nam Head/brain Wo Cm 03/06/2014   1. No evidence of major intracranial arterial occlusion.  2. Abnormal appearance of the visualized portions of both distal cervical internal carotid arteries with findings suggestive of fibromuscular dysplasia. The distal right ICA is patent but  with evidence of decreased flow compared to the left.  3. Intracranial atherosclerosis resulting in moderate right supraclinoid ICA stenosis, moderate to severe basilar stenosis, moderate to severe right P2 stenosis, and moderate branch vessel irregularity.     Carotid Doppler  There is 1-39% bilateral ICA stenosis. Vertebral artery flow is antegrade.    2D Echocardiogram  EF 55-60% with no source of embolus.    PHYSICAL EXAM Frail elderly caucasian lady not in distress.  Afebrile. Head is nontraumatic. Neck is supple without bruit. Hearing  is poor Cardiac exam no murmur or gallop. Lungs are clear to auscultation. Distal pulses are well felt. Neurological Exam : Awake alert oriented x 2.diminished attention, registration and recall .Follows one and occasional two-step commands normal speech and language. Mild left lower face asymmetry. Tongue midline. No drift. Mild diminished fine finger movements on left. Orbits right over left upper extremity. Mild left grip weak. Normal sensation . Normal coordination.   ASSESSMENT/PLAN Ms. Pamela [name] is a 80 y.o. female with history of hypertension, hyperlipidemia, peripheral artery disease, previous stroke, and atrial fibrillation not anticoagulated presenting with new onset of left-sided weakness. She did not receive IV t-PA as he time of onset was unclear.  Stroke:  Non-dominant right cerebral watershed infarcts, embolic secondary to atrial fibrillation   Resultant  Mild left sided hemiparesis  MRI  Acute right cerebral hemisphere watershed infarcts.   MRA  as above  Carotid Doppler - 1-39% ICA stenosis. Vertebral artery flow is antegrade.  2D Echo - EF 60-65%. No cardiac source of emboli identified  HgbA1c - 5.8  Lovenox for VTE prophylaxis  Diet Heart heart healthy diet with thin liquids.  aspirin 325 mg orally every day prior to admission, change to  eliquis (apixaban) 2.5 mg bid  Patient counseled to be compliant with her antithrombotic medications  Ongoing aggressive stroke risk factor management  Therapy recommendations:  SNF  Disposition:  SNF ( lived alone prior to admission )  Hypertension  Home meds:   Norvasc 5 mg daily, Cozaar 100 mg daily, metoprolol 25 mg at bedtime.   Stable  Hyperlipidemia  Home meds:   Zetia 10 mg daily - resumed in hospital  LDL 129, goal < 70 - History of statin intolerance  On zetia. Will not recommend/start statin given advanced age and hx of statin intolerance  Other Stroke Risk Factors  Advanced age  Hx  stroke/TIA  Other Active Problems  Hyperlipidemia with history of statin intolerance  Great niece who reports the patient is very compliant with medications.  Possible carotid artery disease.  Other Pertinent History  Hard of hearing  NO FURTHER STROKE WORKUP INDICATED  Patient has a 10-15% risk of having another stroke over the next year, the highest risk is within 2 weeks of the most recent stroke/TIA (risk of having a stroke following a stroke or TIA is the same).  Ongoing risk factor control by Primary Care Physician  Stroke Service will sign off. Please call should any needs arise.  Follow up with Dr. Antony Contras at [facility] in 1 month(s), order placed.   Hospital day # Stewardson Lawler for Pager information 03/09/2014 3:52 PM  I have personally examined this patient, reviewed notes, independently viewed imaging studies, participated in medical decision making and plan of care. I have made any additions or clarifications directly to the above note. Agree with note above. Stroke team will sign off. D/W neice and answered  questions.   Antony Contras, MD Medical Director [facility] Pager: [phone] 03/09/2014 3:52 PM  To contact Stroke Continuity provider, please refer to [email or web address] After hours, contact General Neurology

## 2014-03-09 NOTE — Progress Notes (Signed)
Pt transported per stretcher by ambulance personnel to facility. No acute distress noted.   Angeline Slim I 03/09/2014 1:26 PM

## 2014-03-09 NOTE — Progress Notes (Signed)
Report given to Timmothy Sours, Therapist, sports at [facility]. Pt to be transported to facility per ambulance  With ambulance personnel.  Will monitor   Angeline Slim I 03/09/2014 10:56 AM

## 2014-03-09 NOTE — Discharge Summary (Signed)
Physician Discharge Summary  TALISE SLIGH GDJ:[id number] DOB: 12/17/1916 DOA: 03/06/2014  PCP: Arnette Norris, MD  Admit date: 03/06/2014 Discharge date: 03/09/2014  Time spent: 30 minutes  Recommendations for Outpatient Follow-up:  1. Follow up with PCP in 1-2 weeks  Discharge Diagnoses:  Active Problems:   Stroke   CVA (cerebral infarction)   Weakness   Chronic a-fib   Discharge Condition: Stable  Diet recommendation: Heart healthy  Filed Weights   03/06/14 1705  Weight: 58.65 kg (129 lb 4.8 oz)    History of present illness:  Please see admit h and p from 12/12 for details. Briefly, pt presents with Fall and confusion. The patient was admitted for further work up.  Hospital Course:  1. Acute R cerebral hemisphere watershed infarcts 1. Neurology consulted and had been following 2. 2d echo with grade 2 diastolic dysfunction, EF 62-22% 3. Carotid dopplers without stenosis 4. PT/OT with recs for SNF 5. Neurology recommended transition to eliquis 2.5mg  bid for secondary stroke prevention 2. HTN 1. Allowing permissive HTN 2. Stable 3. Added back metorolol given hx a fib and reports of PVC's 4. Would further resume home bp meds on discharge 3. HLD 1. LDL 129 2. Pt with documented allergy to statin 3. On Zetia 4. Afib 1. Rate controlled 5. DVT prophylaxis 1. Lovenox subQ  Consultations:  Neurology  Discharge Exam: Filed Vitals:   03/08/14 2220 03/09/14 0224 03/09/14 0547 03/09/14 0928  BP: 147/45 156/59 170/65 116/43  Pulse: 67 70 63 68  Temp: 98.6 F (37 C) 98.8 F (37.1 C) 98.6 F (37 C) 97.6 F (36.4 C)  TempSrc: Oral Oral Oral Oral  Resp: 17 17 17 16   Height:      Weight:      SpO2: 97% 96% 100% 100%    General: awake, in nad Cardiovascular: regular, s1, s2 Respiratory: normal resp effort, no wheezing  Discharge Instructions      Discharge Instructions    Ambulatory referral to Neurology    Complete by:  As directed   Stroke patient. Dr.  Leonie Man prefers follow up in 1 month            Medication List    STOP taking these medications        aspirin 325 MG tablet      TAKE these medications        amLODipine 10 MG tablet  Commonly known as:  NORVASC  Take 5 mg by mouth daily.     apixaban 2.5 MG Tabs tablet  Commonly known as:  ELIQUIS  Take 1 tablet (2.5 mg total) by mouth every 12 (twelve) hours.     calcium carbonate 600 MG Tabs tablet  Commonly known as:  OS-CAL  Take 600 mg by mouth daily.     CENTRUM SILVER PO  Take 1 tablet by mouth daily.     ezetimibe 10 MG tablet  Commonly known as:  ZETIA  Take 1 tablet (10 mg total) by mouth daily.     losartan 100 MG tablet  Commonly known as:  COZAAR  take 1 tablet by mouth daily     magnesium oxide 400 (241.3 MG) MG tablet  Commonly known as:  MAG-OX  take 1 tablet by mouth once daily     metoprolol tartrate 25 MG tablet  Commonly known as:  LOPRESSOR  take 1 tablet by mouth at bedtime     ranitidine 150 MG tablet  Commonly known as:  ZANTAC  Take 150  mg by mouth 2 (two) times daily.     RESTASIS 0.05 % ophthalmic emulsion  Generic drug:  cycloSPORINE  Place 1 drop into both eyes 2 (two) times daily.     senna 8.6 MG tablet  Commonly known as:  SENOKOT  Take 1 tablet by mouth daily. hold for loose bowels     urea 40 % Crea  Commonly known as:  CARMOL  Apply bid to heel, disp 30 grams     vitamin C 500 MG tablet  Commonly known as:  ASCORBIC ACID  Take 500 mg by mouth daily.       Allergies  Allergen Reactions  . Statins Other (See Comments)    unknown   Follow-up Information    Follow up with SETHI,PRAMOD, MD In 2 weeks.   Specialties:  Neurology, Radiology   Why:  Stroke Clinic, Office will call you with appointment date & time   Contact information:   [address] [address] [phone]       Follow up with Arnette Norris, MD. Schedule an appointment as soon as possible for a visit in 1 week.    Specialty:  Family Medicine   Contact information:   [place] [address] [phone]        The results of significant diagnostics from this hospitalization (including imaging, microbiology, ancillary and laboratory) are listed below for reference.    Significant Diagnostic Studies: Ct Head Wo Contrast  03/05/2014   CLINICAL DATA:  Altered mental status 2 days ago, return to baseline, new episode of confusion today, generalized weakness and worsening incontinence.  EXAM: CT HEAD WITHOUT CONTRAST  TECHNIQUE: Contiguous axial images were obtained from the base of the skull through the vertex without intravenous contrast.  COMPARISON:  CT of the head May 05, 2012  FINDINGS: The ventricles and sulci are normal for age. No intraparenchymal hemorrhage, mass effect nor midline shift. Cough 1 supratentorial white matter hypodensities. No acute large vascular territory infarcts. Chronic LEFT thalamus lacunar infarct.  No abnormal extra-axial fluid collections. Basal cisterns are patent. Moderate calcific atherosclerosis of the carotid siphons.  No skull fracture. The included ocular globes and orbital contents are non-suspicious. The mastoid aircells and included paranasal sinuses are well-aerated.  IMPRESSION: No acute intracranial process.  Involutional changes. Moderate to severe white matter changes suggest chronic small vessel ischemic disease. Remote LEFT thalamus lacunar infarct.   Electronically Signed   By: Elon Alas   On: 03/05/2014 22:30   Mr Brain Wo Contrast  03/06/2014   CLINICAL DATA:  Weakness and intermittent confusion.  Prior strokes.  EXAM: MRI HEAD WITHOUT CONTRAST  TECHNIQUE: Multiplanar, multiecho pulse sequences of the brain and surrounding structures were obtained without intravenous contrast.  COMPARISON:  Head CT 02/23/2014  FINDINGS: There are small, patchy areas of acute infarction in the right cerebral hemisphere involving the centrum  semiovale and posterior parietal lobes in a watershed distribution. No left cerebral hemispheric acute infarct is seen. There is no evidence of intracranial hemorrhage, mass, midline shift, or extra-axial fluid collection. There is moderate cerebral atrophy. Patchy T2 hyperintensities in the subcortical and deep cerebral white matter are nonspecific but compatible with moderate chronic small vessel ischemic disease. Chronic lacunar infarcts are noted in the left thalamus, bilateral basal ganglia, and right corona radiata.  Prior bilateral cataract extraction is noted. Paranasal sinuses are clear. Small left mastoid effusion is noted. Visualized distal right internal carotid artery flow void in the  neck is abnormal with the intracranial right ICA flow void appearing smaller in caliber diffusely compared to the left. Other major intracranial vascular flow voids are preserved.  IMPRESSION: 1. Acute right cerebral hemisphere watershed infarcts. 2. Abnormal appearance of the distal right internal carotid artery flow void, which may reflect high-grade stenosis or occlusion in the neck. 3. Moderate chronic small vessel ischemic disease and chronic lacunar infarcts as above.   Electronically Signed   By: Logan Bores   On: 03/06/2014 14:48   Dg Chest Port 1 View  03/05/2014   CLINICAL DATA:  Altered mental status and weakness.  EXAM: PORTABLE CHEST - 1 VIEW  COMPARISON:  12/14/2011  FINDINGS: Shallow inspiration. Patient rotated. The heart size and mediastinal contours are within normal limits. Both lungs are clear. The visualized skeletal structures are unremarkable.  IMPRESSION: No active disease.   Electronically Signed   By: Lucienne Capers M.D.   On: 03/05/2014 21:09   Mr Jodene Nam Head/brain Wo Cm  03/06/2014   CLINICAL DATA:  Weakness and intermittent confusion.  Prior strokes.  EXAM: MRA HEAD WITHOUT CONTRAST  TECHNIQUE: Angiographic images of the Circle of Willis were obtained using MRA technique without  intravenous contrast.  COMPARISON:  None.  FINDINGS: The visualized distal vertebral arteries are patent with the left being slightly larger than the right. Left PICA origin is patent. Right PICA is not identified. Basilar artery is tortuous with moderate to severe stenosis in its midportion. Right AICA and bilateral SCA origins are patent. There is a fetal type origin of the left PCA. There is mild to moderate irregularity of the PCAs diffusely with mild narrowing of both P2 segments. A moderate to severe focal stenosis is noted more distally in the right P2 segment.  The visualized distal portions of the cervical internal carotid arteries demonstrate areas of abnormal narrowing and dilatation bilaterally with regions of short segment dissection and pseudoaneurysm formation questioned. The intracranial internal carotid arteries are patent to the carotid termini bilaterally, however the intracranial right ICA is mildly smaller in caliber diffusely compared to the left with decreased intensity of flow related enhancement. There is moderate stenosis of the distal right supraclinoid ICA.  The right M1 segment is patent without stenosis. The left M1 segment is patent with irregular, mild narrowing. There is mild to moderate bilateral MCA branch vessel irregularity. The A1 segments are patent with the left being mildly dominant and with evidence of mild proximal A1 stenosis on the right. Anterior communicating artery is patent. There are multiple foci of mild to moderate stenosis involving A2 and more distal branches.  IMPRESSION: 1. No evidence of major intracranial arterial occlusion. 2. Abnormal appearance of the visualized portions of both distal cervical internal carotid arteries with findings suggestive of fibromuscular dysplasia. The distal right ICA is patent but with evidence of decreased flow compared to the left. 3. Intracranial atherosclerosis resulting in moderate right supraclinoid ICA stenosis, moderate to  severe basilar stenosis, moderate to severe right P2 stenosis, and moderate branch vessel irregularity.   Electronically Signed   By: Logan Bores   On: 03/06/2014 18:32    Microbiology: Recent Results (from the past 240 hour(s))  Urine culture     Status: None   Collection Time: 03/05/14  9:17 PM  Result Value Ref Range Status   Specimen Description URINE, [facility]  Final   Special Requests ADDED [id number]  Final   Culture  Setup Time   Final    03/06/2014 06:41  Performed at [place]   Final    >=100,000 COLONIES/ML Performed at [facility]   Final    Multiple bacterial morphotypes present, none predominant. Suggest appropriate recollection if clinically indicated. Performed at [facility]    Report Status 03/07/2014 FINAL  Final     Labs: Basic Metabolic Panel:  Recent Labs Lab 03/05/14 1905 03/06/14 1300 03/08/14 0825  NA 141 139 144  K 4.2 4.5 4.1  CL 101 99 106  CO2 27 28 27   GLUCOSE 101* 93 129*  BUN 29* 21 11  CREATININE 0.79 0.72 0.65  CALCIUM 10.0 9.6 9.2   Liver Function Tests:  Recent Labs Lab 03/05/14 1905 03/06/14 1300  AST 29 32  ALT 18 19  ALKPHOS 79 80  BILITOT 0.4 0.5  PROT 7.1 6.9  ALBUMIN 4.1 3.9   No results for input(s): LIPASE, AMYLASE in the last 168 hours. No results for input(s): AMMONIA in the last 168 hours. CBC:  Recent Labs Lab 03/05/14 1905 03/06/14 1300 03/08/14 0825  WBC 6.1 5.8 5.4  NEUTROABS  --  4.4  --   HGB 13.1 13.6 13.6  HCT 39.8 41.9 43.2  MCV 93.9 96.5 94.1  PLT 206 188 202   Cardiac Enzymes: No results for input(s): CKTOTAL, CKMB, CKMBINDEX, TROPONINI in the last 168 hours. BNP: BNP (last 3 results) No results for input(s): PROBNP in the last 8760 hours. CBG: No results for input(s): GLUCAP in the last 168 hours.     Signed:  CHIU, STEPHEN K  [facility] 03/09/2014, 10:12 AM

## 2014-03-09 NOTE — Clinical Social Work Note (Signed)
Clinical Social Worker facilitated patient discharge including contacting patient family and facility to confirm patient discharge plans.  Clinical information faxed to facility and family agreeable with plan.  CSW arranged ambulance transport via [facility] to [facility].  RN to call report prior to discharge.  Clinical Social Worker will sign off for now as social work intervention is no longer needed. Please consult [place] again if new need arises.  Glendon Axe, MSW, LCSWA [phone] 03/09/2014 10:33 AM

## 2014-03-10 ENCOUNTER — Non-Acute Institutional Stay (SKILLED_NURSING_FACILITY): Payer: Medicare Other | Admitting: Registered Nurse

## 2014-03-10 ENCOUNTER — Encounter: Payer: Self-pay | Admitting: Registered Nurse

## 2014-03-10 DIAGNOSIS — E785 Hyperlipidemia, unspecified: Secondary | ICD-10-CM

## 2014-03-10 DIAGNOSIS — L97521 Non-pressure chronic ulcer of other part of left foot limited to breakdown of skin: Secondary | ICD-10-CM

## 2014-03-10 DIAGNOSIS — K5900 Constipation, unspecified: Secondary | ICD-10-CM

## 2014-03-10 DIAGNOSIS — I480 Paroxysmal atrial fibrillation: Secondary | ICD-10-CM

## 2014-03-10 DIAGNOSIS — K219 Gastro-esophageal reflux disease without esophagitis: Secondary | ICD-10-CM

## 2014-03-10 DIAGNOSIS — I10 Essential (primary) hypertension: Secondary | ICD-10-CM

## 2014-03-10 DIAGNOSIS — I639 Cerebral infarction, unspecified: Secondary | ICD-10-CM

## 2014-03-10 NOTE — Progress Notes (Signed)
Patient ID: Pamela [name], female   DOB: 1916-07-23, 80 y.o.   MRN: [id number]   Place of Service: [facility]  Allergies  Allergen Reactions  . Statins Other (See Comments)    unknown    Code Status: Full Code  Goals of Care: Longevity/STR  Chief Complaint  Patient presents with  . Hospitalization Follow-up    HPI 80 y.o. female with PMH of HTN, HLD, fibromyalgia, paroxysmal afib, PAD among others is being seen for a follow-up visit post hospital admission from 03/06/14 to 03/09/14 for acute right cerebral hemisphere watershed infarcts. Seen in bed today. Family at beside. No complaints verbalized by patient  Review of Systems Constitutional: Negative for fever and chills. Positive for fatigue. HENT: Negative for ear pain, congestion, and sore throat. Positive for hearing impairment R>L  Eyes: Negative for eye pain and eye discharge  Cardiovascular: Negative for chest pain, palpitations, and leg swelling Respiratory: Negative cough, shortness of breath, and wheezing.  Gastrointestinal: Negative for nausea and vomiting. Negative for abdominal pain, diarrhea and constipation.  Genitourinary: Negative for dysuria and hematuria Endocrine: Negative for polydipsia, polyphagia, and polyuria Musculoskeletal: Negative for back pain, joint pain, and joint swelling  Neurological: Negative for dizziness and headache. Positive for muscle weankess Skin: Negative for rash. Positive for small wound on Left foot-resulted from "ruptured vessel" Psychiatric: Negative for depression  Past Medical History  Diagnosis Date  . Hyperlipidemia   . Hypertension   . Fibromyalgia   . Thyroid disease     hyperparathyroid  . PAD (peripheral artery disease)   . TIA (transient ischemic attack) ~ 2009    "only 1 that I know of" (12/13/2011)  . Atrial fibrillation     Not on Coumadin. Noted during 02/2009 admission.   . Skin cancer     Unknown types  . Schatzki's ring     Non obstructing  ring on EGD 02/2009   . Pancreatic mass 02/2009    Abnormal CT abdomen, with cystic area in head and circumferential thickening of midbody of stomach  . Fibula fracture 12/11/2011    left  . Stroke 2006    "drags right leg very slightly" (12/13/2011)  . GERD (gastroesophageal reflux disease)   . H/O hiatal hernia   . Hard of hearing     "totally deaf in the right; 50% is gone in the left" (12/13/2011)    Past Surgical History  Procedure Laterality Date  . Appendectomy  1940  . Parathyroidectomy  1980  . Vaginal hysterectomy  1970    History  Substance Use Topics  . Smoking status: Never Smoker   . Smokeless tobacco: Never Used  . Alcohol Use: No    Family History  Problem Relation Age of Onset  . Cancer Other     breast ca  . Cancer      mouth ca       Medication List       This list is accurate as of: 03/10/14  3:30 PM.  Always use your most recent med list.               amLODipine 10 MG tablet  Commonly known as:  NORVASC  Take 5 mg by mouth daily.     apixaban 2.5 MG Tabs tablet  Commonly known as:  ELIQUIS  Take 1 tablet (2.5 mg total) by mouth every 12 (twelve) hours.     calcium carbonate 600 MG Tabs tablet  Commonly known as:  OS-CAL  Take 600 mg by mouth daily.     CENTRUM SILVER PO  Take 1 tablet by mouth daily.     ezetimibe 10 MG tablet  Commonly known as:  ZETIA  Take 1 tablet (10 mg total) by mouth daily.     losartan 100 MG tablet  Commonly known as:  COZAAR  take 1 tablet by mouth daily     magnesium oxide 400 (241.3 MG) MG tablet  Commonly known as:  MAG-OX  take 1 tablet by mouth once daily     metoprolol tartrate 25 MG tablet  Commonly known as:  LOPRESSOR  take 1 tablet by mouth at bedtime     ranitidine 150 MG tablet  Commonly known as:  ZANTAC  Take 150 mg by mouth 2 (two) times daily.     RESTASIS 0.05 % ophthalmic emulsion  Generic drug:  cycloSPORINE  Place 1 drop into both eyes 2 (two) times daily.     senna  8.6 MG tablet  Commonly known as:  SENOKOT  Take 1 tablet by mouth daily. hold for loose bowels     urea 40 % Crea  Commonly known as:  CARMOL  Apply bid to heel, disp 30 grams     vitamin C 500 MG tablet  Commonly known as:  ASCORBIC ACID  Take 500 mg by mouth daily.        Physical Exam  BP 156/73 mmHg  Pulse 68  Temp(Src) 97 F (36.1 C)  Resp 18  Ht 5\' 4"  (1.626 m)  Wt 128 lb (58.06 kg)  BMI 21.96 kg/m2  SpO2 94%  Constitutional: thin elderly female in no acute distress. Conversant and pleasant HEENT: Normocephalic and atraumatic. PERRL. EOM intact. No icterus. Eyeglasses in place. Left ear hearing aid in place. Has hearing impairment. No nasal discharge or sinus tenderness. Oral mucosa moist. Posterior pharynx clear of any exudate or lesions. Teeth and gingiva in good general condition.  Neck: Supple and nontender. No lymphadenopathy, masses, or thyromegaly. No JVD or carotid bruits. Cardiac: Normal S1, S2. RRR without appreciable murmurs, rubs, or gallops. Distal pulses intact. No dependent edema.  Lungs: No respiratory distress. Breath sounds clear bilaterally without rales, rhonchi, or wheezes. Abdomen: Audible bowel sounds in all quadrants. Soft, nontender, nondistended. Musculoskeletal: able to move all extremities. Generalized muscle weakness. No joint erythema or tenderness. Spine and Back: Normal spinal profile. No scoliosis or kyphosis. No tenderness over spines. No CVA tenderness.  Skin: Warm and dry. Shallow ulcer 1x0.5x0.2cm noted on medial dorsal aspect of left foot w/o signs of infection.  Neurological: Alert and oriented to person, place, and time. No focal deficits. Psychiatric: Judgment and insight adequate. Appropriate mood and affect.   Labs Reviewed  CBC Latest Ref Rng 03/08/2014 03/06/2014 03/05/2014  WBC 4.0 - 10.5 K/uL 5.4 5.8 6.1  Hemoglobin 12.0 - 15.0 g/dL 13.6 13.6 13.1  Hematocrit 36.0 - 46.0 % 43.2 41.9 39.8  Platelets 150 - 400 K/uL 202  188 206    CMP Latest Ref Rng 03/08/2014 03/06/2014 03/05/2014  Glucose 70 - 99 mg/dL 129(H) 93 101(H)  BUN 6 - 23 mg/dL 11 21 29(H)  Creatinine 0.50 - 1.10 mg/dL 0.65 0.72 0.79  Sodium 137 - 147 mEq/L 144 139 141  Potassium 3.7 - 5.3 mEq/L 4.1 4.5 4.2  Chloride 96 - 112 mEq/L 106 99 101  CO2 19 - 32 mEq/L 27 28 27   Calcium 8.4 - 10.5 mg/dL 9.2 9.6 10.0  Total Protein 6.0 - 8.3  g/dL - 6.9 7.1  Total Bilirubin 0.3 - 1.2 mg/dL - 0.5 0.4  Alkaline Phos 39 - 117 U/L - 80 79  AST 0 - 37 U/L - 32 29  ALT 0 - 35 U/L - 19 18    Lab Results  Component Value Date   HGBA1C 5.5 03/07/2014    Lab Results  Component Value Date   TSH 1.05 01/08/2014    Lipid Panel     Component Value Date/Time   CHOL 209* 03/07/2014 0540   TRIG 195* 03/07/2014 0540   HDL 41 03/07/2014 0540   CHOLHDL 5.1 03/07/2014 0540   VLDL 39 03/07/2014 0540   LDLCALC 129* 03/07/2014 0540   LDLDIRECT 181.8 03/03/2014 0928    Diagnostic Studies Reviewed 03/05/14-Head CT: No acute intracranial process. Involutional changes. Moderate to severe white matter changes suggest chronic small vessel ischemic disease. Remote left thalamus lacunar infarct.   03/06/14-MRI: 1. Acute right cerebral hemisphere watershed infarcts. 2. Abnormal appearance of the distal right internal carotid artery flow void, which may reflect high-grade stenosis or occlusion in the Neck. 3. Moderate chronic small vessel ischemic disease and chronic lacunar infarcts as above  Assessment & Plan 1. Essential hypertension BP range 129/61 to 180/84 here. Monitor BP every shift. If BP consistently above 140/90 will adjust her antihypertensives. Continue norvasc 5mg  daily, losartan 100mg  daily, and lopressor 25mg  daily. Continue to monitor her status  2. Paroxysmal atrial fibrillation Stable. Rate-controlled. Continue lopressor 25mg  daily and eliquis 2.5mg  twice daily. Continue to monitor  3. Constipation, unspecified constipation type Continue  senna daily and monitor  4. Hyperlipidemia LDL 129. Documented allergy to statin. Continue zetia 10mg  daily and monitor  5. Foot ulceration, left, limited to breakdown of skin Collagen and calcium alginate every three days. Continue to monitor   6. Right hemisphere, cerebral infarction Continue eliquis 2.5mg  twice daily for secondary stroke prevention. Continue to work with PT/OT for strength/gait/balance training. Fall risk precautions. F/u with neurology, Dr. Mamie Nick in about 2 weeks. Continue to monitor her status  7. Gastroesophageal reflux disease, esophagitis presence not specified Continue zantac 150mg  twice daily and monitor.  Time spent: 40 minutes on counseling and care coordination   Family/Staff Communication Plan of care discussed with patient, family, and nursing staff. Patient, family, and nursing staff verbalized understanding and agree with plan of care. No additional questions or concerns reported.    Arthur Holms, MSN, AGNP-C [facility] [address] [place], [place] [address] [phone] [8am-5pm] After hours: [phone]

## 2014-03-11 ENCOUNTER — Non-Acute Institutional Stay (SKILLED_NURSING_FACILITY): Payer: Medicare Other | Admitting: Internal Medicine

## 2014-03-11 ENCOUNTER — Encounter: Payer: Self-pay | Admitting: Internal Medicine

## 2014-03-11 DIAGNOSIS — R531 Weakness: Secondary | ICD-10-CM

## 2014-03-11 DIAGNOSIS — I639 Cerebral infarction, unspecified: Secondary | ICD-10-CM

## 2014-03-11 DIAGNOSIS — I482 Chronic atrial fibrillation, unspecified: Secondary | ICD-10-CM

## 2014-03-11 DIAGNOSIS — M25552 Pain in left hip: Secondary | ICD-10-CM

## 2014-03-11 DIAGNOSIS — K219 Gastro-esophageal reflux disease without esophagitis: Secondary | ICD-10-CM

## 2014-03-11 DIAGNOSIS — I10 Essential (primary) hypertension: Secondary | ICD-10-CM

## 2014-03-11 DIAGNOSIS — K5900 Constipation, unspecified: Secondary | ICD-10-CM

## 2014-03-11 NOTE — Progress Notes (Signed)
Patient ID: Pamela [name], female   DOB: September 10, 1916, 80 y.o.   MRN: [id number]     Facility: [facility]    PCP: Arnette Norris, MD  Code Status: full code  Allergies  Allergen Reactions  . Statins Other (See Comments)    unknown    Chief Complaint  Patient presents with  . New Admit To SNF     HPI:  80 year old female patient is here for short term rehabilitation post hospital admission from 03/06/14 to 03/09/14 with fall and confusion. She was noted to have acute right cerebral hemisphere watershed infarcts. Neurology was consulted and she had stroke workup. She was started on eliquis. She has PMH of HTN, HLD, fibromyalgia, paroxysmal afib, PAD. She is seen in her room today. She had a fall yesterday evening while trying to get out of her commode unassisted. She denies any LOC. She fell on her left side and complaints of pain in her left cheek bone, left hip and left knee area. Denies any other concerns. Has been working with therapy team. No new concern from staff.  Review of Systems:  Constitutional: Negative for fever, chills and diaphoresis.  HENT: positive for hearing loss. Negative for congestion   Respiratory: Negative for cough, sputum production, shortness of breath and wheezing.   Cardiovascular: Negative for chest pain, palpitations, leg swelling.  Gastrointestinal: Negative for heartburn, nausea, vomiting, abdominal pain. Had a bowel movement yesterday. Genitourinary: Negative for dysuria Musculoskeletal: Negative for back pain Skin: Negative for itching, rash.  Neurological: Negative for dizziness, tingling, focal weakness and headaches.  Psychiatric/Behavioral: Negative for depression and insomnia   Past Medical History  Diagnosis Date  . Hyperlipidemia   . Hypertension   . Fibromyalgia   . Thyroid disease     hyperparathyroid  . PAD (peripheral artery disease)   . TIA (transient ischemic attack) ~ 2009    "only 1 that I know of"  (12/13/2011)  . Atrial fibrillation     Not on Coumadin. Noted during 02/2009 admission.   . Skin cancer     Unknown types  . Schatzki's ring     Non obstructing ring on EGD 02/2009   . Pancreatic mass 02/2009    Abnormal CT abdomen, with cystic area in head and circumferential thickening of midbody of stomach  . Fibula fracture 12/11/2011    left  . Stroke 2006    "drags right leg very slightly" (12/13/2011)  . GERD (gastroesophageal reflux disease)   . H/O hiatal hernia   . Hard of hearing     "totally deaf in the right; 50% is gone in the left" (12/13/2011)   Past Surgical History  Procedure Laterality Date  . Appendectomy  1940  . Parathyroidectomy  1980  . Vaginal hysterectomy  1970   Social History:   reports that she has never smoked. She has never used smokeless tobacco. She reports that she does not drink alcohol or use illicit drugs.  Family History  Problem Relation Age of Onset  . Cancer Other     breast ca  . Cancer      mouth ca     Medications: Patient's Medications  New Prescriptions   No medications on file  Previous Medications   AMLODIPINE (NORVASC) 10 MG TABLET    Take 5 mg by mouth daily.   APIXABAN (ELIQUIS) 2.5 MG TABS TABLET    Take 1 tablet (2.5 mg total) by mouth every 12 (twelve) hours.  ASCORBIC ACID (VITAMIN C) 500 MG TABLET    Take 500 mg by mouth daily.     CALCIUM CARBONATE (OS-CAL) 600 MG TABS    Take 600 mg by mouth daily.     EZETIMIBE (ZETIA) 10 MG TABLET    Take 1 tablet (10 mg total) by mouth daily.   LOSARTAN (COZAAR) 100 MG TABLET    take 1 tablet by mouth daily   MAGNESIUM OXIDE (MAG-OX) 400 (241.3 MG) MG TABLET    take 1 tablet by mouth once daily   METOPROLOL TARTRATE (LOPRESSOR) 25 MG TABLET    take 1 tablet by mouth at bedtime   MULTIPLE VITAMINS-MINERALS (CENTRUM SILVER PO)    Take 1 tablet by mouth daily.     RANITIDINE (ZANTAC) 150 MG TABLET    Take 150 mg by mouth 2 (two) times daily.   RESTASIS 0.05 % OPHTHALMIC EMULSION     Place 1 drop into both eyes 2 (two) times daily.   SENNA (SENOKOT) 8.6 MG TABLET    Take 1 tablet by mouth daily. hold for loose bowels   UREA (CARMOL) 40 % CREA    Apply bid to heel, disp 30 grams  Modified Medications   No medications on file  Discontinued Medications   No medications on file     Physical Exam: Filed Vitals:   03/11/14 0938  BP: 137/60  Pulse: 60  Temp: 98 F (36.7 C)  Resp: 18  SpO2: 96%    General- elderly female in no acute distress Head- atraumatic, normocephalic Eyes- PERRLA, EOMI, no pallor, no icterus, no discharge Neck- no cervical lymphadenopathy Throat- moist mucus membrane Cardiovascular- normal s1,s2, no murmurs, palpable dorsalis pedis Respiratory- bilateral clear to auscultation, no wheeze, no rhonchi, no crackles, no use of accessory muscles Abdomen- bowel sounds present, soft, non tender Musculoskeletal- able to move all 4 extremities, generalized weakness, ROM at left knee normal, left hip joint pain with movement and weight bearing, no leg edema Neurological- no focal deficit Skin- warm and dry, left foot ulcer and blanchable redness in buttock area Psychiatry- alert and oriented to person, place and time, normal mood and affect    Labs reviewed: Basic Metabolic Panel:  Recent Labs  03/05/14 1905 03/06/14 1300 03/08/14 0825  NA 141 139 144  K 4.2 4.5 4.1  CL 101 99 106  CO2 27 28 27   GLUCOSE 101* 93 129*  BUN 29* 21 11  CREATININE 0.79 0.72 0.65  CALCIUM 10.0 9.6 9.2   Liver Function Tests:  Recent Labs  01/08/14 1033 03/05/14 1905 03/06/14 1300  AST 22 29 32  ALT 16 18 19   ALKPHOS 62 79 80  BILITOT 0.6 0.4 0.5  PROT 7.4 7.1 6.9  ALBUMIN 3.8 4.1 3.9   No results for input(s): LIPASE, AMYLASE in the last 8760 hours. No results for input(s): AMMONIA in the last 8760 hours. CBC:  Recent Labs  01/08/14 1033 03/05/14 1905 03/06/14 1300 03/08/14 0825  WBC 5.9 6.1 5.8 5.4  NEUTROABS 3.7  --  4.4  --   HGB  14.2 13.1 13.6 13.6  HCT 43.9 39.8 41.9 43.2  MCV 93.2 93.9 96.5 94.1  PLT 205.0 206 188 202   Radiological Exams: 03/05/14-Head CT: No acute intracranial process. Involutional changes. Moderate to severe white matter changes suggest chronic small vessel ischemic disease. Remote left thalamus lacunar infarct.   03/06/14-MRI: 1. Acute right cerebral hemisphere watershed infarcts. 2. Abnormal appearance of the distal right internal carotid artery flow  void, which may reflect high-grade stenosis or occlusion in the Neck. 3. Moderate chronic small vessel ischemic disease and chronic lacunar infarcts as above   Assessment/Plan  Generalized weakness Post CVA. Will have patient work with PT/OT as tolerated to regain strength and restore function.  Fall precautions are in place. Has f/u with neurology  Left hip pain Post fall, will get xray of pelvis and left hip to rule out fracture. Fall precautions in place. Tylenol 650 mg bid for pain x 3 days then prn  CVA bp stable, to work with PT and OT for strengthening exercise and gait training. Continue eliquis. Is allergic to statin.  afib Rate controlled, continue lopressor and eliquis.  Essential hypertension Stable at present. Continue norvasc 5mg  daily, losartan 100mg  daily and lopressor 25mg  daily.   Constipation Continue senna daily and monitor  Left foot ulceration Collagen and calcium alginate every three days and monitor   gerd Continue zantac 150mg  bid   Family/ staff Communication: reviewed care plan with patient and nursing supervisor   Goals of care: short term rehabilitation    Blanchie Serve, MD  [facility] [phone] (Monday-Friday 8 am - 5 pm) [phone] (afterhours)

## 2014-03-26 DIAGNOSIS — H918X2 Other specified hearing loss, left ear: Secondary | ICD-10-CM | POA: Diagnosis not present

## 2014-03-26 DIAGNOSIS — I6930 Unspecified sequelae of cerebral infarction: Secondary | ICD-10-CM | POA: Diagnosis not present

## 2014-03-26 DIAGNOSIS — K5900 Constipation, unspecified: Secondary | ICD-10-CM | POA: Diagnosis not present

## 2014-03-26 DIAGNOSIS — R278 Other lack of coordination: Secondary | ICD-10-CM | POA: Diagnosis not present

## 2014-03-26 DIAGNOSIS — I10 Essential (primary) hypertension: Secondary | ICD-10-CM | POA: Diagnosis not present

## 2014-03-26 DIAGNOSIS — I639 Cerebral infarction, unspecified: Secondary | ICD-10-CM | POA: Diagnosis not present

## 2014-03-26 DIAGNOSIS — M6281 Muscle weakness (generalized): Secondary | ICD-10-CM | POA: Diagnosis not present

## 2014-03-26 DIAGNOSIS — R262 Difficulty in walking, not elsewhere classified: Secondary | ICD-10-CM | POA: Diagnosis not present

## 2014-03-26 DIAGNOSIS — R1312 Dysphagia, oropharyngeal phase: Secondary | ICD-10-CM | POA: Diagnosis not present

## 2014-03-26 DIAGNOSIS — K222 Esophageal obstruction: Secondary | ICD-10-CM | POA: Diagnosis not present

## 2014-03-26 DIAGNOSIS — E785 Hyperlipidemia, unspecified: Secondary | ICD-10-CM | POA: Diagnosis not present

## 2014-03-26 DIAGNOSIS — I69328 Other speech and language deficits following cerebral infarction: Secondary | ICD-10-CM | POA: Diagnosis not present

## 2014-03-26 DIAGNOSIS — K219 Gastro-esophageal reflux disease without esophagitis: Secondary | ICD-10-CM | POA: Diagnosis not present

## 2014-03-26 DIAGNOSIS — H04123 Dry eye syndrome of bilateral lacrimal glands: Secondary | ICD-10-CM | POA: Diagnosis not present

## 2014-03-26 DIAGNOSIS — H918X1 Other specified hearing loss, right ear: Secondary | ICD-10-CM | POA: Diagnosis not present

## 2014-04-08 ENCOUNTER — Non-Acute Institutional Stay (SKILLED_NURSING_FACILITY): Payer: Medicare Other | Admitting: Registered Nurse

## 2014-04-08 ENCOUNTER — Encounter: Payer: Self-pay | Admitting: Registered Nurse

## 2014-04-08 DIAGNOSIS — E785 Hyperlipidemia, unspecified: Secondary | ICD-10-CM | POA: Diagnosis not present

## 2014-04-08 DIAGNOSIS — I480 Paroxysmal atrial fibrillation: Secondary | ICD-10-CM

## 2014-04-08 DIAGNOSIS — I639 Cerebral infarction, unspecified: Secondary | ICD-10-CM | POA: Diagnosis not present

## 2014-04-08 DIAGNOSIS — K5900 Constipation, unspecified: Secondary | ICD-10-CM | POA: Diagnosis not present

## 2014-04-08 DIAGNOSIS — I10 Essential (primary) hypertension: Secondary | ICD-10-CM | POA: Diagnosis not present

## 2014-04-08 DIAGNOSIS — L97521 Non-pressure chronic ulcer of other part of left foot limited to breakdown of skin: Secondary | ICD-10-CM

## 2014-04-08 DIAGNOSIS — Z9181 History of falling: Secondary | ICD-10-CM

## 2014-04-08 DIAGNOSIS — K219 Gastro-esophageal reflux disease without esophagitis: Secondary | ICD-10-CM

## 2014-04-08 NOTE — Progress Notes (Signed)
Patient ID: Pamela [name], female   DOB: 09/27/1916, 81 y.o.   MRN: [id number]   Place of Service: [facility]  Allergies  Allergen Reactions  . Statins Other (See Comments)    unknown    Code Status: Full Code  Goals of Care: Longevity/STR  Chief Complaint  Patient presents with  . Discharge Note    HPI 81 y.o. female with PMH of HTN, HLD, fibromyalgia, paroxysmal afib, PAD among others is being seen for a  discharge visit. She was here for STR post hospital admission from 03/06/14 to 03/09/14 for acute right cerebral hemisphere watershed infarcts. She has worked well with therapy team and is ready to be charged to an ALF Therapist, art) and DME (rollator). Seen in bed today. No complaints verbalized. Niece at bedside   Review of Systems Constitutional: Negative for fever and chills.  HENT: Negative for ear pain, congestion, and sore throat. Positive for hearing impairment R>L  Eyes: Negative for eye pain and eye discharge  Cardiovascular: Negative for chest pain, palpitations, and leg swelling Respiratory: Negative cough, shortness of breath, and wheezing.  Gastrointestinal: Negative for nausea and vomiting. Negative for abdominal pain, diarrhea and constipation.  Genitourinary: Negative for dysuria and hematuria Endocrine: Negative for polydipsia, polyphagia, and polyuria Musculoskeletal: Negative for back pain, joint pain, and joint swelling  Neurological: Negative for dizziness and headache. Positive for muscle weankess Skin: Negative for rash. Positive for small wound on Left foot Psychiatric: Negative for depression  Past Medical History  Diagnosis Date  . Hyperlipidemia   . Hypertension   . Fibromyalgia   . Thyroid disease     hyperparathyroid  . PAD (peripheral artery disease)   . TIA (transient ischemic attack) ~ 2009    "only 1 that I know of" (12/13/2011)  . Atrial fibrillation     Not on Coumadin. Noted during 02/2009 admission.   . Skin cancer    Unknown types  . Schatzki's ring     Non obstructing ring on EGD 02/2009   . Pancreatic mass 02/2009    Abnormal CT abdomen, with cystic area in head and circumferential thickening of midbody of stomach  . Fibula fracture 12/11/2011    left  . Stroke 2006    "drags right leg very slightly" (12/13/2011)  . GERD (gastroesophageal reflux disease)   . H/O hiatal hernia   . Hard of hearing     "totally deaf in the right; 50% is gone in the left" (12/13/2011)    Past Surgical History  Procedure Laterality Date  . Appendectomy  1940  . Parathyroidectomy  1980  . Vaginal hysterectomy  1970    History  Substance Use Topics  . Smoking status: Never Smoker   . Smokeless tobacco: Never Used  . Alcohol Use: No    Family History  Problem Relation Age of Onset  . Cancer Other     breast ca  . Cancer      mouth ca       Medication List       This list is accurate as of: 04/08/14  6:01 PM.  Always use your most recent med list.               amLODipine 10 MG tablet  Commonly known as:  NORVASC  Take 5 mg by mouth daily.     apixaban 2.5 MG Tabs tablet  Commonly known as:  ELIQUIS  Take 1 tablet (2.5 mg total) by mouth every 12 (twelve)  hours.     calcium carbonate 600 MG Tabs tablet  Commonly known as:  OS-CAL  Take 600 mg by mouth daily.     CENTRUM SILVER PO  Take 1 tablet by mouth daily.     ezetimibe 10 MG tablet  Commonly known as:  ZETIA  Take 1 tablet (10 mg total) by mouth daily.     losartan 100 MG tablet  Commonly known as:  COZAAR  take 1 tablet by mouth daily     magnesium oxide 400 (241.3 MG) MG tablet  Commonly known as:  MAG-OX  take 1 tablet by mouth once daily     metoprolol tartrate 25 MG tablet  Commonly known as:  LOPRESSOR  take 1 tablet by mouth at bedtime     ranitidine 150 MG tablet  Commonly known as:  ZANTAC  Take 150 mg by mouth 2 (two) times daily.     RESTASIS 0.05 % ophthalmic emulsion  Generic drug:  cycloSPORINE  Place 1  drop into both eyes 2 (two) times daily.     senna 8.6 MG tablet  Commonly known as:  SENOKOT  Take 1 tablet by mouth daily. hold for loose bowels     urea 40 % Crea  Commonly known as:  CARMOL  Apply bid to heel, disp 30 grams     vitamin C 500 MG tablet  Commonly known as:  ASCORBIC ACID  Take 500 mg by mouth daily.        Physical Exam  BP 125/64 mmHg  Pulse 65  Temp(Src) 97.5 F (36.4 C)  Resp 10  Ht 5\' 4"  (1.626 m)  Wt 137 lb 3.2 oz (62.234 kg)  BMI 23.54 kg/m2  Constitutional: thin elderly female in no acute distress. Conversant and pleasant HEENT: Normocephalic and atraumatic. PERRL. EOM intact. No icterus. Eyeglasses in place. Left ear hearing aid in place. Has hearing impairment. No nasal discharge or sinus tenderness. Oral mucosa moist. Posterior pharynx clear of any exudate or lesions. Teeth and gingiva in good general condition.  Neck: Supple and nontender. No lymphadenopathy, masses, or thyromegaly. No JVD or carotid bruits. Cardiac: Normal S1, S2. Irregularly irregular without appreciable murmurs, rubs, or gallops. Distal pulses intact. trace dependent edema.  Lungs: No respiratory distress. Breath sounds clear bilaterally without rales, rhonchi, or wheezes. Abdomen: Audible bowel sounds in all quadrants. Soft, nontender, nondistended. Musculoskeletal: able to move all extremities. Generalized muscle weakness. No joint erythema or tenderness. Skin: Warm and dry. Shallow venous ulcer 0.6x0.3cm noted on medial dorsal aspect of left foot w/o signs of infection.  Neurological: Alert and oriented to self. Psychiatric: Appropriate mood and affect.   Labs Reviewed  CBC Latest Ref Rng 03/08/2014 03/06/2014 03/05/2014  WBC 4.0 - 10.5 K/uL 5.4 5.8 6.1  Hemoglobin 12.0 - 15.0 g/dL 13.6 13.6 13.1  Hematocrit 36.0 - 46.0 % 43.2 41.9 39.8  Platelets 150 - 400 K/uL 202 188 206    CMP Latest Ref Rng 03/08/2014 03/06/2014 03/05/2014  Glucose 70 - 99 mg/dL 129(H) 93  101(H)  BUN 6 - 23 mg/dL 11 21 29(H)  Creatinine 0.50 - 1.10 mg/dL 0.65 0.72 0.79  Sodium 137 - 147 mEq/L 144 139 141  Potassium 3.7 - 5.3 mEq/L 4.1 4.5 4.2  Chloride 96 - 112 mEq/L 106 99 101  CO2 19 - 32 mEq/L 27 28 27   Calcium 8.4 - 10.5 mg/dL 9.2 9.6 10.0  Total Protein 6.0 - 8.3 g/dL - 6.9 7.1  Total Bilirubin 0.3 -  1.2 mg/dL - 0.5 0.4  Alkaline Phos 39 - 117 U/L - 80 79  AST 0 - 37 U/L - 32 29  ALT 0 - 35 U/L - 19 18    Lab Results  Component Value Date   HGBA1C 5.5 03/07/2014    Lab Results  Component Value Date   TSH 1.05 01/08/2014    Lipid Panel     Component Value Date/Time   CHOL 209* 03/07/2014 0540   TRIG 195* 03/07/2014 0540   TRIG 184 03/20/2009   HDL 41 03/07/2014 0540   CHOLHDL 5.1 03/07/2014 0540   VLDL 39 03/07/2014 0540   LDLCALC 129* 03/07/2014 0540   LDLDIRECT 181.8 03/03/2014 0928    Diagnostic Studies Reviewed 03/05/14-Head CT: No acute intracranial process. Involutional changes. Moderate to severe white matter changes suggest chronic small vessel ischemic disease. Remote left thalamus lacunar infarct.   03/06/14-MRI: 1. Acute right cerebral hemisphere watershed infarcts. 2. Abnormal appearance of the distal right internal carotid artery flow void, which may reflect high-grade stenosis or occlusion in the Neck. 3. Moderate chronic small vessel ischemic disease and chronic lacunar infarcts as above  Assessment & Plan 1. Essential hypertension Stable. BP range 110s-130s/60s. Continue norvasc 10mg  daily, losartan 100mg  daily, and lopressor 25mg  daily. F/u with PCP  2. Paroxysmal atrial fibrillation Stable. Rate-controlled. Continue lopressor 25mg  daily and eliquis 2.5mg  twice daily.   3. Constipation, unspecified constipation type Continue senna daily. Encourage hydration  4. Hyperlipidemia LDL 129. Documented allergy to statin. Continue zetia 10mg  daily  5. Foot ulceration, left, limited to breakdown of skin (venous) Improving. Continue  to clean wound with NS and apply collagen to stimulate granulation, and cover with dry dressing every three days.   6. Right hemisphere, cerebral infarction Stable. Continue eliquis 2.5mg  twice daily for secondary stroke prevention. ALF to assess for PT/OT. Fall risk precautions. Continue to f/u with neurology   7. Gastroesophageal reflux disease, esophagitis presence not specified Continue zantac 150mg  twice daily   8. Hx of Fall Continue fall risk precautions   Home health services: ALF to assess for PT/OT DME required: [facility] PCP follow-up: Arnette Norris on 04/20/14 @ 12:15pm 30-day supply of prescription medications provided.  Time spent: 35 minutes on counseling and care coordination   Family/Staff Communication Plan of care discussed with patient, family, and nursing staff. Patient, family, and nursing staff verbalized understanding and agree with plan of care. No additional questions or concerns reported.    Arthur Holms, MSN, AGNP-C [facility] [address] [place], [place] [address] [phone] [8am-5pm] After hours: [phone]

## 2014-04-20 ENCOUNTER — Encounter: Payer: Self-pay | Admitting: Family Medicine

## 2014-04-20 ENCOUNTER — Ambulatory Visit (INDEPENDENT_AMBULATORY_CARE_PROVIDER_SITE_OTHER): Payer: Medicare Other | Admitting: Family Medicine

## 2014-04-20 VITALS — BP 120/64 | HR 72 | Temp 97.6°F | Wt 126.2 lb

## 2014-04-20 DIAGNOSIS — I639 Cerebral infarction, unspecified: Secondary | ICD-10-CM | POA: Diagnosis not present

## 2014-04-20 DIAGNOSIS — I480 Paroxysmal atrial fibrillation: Secondary | ICD-10-CM

## 2014-04-20 NOTE — Assessment & Plan Note (Signed)
>  25 minutes spent in face to face time with patient, >50% spent in counselling or coordination of care Marcie Bal, Ms. Jacques and I agreed that she would likely be more relaxed if she established care with MD at ALF rather than having to come here- getting transportation and coming here was quite stressful for her.  We also discussed eliquis- given her history of falls and irreversible nature of eliquis, I encouraged them to talk with neuro about other options- weighing her age and fall risk into the equation. The patient and her niece indicate understanding of these issues and agrees with the plan.

## 2014-04-20 NOTE — Progress Notes (Signed)
Pre visit review using our clinic review tool, if applicable. No additional management support is needed unless otherwise documented below in the visit note. 

## 2014-04-20 NOTE — Progress Notes (Signed)
Subjective:   Patient ID: Pamela [name], female    DOB: 09-Sep-1916, 81 y.o.   MRN: [id number]  Pamela [name] is a pleasant 81 y.o. year old female who presents to clinic today with her niece, Marcie Bal, for Follow-up  on 04/20/2014  HPI:   Discharged from [facility] on 04/08/2014 s/p skilled rehab post hospitalization for acute right cerebral hemisphere watershed infarcts.  Discharged to ALF- [facility] with DME, a rollator.  She is now at [facility].  Hospital and SNF notes reviewed.  Paraxoysmal a fib- has been rate controlled on lopressor, on eliquis for anticoagulation.  Ms. Grussing feels ok- having more tremors and memory loss.  Settling into homeplace ok- "everyone seems nice."  She did unfortunately fall several times while at [facility].  Current Outpatient Prescriptions on File Prior to Visit  Medication Sig Dispense Refill  . amLODipine (NORVASC) 10 MG tablet Take 5 mg by mouth daily.    Marland Kitchen apixaban (ELIQUIS) 2.5 MG TABS tablet Take 1 tablet (2.5 mg total) by mouth every 12 (twelve) hours. 60 tablet 0  . Ascorbic Acid (VITAMIN C) 500 MG tablet Take 500 mg by mouth daily.      . calcium carbonate (OS-CAL) 600 MG TABS Take 600 mg by mouth daily.      Marland Kitchen ezetimibe (ZETIA) 10 MG tablet Take 1 tablet (10 mg total) by mouth daily. 30 tablet 3  . losartan (COZAAR) 100 MG tablet take 1 tablet by mouth daily (Patient taking differently: Take 100 mg by mouth daily. take 1 tablet by mouth daily) 90 tablet 3  . magnesium oxide (MAG-OX) 400 (241.3 MG) MG tablet take 1 tablet by mouth once daily 30 tablet 5  . metoprolol tartrate (LOPRESSOR) 25 MG tablet take 1 tablet by mouth at bedtime (Patient taking differently: Take 25 mg by mouth at bedtime. ) 90 tablet 3  . Multiple Vitamins-Minerals (CENTRUM SILVER PO) Take 1 tablet by mouth daily.      . ranitidine (ZANTAC) 150 MG tablet Take 150 mg by mouth 2 (two) times daily.    . RESTASIS 0.05 % ophthalmic emulsion Place 1 drop into both eyes 2  (two) times daily.  0  . senna (SENOKOT) 8.6 MG tablet Take 1 tablet by mouth daily. hold for loose bowels    . urea (CARMOL) 40 % CREA Apply bid to heel, disp 30 grams (Patient not taking: Reported on 04/20/2014) 30 each 4   No current facility-administered medications on file prior to visit.    Allergies  Allergen Reactions  . Statins Other (See Comments)    unknown    Past Medical History  Diagnosis Date  . Hyperlipidemia   . Hypertension   . Fibromyalgia   . Thyroid disease     hyperparathyroid  . PAD (peripheral artery disease)   . TIA (transient ischemic attack) ~ 2009    "only 1 that I know of" (12/13/2011)  . Atrial fibrillation     Not on Coumadin. Noted during 02/2009 admission.   . Skin cancer     Unknown types  . Schatzki's ring     Non obstructing ring on EGD 02/2009   . Pancreatic mass 02/2009    Abnormal CT abdomen, with cystic area in head and circumferential thickening of midbody of stomach  . Fibula fracture 12/11/2011    left  . Stroke 2006    "drags right leg very slightly" (12/13/2011)  . GERD (gastroesophageal reflux disease)   . H/O hiatal hernia   .  Hard of hearing     "totally deaf in the right; 50% is gone in the left" (12/13/2011)    Past Surgical History  Procedure Laterality Date  . Appendectomy  1940  . Parathyroidectomy  1980  . Vaginal hysterectomy  1970    Family History  Problem Relation Age of Onset  . Cancer Other     breast ca  . Cancer      mouth ca     History   Social History  . Marital Status: Married    Spouse Name: N/A    Number of Children: N/A  . Years of Education: N/A   Occupational History  . Not on file.   Social History Main Topics  . Smoking status: Never Smoker   . Smokeless tobacco: Never Used  . Alcohol Use: No  . Drug Use: No  . Sexual Activity: No   Other Topics Concern  . Not on file   Social History Narrative   Still living at home alone, has had 2 husbands, both passed away, last in  07/20/98. Has great niece Marcie Bal who is Power of Attorney [phone].  She is full code. Large family. Still active, still does her yardwork, no cane no walker.                The PMH, PSH, Social History, Family History, Medications, and allergies have been reviewed in [facility], and have been updated if relevant.     Review of Systems  Constitutional: Positive for unexpected weight change. Negative for diaphoresis and fatigue.  HENT: Negative.   Respiratory: Negative.   Cardiovascular: Negative.   Gastrointestinal: Negative.   Endocrine: Negative.   Genitourinary: Negative.   Musculoskeletal: Negative.   Skin: Negative.   Allergic/Immunologic: Negative.   Neurological: Negative.   Psychiatric/Behavioral: Positive for confusion.  All other systems reviewed and are negative.      Objective:    BP 120/64 mmHg  Pulse 72  Temp(Src) 97.6 F (36.4 C) (Oral)  Wt 126 lb 4 oz (57.267 kg)  Wt Readings from Last 3 Encounters:  04/20/14 126 lb 4 oz (57.267 kg)  04/08/14 137 lb 3.2 oz (62.234 kg)  03/10/14 128 lb (58.06 kg)    Physical Exam  Constitutional: She is oriented to person, place, and time. She appears well-developed and well-nourished. No distress.  In wheelchair  HENT:  Hard of hearing- baseline  Eyes: Conjunctivae are normal.  Neck: Normal range of motion.  Cardiovascular: An irregularly irregular rhythm present.  Pulmonary/Chest: Effort normal and breath sounds normal.  Musculoskeletal: Normal range of motion.  Neurological: She is alert and oriented to person, place, and time. No cranial nerve deficit.  Skin: Skin is warm and dry.  Psychiatric: She has a normal mood and affect. Her behavior is normal. Judgment and thought content normal.  Nursing note and vitals reviewed.         Assessment & Plan:   Paroxysmal atrial fibrillation  Cerebral infarction due to unspecified mechanism No Follow-up on file.

## 2014-04-26 ENCOUNTER — Ambulatory Visit: Payer: Self-pay | Admitting: Internal Medicine

## 2014-04-27 DIAGNOSIS — S72401A Unspecified fracture of lower end of right femur, initial encounter for closed fracture: Secondary | ICD-10-CM | POA: Diagnosis not present

## 2014-04-27 DIAGNOSIS — M25561 Pain in right knee: Secondary | ICD-10-CM | POA: Diagnosis not present

## 2014-04-27 DIAGNOSIS — W19XXXA Unspecified fall, initial encounter: Secondary | ICD-10-CM | POA: Diagnosis not present

## 2014-04-27 DIAGNOSIS — R9431 Abnormal electrocardiogram [ECG] [EKG]: Secondary | ICD-10-CM | POA: Diagnosis not present

## 2014-04-27 DIAGNOSIS — R609 Edema, unspecified: Secondary | ICD-10-CM | POA: Diagnosis not present

## 2014-04-28 ENCOUNTER — Telehealth: Payer: Self-pay | Admitting: Family Medicine

## 2014-04-28 ENCOUNTER — Inpatient Hospital Stay: Payer: Self-pay | Admitting: Internal Medicine

## 2014-04-28 DIAGNOSIS — R531 Weakness: Secondary | ICD-10-CM | POA: Diagnosis not present

## 2014-04-28 DIAGNOSIS — Z01818 Encounter for other preprocedural examination: Secondary | ICD-10-CM | POA: Diagnosis not present

## 2014-04-28 DIAGNOSIS — J690 Pneumonitis due to inhalation of food and vomit: Secondary | ICD-10-CM | POA: Diagnosis not present

## 2014-04-28 DIAGNOSIS — Z9181 History of falling: Secondary | ICD-10-CM | POA: Diagnosis not present

## 2014-04-28 DIAGNOSIS — J188 Other pneumonia, unspecified organism: Secondary | ICD-10-CM | POA: Diagnosis not present

## 2014-04-28 DIAGNOSIS — I6340 Cerebral infarction due to embolism of unspecified cerebral artery: Secondary | ICD-10-CM | POA: Diagnosis not present

## 2014-04-28 DIAGNOSIS — J90 Pleural effusion, not elsewhere classified: Secondary | ICD-10-CM | POA: Diagnosis not present

## 2014-04-28 DIAGNOSIS — I4891 Unspecified atrial fibrillation: Secondary | ICD-10-CM | POA: Diagnosis not present

## 2014-04-28 DIAGNOSIS — S72401A Unspecified fracture of lower end of right femur, initial encounter for closed fracture: Secondary | ICD-10-CM | POA: Diagnosis not present

## 2014-04-28 DIAGNOSIS — R9431 Abnormal electrocardiogram [ECG] [EKG]: Secondary | ICD-10-CM | POA: Diagnosis not present

## 2014-04-28 DIAGNOSIS — E785 Hyperlipidemia, unspecified: Secondary | ICD-10-CM | POA: Diagnosis not present

## 2014-04-28 DIAGNOSIS — S72461D Displaced supracondylar fracture with intracondylar extension of lower end of right femur, subsequent encounter for closed fracture with routine healing: Secondary | ICD-10-CM | POA: Diagnosis not present

## 2014-04-28 DIAGNOSIS — I639 Cerebral infarction, unspecified: Secondary | ICD-10-CM | POA: Diagnosis not present

## 2014-04-28 DIAGNOSIS — K5790 Diverticulosis of intestine, part unspecified, without perforation or abscess without bleeding: Secondary | ICD-10-CM | POA: Diagnosis not present

## 2014-04-28 DIAGNOSIS — S72001A Fracture of unspecified part of neck of right femur, initial encounter for closed fracture: Secondary | ICD-10-CM | POA: Diagnosis not present

## 2014-04-28 DIAGNOSIS — L89899 Pressure ulcer of other site, unspecified stage: Secondary | ICD-10-CM | POA: Diagnosis not present

## 2014-04-28 DIAGNOSIS — I69351 Hemiplegia and hemiparesis following cerebral infarction affecting right dominant side: Secondary | ICD-10-CM | POA: Diagnosis not present

## 2014-04-28 DIAGNOSIS — Z7902 Long term (current) use of antithrombotics/antiplatelets: Secondary | ICD-10-CM | POA: Diagnosis not present

## 2014-04-28 DIAGNOSIS — I5031 Acute diastolic (congestive) heart failure: Secondary | ICD-10-CM | POA: Diagnosis not present

## 2014-04-28 DIAGNOSIS — I340 Nonrheumatic mitral (valve) insufficiency: Secondary | ICD-10-CM | POA: Diagnosis not present

## 2014-04-28 DIAGNOSIS — S0990XA Unspecified injury of head, initial encounter: Secondary | ICD-10-CM | POA: Diagnosis not present

## 2014-04-28 DIAGNOSIS — R918 Other nonspecific abnormal finding of lung field: Secondary | ICD-10-CM | POA: Diagnosis not present

## 2014-04-28 DIAGNOSIS — L89302 Pressure ulcer of unspecified buttock, stage 2: Secondary | ICD-10-CM | POA: Diagnosis not present

## 2014-04-28 DIAGNOSIS — R1312 Dysphagia, oropharyngeal phase: Secondary | ICD-10-CM | POA: Diagnosis not present

## 2014-04-28 DIAGNOSIS — M6281 Muscle weakness (generalized): Secondary | ICD-10-CM | POA: Diagnosis not present

## 2014-04-28 DIAGNOSIS — J9601 Acute respiratory failure with hypoxia: Secondary | ICD-10-CM | POA: Diagnosis not present

## 2014-04-28 DIAGNOSIS — Z993 Dependence on wheelchair: Secondary | ICD-10-CM | POA: Diagnosis not present

## 2014-04-28 DIAGNOSIS — I10 Essential (primary) hypertension: Secondary | ICD-10-CM | POA: Diagnosis not present

## 2014-04-28 DIAGNOSIS — S9001XA Contusion of right ankle, initial encounter: Secondary | ICD-10-CM | POA: Diagnosis not present

## 2014-04-28 DIAGNOSIS — E46 Unspecified protein-calorie malnutrition: Secondary | ICD-10-CM | POA: Diagnosis not present

## 2014-04-28 DIAGNOSIS — R410 Disorientation, unspecified: Secondary | ICD-10-CM | POA: Diagnosis not present

## 2014-04-28 DIAGNOSIS — F0390 Unspecified dementia without behavioral disturbance: Secondary | ICD-10-CM | POA: Diagnosis not present

## 2014-04-28 DIAGNOSIS — R400 Somnolence: Secondary | ICD-10-CM | POA: Diagnosis not present

## 2014-04-28 DIAGNOSIS — S72461A Displaced supracondylar fracture with intracondylar extension of lower end of right femur, initial encounter for closed fracture: Secondary | ICD-10-CM | POA: Diagnosis not present

## 2014-04-28 DIAGNOSIS — Z4789 Encounter for other orthopedic aftercare: Secondary | ICD-10-CM | POA: Diagnosis not present

## 2014-04-28 DIAGNOSIS — Z79899 Other long term (current) drug therapy: Secondary | ICD-10-CM | POA: Diagnosis not present

## 2014-04-28 DIAGNOSIS — S72491A Other fracture of lower end of right femur, initial encounter for closed fracture: Secondary | ICD-10-CM | POA: Diagnosis not present

## 2014-04-28 DIAGNOSIS — S72421A Displaced fracture of lateral condyle of right femur, initial encounter for closed fracture: Secondary | ICD-10-CM | POA: Diagnosis not present

## 2014-04-28 DIAGNOSIS — R1310 Dysphagia, unspecified: Secondary | ICD-10-CM | POA: Diagnosis not present

## 2014-04-28 DIAGNOSIS — R278 Other lack of coordination: Secondary | ICD-10-CM | POA: Diagnosis not present

## 2014-04-28 DIAGNOSIS — I69328 Other speech and language deficits following cerebral infarction: Secondary | ICD-10-CM | POA: Diagnosis not present

## 2014-04-28 DIAGNOSIS — J189 Pneumonia, unspecified organism: Secondary | ICD-10-CM | POA: Diagnosis not present

## 2014-04-28 DIAGNOSIS — I480 Paroxysmal atrial fibrillation: Secondary | ICD-10-CM | POA: Diagnosis not present

## 2014-04-28 DIAGNOSIS — I69354 Hemiplegia and hemiparesis following cerebral infarction affecting left non-dominant side: Secondary | ICD-10-CM | POA: Diagnosis not present

## 2014-04-28 DIAGNOSIS — S72401D Unspecified fracture of lower end of right femur, subsequent encounter for closed fracture with routine healing: Secondary | ICD-10-CM | POA: Diagnosis not present

## 2014-04-28 DIAGNOSIS — S199XXA Unspecified injury of neck, initial encounter: Secondary | ICD-10-CM | POA: Diagnosis not present

## 2014-04-28 DIAGNOSIS — I69159 Hemiplegia and hemiparesis following nontraumatic intracerebral hemorrhage affecting unspecified side: Secondary | ICD-10-CM | POA: Diagnosis not present

## 2014-04-28 DIAGNOSIS — R5383 Other fatigue: Secondary | ICD-10-CM | POA: Diagnosis not present

## 2014-04-28 LAB — CBC
HCT: 42.9 % (ref 35.0–47.0)
HGB: 13.9 g/dL (ref 12.0–16.0)
MCH: 30.2 pg (ref 26.0–34.0)
MCHC: 32.4 g/dL (ref 32.0–36.0)
MCV: 93 fL (ref 80–100)
Platelet: 245 10*3/uL (ref 150–440)
RBC: 4.61 10*6/uL (ref 3.80–5.20)
RDW: 13.8 % (ref 11.5–14.5)
WBC: 9.3 10*3/uL (ref 3.6–11.0)

## 2014-04-28 LAB — URINALYSIS, COMPLETE
BILIRUBIN, UR: NEGATIVE
Bacteria: NONE SEEN
Blood: NEGATIVE
Glucose,UR: 50 mg/dL (ref 0–75)
Ketone: NEGATIVE
Leukocyte Esterase: NEGATIVE
Nitrite: NEGATIVE
PH: 8 (ref 4.5–8.0)
PROTEIN: NEGATIVE
SPECIFIC GRAVITY: 1.009 (ref 1.003–1.030)
WBC UR: 2 /HPF (ref 0–5)

## 2014-04-28 LAB — BASIC METABOLIC PANEL
Anion Gap: 8 (ref 7–16)
BUN: 20 mg/dL — ABNORMAL HIGH (ref 7–18)
CHLORIDE: 103 mmol/L (ref 98–107)
CREATININE: 0.95 mg/dL (ref 0.60–1.30)
Calcium, Total: 8.7 mg/dL (ref 8.5–10.1)
Co2: 30 mmol/L (ref 21–32)
EGFR (African American): >60
GFR CALC NON AF AMER: 58 — AB
Glucose: 132 mg/dL — ABNORMAL HIGH (ref 65–99)
Osmolality: 286 (ref 275–301)
Potassium: 4 mmol/L (ref 3.5–5.1)
SODIUM: 141 mmol/L (ref 136–145)

## 2014-04-28 LAB — APTT: Activated PTT: 28.9 secs (ref 23.6–35.9)

## 2014-04-28 LAB — PROTIME-INR
INR: 1.1
Prothrombin Time: 14 secs

## 2014-04-28 LAB — TROPONIN I: TROPONIN-I: 0.04 ng/mL

## 2014-04-28 NOTE — Telephone Encounter (Signed)
Noted. Thanks for the update.  

## 2014-04-28 NOTE — Telephone Encounter (Signed)
PLEASE NOTE: All timestamps contained within this report are represented as [place] Standard Time. CONFIDENTIALTY NOTICE: This fax transmission is intended only for the addressee. It contains information that is legally privileged, confidential or otherwise protected from use or disclosure. If you are not the intended recipient, you are strictly prohibited from reviewing, disclosing, copying using or disseminating any of this information or taking any action in reliance on or regarding this information. If you have received this fax in error, please notify us immediately by telephone so that we can arrange for its return to [place]. Phone: [phone], Toll-Free: [phone], Fax: [phone] Page: 1 of 1 Call Id: [id number] [place] Patient Name: Pamela [name] Gender: Female DOB: 1916-09-29 Age: 81 Y 16 M 16 D Return Phone Number: Address: City/State/Zip: [place] Client [place] Night - Client Client Site [place] [facility] [place], [place] Type Call Caller Name Caryl Pina w/ [place] Phone Number [phone] Relationship To Patient Provider Is this call to report lab results? No Call Type General Information Initial Comment Caller, Gwendolyn Fill of [place] @ [phone], states pt had a fall, hit her head w/ no visible marks. Injury to right knee w/ immediate swelling. Message only, no call back needed. General Information Type Message Only Nurse Assessment Guidelines Guideline Title Affirmed Question Affirmed Notes Nurse Date/Time (Eastern Time) Disp. Time Eilene Ghazi Time) Disposition Final User 04/27/2014 11:33:20 PM General Information Provided Yes Clydene Laming Amy After Care Instructions Given Call Event Type User Date / Time Description

## 2014-04-28 NOTE — Telephone Encounter (Signed)
PLEASE NOTE: All timestamps contained within this report are represented as [place] Standard Time. CONFIDENTIALTY NOTICE: This fax transmission is intended only for the addressee. It contains information that is legally privileged, confidential or otherwise protected from use or disclosure. If you are not the intended recipient, you are strictly prohibited from reviewing, disclosing, copying using or disseminating any of this information or taking any action in reliance on or regarding this information. If you have received this fax in error, please notify us immediately by telephone so that we can arrange for its return to [place]. Phone: [phone], Toll-Free: [phone], Fax: [phone] Page: 1 of 1 Call Id: [id number] [place] Patient Name: Pamela [name] Gender: Female DOB: 1917-02-21 Age: 50 Y 73 M 16 D Return Phone Number: Address: City/State/Zip: [place] Client [place] Night - Client Client Site [place] [facility] [place], [place] Type Call Caller Name Caryl Pina w/ [place] Phone Number [phone] Relationship To Patient Provider Is this call to report lab results? No Call Type General Information Initial Comment Caller, Pamela [name] of [place] @ [phone], states pt had a fall, hit her head w/ no visible marks. Injury to right knee w/ immediate swelling. Message only, no call back needed. General Information Type Message Only Nurse Assessment Guidelines Guideline Title Affirmed Question Affirmed Notes Nurse Date/Time (Eastern Time) Disp. Time Eilene Ghazi Time) Disposition Final User 04/27/2014 11:33:20 PM General Information Provided Yes Clydene Laming Amy After Care Instructions Given Call Event Type User Date / Time Description

## 2014-04-29 LAB — CBC WITH DIFFERENTIAL/PLATELET
BASOS ABS: 0 10*3/uL (ref 0.0–0.1)
Basophil %: 0.5 %
Eosinophil #: 0.2 10*3/uL (ref 0.0–0.7)
Eosinophil %: 2 %
HCT: 32.9 % — AB (ref 35.0–47.0)
HGB: 10.7 g/dL — ABNORMAL LOW (ref 12.0–16.0)
Lymphocyte #: 1.5 10*3/uL (ref 1.0–3.6)
Lymphocyte %: 17.4 %
MCH: 30.1 pg (ref 26.0–34.0)
MCHC: 32.5 g/dL (ref 32.0–36.0)
MCV: 93 fL (ref 80–100)
MONO ABS: 1.3 x10 3/mm — AB (ref 0.2–0.9)
MONOS PCT: 14.8 %
NEUTROS ABS: 5.6 10*3/uL (ref 1.4–6.5)
Neutrophil %: 65.3 %
Platelet: 202 10*3/uL (ref 150–440)
RBC: 3.54 10*6/uL — ABNORMAL LOW (ref 3.80–5.20)
RDW: 13.7 % (ref 11.5–14.5)
WBC: 8.7 10*3/uL (ref 3.6–11.0)

## 2014-04-29 LAB — BASIC METABOLIC PANEL
Anion Gap: 6 — ABNORMAL LOW (ref 7–16)
BUN: 27 mg/dL — AB (ref 7–18)
CALCIUM: 8.7 mg/dL (ref 8.5–10.1)
Chloride: 101 mmol/L (ref 98–107)
Co2: 30 mmol/L (ref 21–32)
Creatinine: 0.91 mg/dL (ref 0.60–1.30)
EGFR (African American): >60
EGFR (Non-African Amer.): >60
Glucose: 107 mg/dL — ABNORMAL HIGH (ref 65–99)
Osmolality: 279 (ref 275–301)
POTASSIUM: 4.1 mmol/L (ref 3.5–5.1)
Sodium: 137 mmol/L (ref 136–145)

## 2014-04-29 LAB — MAGNESIUM: Magnesium: 2.1 mg/dL

## 2014-04-30 LAB — CBC WITH DIFFERENTIAL/PLATELET
BASOS PCT: 0.4 %
Basophil #: 0 10*3/uL (ref 0.0–0.1)
Eosinophil #: 0.1 10*3/uL (ref 0.0–0.7)
Eosinophil %: 0.8 %
HCT: 30.8 % — ABNORMAL LOW (ref 35.0–47.0)
HGB: 9.9 g/dL — ABNORMAL LOW (ref 12.0–16.0)
Lymphocyte #: 1.7 10*3/uL (ref 1.0–3.6)
Lymphocyte %: 18.3 %
MCH: 30 pg (ref 26.0–34.0)
MCHC: 32.1 g/dL (ref 32.0–36.0)
MCV: 93 fL (ref 80–100)
MONOS PCT: 14 %
Monocyte #: 1.3 x10 3/mm — ABNORMAL HIGH (ref 0.2–0.9)
NEUTROS PCT: 66.5 %
Neutrophil #: 6.2 10*3/uL (ref 1.4–6.5)
Platelet: 180 10*3/uL (ref 150–440)
RBC: 3.3 10*6/uL — AB (ref 3.80–5.20)
RDW: 13.3 % (ref 11.5–14.5)
WBC: 9.4 10*3/uL (ref 3.6–11.0)

## 2014-04-30 LAB — BASIC METABOLIC PANEL
ANION GAP: 4 — AB (ref 7–16)
BUN: 24 mg/dL — ABNORMAL HIGH (ref 7–18)
CALCIUM: 8.7 mg/dL (ref 8.5–10.1)
CO2: 29 mmol/L (ref 21–32)
Chloride: 99 mmol/L (ref 98–107)
Creatinine: 0.77 mg/dL (ref 0.60–1.30)
GLUCOSE: 112 mg/dL — AB (ref 65–99)
OSMOLALITY: 269 (ref 275–301)
Potassium: 4.4 mmol/L (ref 3.5–5.1)
Sodium: 132 mmol/L — ABNORMAL LOW (ref 136–145)

## 2014-05-01 DIAGNOSIS — I10 Essential (primary) hypertension: Secondary | ICD-10-CM | POA: Diagnosis not present

## 2014-05-01 DIAGNOSIS — I480 Paroxysmal atrial fibrillation: Secondary | ICD-10-CM | POA: Diagnosis not present

## 2014-05-01 DIAGNOSIS — R410 Disorientation, unspecified: Secondary | ICD-10-CM | POA: Diagnosis not present

## 2014-05-01 LAB — CBC WITH DIFFERENTIAL/PLATELET
BASOS ABS: 0 10*3/uL (ref 0.0–0.1)
Basophil %: 0.1 %
EOS ABS: 0 10*3/uL (ref 0.0–0.7)
Eosinophil %: 0.1 %
HCT: 29.6 % — AB (ref 35.0–47.0)
HGB: 9.5 g/dL — ABNORMAL LOW (ref 12.0–16.0)
LYMPHS PCT: 8.1 %
Lymphocyte #: 0.9 10*3/uL — ABNORMAL LOW (ref 1.0–3.6)
MCH: 29.9 pg (ref 26.0–34.0)
MCHC: 32.1 g/dL (ref 32.0–36.0)
MCV: 93 fL (ref 80–100)
Monocyte #: 1.9 x10 3/mm — ABNORMAL HIGH (ref 0.2–0.9)
Monocyte %: 16.9 %
NEUTROS PCT: 74.8 %
Neutrophil #: 8.3 10*3/uL — ABNORMAL HIGH (ref 1.4–6.5)
PLATELETS: 158 10*3/uL (ref 150–440)
RBC: 3.18 10*6/uL — ABNORMAL LOW (ref 3.80–5.20)
RDW: 13.2 % (ref 11.5–14.5)
WBC: 11.1 10*3/uL — AB (ref 3.6–11.0)

## 2014-05-01 LAB — BASIC METABOLIC PANEL
Anion Gap: 4 — ABNORMAL LOW (ref 7–16)
BUN: 22 mg/dL — ABNORMAL HIGH (ref 7–18)
CHLORIDE: 100 mmol/L (ref 98–107)
CO2: 31 mmol/L (ref 21–32)
CREATININE: 0.77 mg/dL (ref 0.60–1.30)
Calcium, Total: 8 mg/dL — ABNORMAL LOW (ref 8.5–10.1)
EGFR (African American): >60
EGFR (Non-African Amer.): >60
Glucose: 122 mg/dL — ABNORMAL HIGH (ref 65–99)
Osmolality: 275 (ref 275–301)
Potassium: 4.2 mmol/L (ref 3.5–5.1)
Sodium: 135 mmol/L — ABNORMAL LOW (ref 136–145)

## 2014-05-02 LAB — CBC WITH DIFFERENTIAL/PLATELET
Basophil #: 0 10*3/uL (ref 0.0–0.1)
Basophil %: 0.3 %
EOS ABS: 0 10*3/uL (ref 0.0–0.7)
EOS PCT: 0.2 %
HCT: 29.2 % — AB (ref 35.0–47.0)
HGB: 9.5 g/dL — ABNORMAL LOW (ref 12.0–16.0)
Lymphocyte #: 1.1 10*3/uL (ref 1.0–3.6)
Lymphocyte %: 9.3 %
MCH: 30.4 pg (ref 26.0–34.0)
MCHC: 32.4 g/dL (ref 32.0–36.0)
MCV: 94 fL (ref 80–100)
MONO ABS: 1.8 x10 3/mm — AB (ref 0.2–0.9)
Monocyte %: 15.8 %
Neutrophil #: 8.6 10*3/uL — ABNORMAL HIGH (ref 1.4–6.5)
Neutrophil %: 74.4 %
PLATELETS: 182 10*3/uL (ref 150–440)
RBC: 3.12 10*6/uL — ABNORMAL LOW (ref 3.80–5.20)
RDW: 13.3 % (ref 11.5–14.5)
WBC: 11.5 10*3/uL — AB (ref 3.6–11.0)

## 2014-05-03 DIAGNOSIS — I340 Nonrheumatic mitral (valve) insufficiency: Secondary | ICD-10-CM

## 2014-05-03 LAB — CBC WITH DIFFERENTIAL/PLATELET
Basophil #: 0 10*3/uL (ref 0.0–0.1)
Basophil %: 0.2 %
EOS ABS: 0 10*3/uL (ref 0.0–0.7)
Eosinophil %: 0 %
HCT: 27.3 % — ABNORMAL LOW (ref 35.0–47.0)
HGB: 8.8 g/dL — ABNORMAL LOW (ref 12.0–16.0)
LYMPHS ABS: 0.7 10*3/uL — AB (ref 1.0–3.6)
Lymphocyte %: 7.8 %
MCH: 30.1 pg (ref 26.0–34.0)
MCHC: 32.2 g/dL (ref 32.0–36.0)
MCV: 94 fL (ref 80–100)
MONO ABS: 1 x10 3/mm — AB (ref 0.2–0.9)
Monocyte %: 10.6 %
NEUTROS PCT: 81.4 %
Neutrophil #: 7.7 10*3/uL — ABNORMAL HIGH (ref 1.4–6.5)
PLATELETS: 214 10*3/uL (ref 150–440)
RBC: 2.92 10*6/uL — ABNORMAL LOW (ref 3.80–5.20)
RDW: 13.5 % (ref 11.5–14.5)
WBC: 9.5 10*3/uL (ref 3.6–11.0)

## 2014-05-07 DIAGNOSIS — E870 Hyperosmolality and hypernatremia: Secondary | ICD-10-CM | POA: Diagnosis not present

## 2014-05-07 DIAGNOSIS — I10 Essential (primary) hypertension: Secondary | ICD-10-CM | POA: Diagnosis not present

## 2014-05-07 DIAGNOSIS — E213 Hyperparathyroidism, unspecified: Secondary | ICD-10-CM | POA: Diagnosis not present

## 2014-05-07 DIAGNOSIS — Y95 Nosocomial condition: Secondary | ICD-10-CM | POA: Diagnosis present

## 2014-05-07 DIAGNOSIS — L89610 Pressure ulcer of right heel, unstageable: Secondary | ICD-10-CM | POA: Diagnosis not present

## 2014-05-07 DIAGNOSIS — J189 Pneumonia, unspecified organism: Secondary | ICD-10-CM | POA: Diagnosis not present

## 2014-05-07 DIAGNOSIS — Z7401 Bed confinement status: Secondary | ICD-10-CM | POA: Diagnosis not present

## 2014-05-07 DIAGNOSIS — R748 Abnormal levels of other serum enzymes: Secondary | ICD-10-CM | POA: Diagnosis not present

## 2014-05-07 DIAGNOSIS — E860 Dehydration: Secondary | ICD-10-CM | POA: Diagnosis not present

## 2014-05-07 DIAGNOSIS — I739 Peripheral vascular disease, unspecified: Secondary | ICD-10-CM | POA: Diagnosis not present

## 2014-05-07 DIAGNOSIS — Z7901 Long term (current) use of anticoagulants: Secondary | ICD-10-CM | POA: Diagnosis not present

## 2014-05-07 DIAGNOSIS — R278 Other lack of coordination: Secondary | ICD-10-CM | POA: Diagnosis not present

## 2014-05-07 DIAGNOSIS — I4891 Unspecified atrial fibrillation: Secondary | ICD-10-CM | POA: Diagnosis not present

## 2014-05-07 DIAGNOSIS — L89620 Pressure ulcer of left heel, unstageable: Secondary | ICD-10-CM | POA: Diagnosis not present

## 2014-05-07 DIAGNOSIS — R030 Elevated blood-pressure reading, without diagnosis of hypertension: Secondary | ICD-10-CM | POA: Diagnosis not present

## 2014-05-07 DIAGNOSIS — J188 Other pneumonia, unspecified organism: Secondary | ICD-10-CM | POA: Diagnosis not present

## 2014-05-07 DIAGNOSIS — R5381 Other malaise: Secondary | ICD-10-CM | POA: Diagnosis not present

## 2014-05-07 DIAGNOSIS — R400 Somnolence: Secondary | ICD-10-CM | POA: Diagnosis not present

## 2014-05-07 DIAGNOSIS — L89153 Pressure ulcer of sacral region, stage 3: Secondary | ICD-10-CM | POA: Diagnosis not present

## 2014-05-07 DIAGNOSIS — N179 Acute kidney failure, unspecified: Secondary | ICD-10-CM | POA: Diagnosis not present

## 2014-05-07 DIAGNOSIS — J9610 Chronic respiratory failure, unspecified whether with hypoxia or hypercapnia: Secondary | ICD-10-CM | POA: Diagnosis not present

## 2014-05-07 DIAGNOSIS — J690 Pneumonitis due to inhalation of food and vomit: Secondary | ICD-10-CM | POA: Diagnosis not present

## 2014-05-07 DIAGNOSIS — E785 Hyperlipidemia, unspecified: Secondary | ICD-10-CM | POA: Diagnosis not present

## 2014-05-07 DIAGNOSIS — Z4789 Encounter for other orthopedic aftercare: Secondary | ICD-10-CM | POA: Diagnosis not present

## 2014-05-07 DIAGNOSIS — R918 Other nonspecific abnormal finding of lung field: Secondary | ICD-10-CM | POA: Diagnosis not present

## 2014-05-07 DIAGNOSIS — M25561 Pain in right knee: Secondary | ICD-10-CM | POA: Diagnosis not present

## 2014-05-07 DIAGNOSIS — I639 Cerebral infarction, unspecified: Secondary | ICD-10-CM | POA: Diagnosis not present

## 2014-05-07 DIAGNOSIS — E876 Hypokalemia: Secondary | ICD-10-CM | POA: Diagnosis present

## 2014-05-07 DIAGNOSIS — I481 Persistent atrial fibrillation: Secondary | ICD-10-CM | POA: Diagnosis not present

## 2014-05-07 DIAGNOSIS — K219 Gastro-esophageal reflux disease without esophagitis: Secondary | ICD-10-CM | POA: Diagnosis not present

## 2014-05-07 DIAGNOSIS — S72461D Displaced supracondylar fracture with intracondylar extension of lower end of right femur, subsequent encounter for closed fracture with routine healing: Secondary | ICD-10-CM | POA: Diagnosis not present

## 2014-05-07 DIAGNOSIS — Z85828 Personal history of other malignant neoplasm of skin: Secondary | ICD-10-CM | POA: Diagnosis not present

## 2014-05-07 DIAGNOSIS — Z66 Do not resuscitate: Secondary | ICD-10-CM | POA: Diagnosis present

## 2014-05-07 DIAGNOSIS — R64 Cachexia: Secondary | ICD-10-CM | POA: Diagnosis not present

## 2014-05-07 DIAGNOSIS — M6281 Muscle weakness (generalized): Secondary | ICD-10-CM | POA: Diagnosis not present

## 2014-05-07 DIAGNOSIS — S7291XS Unspecified fracture of right femur, sequela: Secondary | ICD-10-CM | POA: Diagnosis not present

## 2014-05-07 DIAGNOSIS — R531 Weakness: Secondary | ICD-10-CM | POA: Diagnosis not present

## 2014-05-07 DIAGNOSIS — Z7189 Other specified counseling: Secondary | ICD-10-CM | POA: Diagnosis not present

## 2014-05-07 DIAGNOSIS — K922 Gastrointestinal hemorrhage, unspecified: Secondary | ICD-10-CM | POA: Diagnosis not present

## 2014-05-07 DIAGNOSIS — M797 Fibromyalgia: Secondary | ICD-10-CM | POA: Diagnosis not present

## 2014-05-07 DIAGNOSIS — I69159 Hemiplegia and hemiparesis following nontraumatic intracerebral hemorrhage affecting unspecified side: Secondary | ICD-10-CM | POA: Diagnosis not present

## 2014-05-07 DIAGNOSIS — I69328 Other speech and language deficits following cerebral infarction: Secondary | ICD-10-CM | POA: Diagnosis not present

## 2014-05-07 DIAGNOSIS — R627 Adult failure to thrive: Secondary | ICD-10-CM | POA: Diagnosis not present

## 2014-05-07 DIAGNOSIS — H9190 Unspecified hearing loss, unspecified ear: Secondary | ICD-10-CM | POA: Diagnosis not present

## 2014-05-07 DIAGNOSIS — Z515 Encounter for palliative care: Secondary | ICD-10-CM | POA: Diagnosis not present

## 2014-05-07 DIAGNOSIS — D509 Iron deficiency anemia, unspecified: Secondary | ICD-10-CM | POA: Diagnosis present

## 2014-05-07 DIAGNOSIS — L89899 Pressure ulcer of other site, unspecified stage: Secondary | ICD-10-CM | POA: Diagnosis not present

## 2014-05-07 DIAGNOSIS — K625 Hemorrhage of anus and rectum: Secondary | ICD-10-CM | POA: Diagnosis not present

## 2014-05-07 DIAGNOSIS — E46 Unspecified protein-calorie malnutrition: Secondary | ICD-10-CM | POA: Diagnosis not present

## 2014-05-07 DIAGNOSIS — R1310 Dysphagia, unspecified: Secondary | ICD-10-CM | POA: Diagnosis not present

## 2014-05-07 DIAGNOSIS — Z8673 Personal history of transient ischemic attack (TIA), and cerebral infarction without residual deficits: Secondary | ICD-10-CM | POA: Diagnosis not present

## 2014-05-07 DIAGNOSIS — Z9181 History of falling: Secondary | ICD-10-CM | POA: Diagnosis not present

## 2014-05-07 DIAGNOSIS — I480 Paroxysmal atrial fibrillation: Secondary | ICD-10-CM | POA: Diagnosis not present

## 2014-05-07 DIAGNOSIS — Z79899 Other long term (current) drug therapy: Secondary | ICD-10-CM | POA: Diagnosis not present

## 2014-05-07 DIAGNOSIS — R1312 Dysphagia, oropharyngeal phase: Secondary | ICD-10-CM | POA: Diagnosis not present

## 2014-05-07 DIAGNOSIS — L89302 Pressure ulcer of unspecified buttock, stage 2: Secondary | ICD-10-CM | POA: Diagnosis not present

## 2014-05-07 DIAGNOSIS — R05 Cough: Secondary | ICD-10-CM | POA: Diagnosis not present

## 2014-05-10 ENCOUNTER — Non-Acute Institutional Stay (SKILLED_NURSING_FACILITY): Payer: Medicare Other | Admitting: Internal Medicine

## 2014-05-10 DIAGNOSIS — I509 Heart failure, unspecified: Secondary | ICD-10-CM

## 2014-05-10 DIAGNOSIS — R1310 Dysphagia, unspecified: Secondary | ICD-10-CM | POA: Diagnosis not present

## 2014-05-10 DIAGNOSIS — I739 Peripheral vascular disease, unspecified: Secondary | ICD-10-CM

## 2014-05-10 DIAGNOSIS — I482 Chronic atrial fibrillation, unspecified: Secondary | ICD-10-CM

## 2014-05-10 DIAGNOSIS — J690 Pneumonitis due to inhalation of food and vomit: Secondary | ICD-10-CM | POA: Diagnosis not present

## 2014-05-10 DIAGNOSIS — R627 Adult failure to thrive: Secondary | ICD-10-CM | POA: Diagnosis not present

## 2014-05-10 DIAGNOSIS — I10 Essential (primary) hypertension: Secondary | ICD-10-CM

## 2014-05-10 DIAGNOSIS — D509 Iron deficiency anemia, unspecified: Secondary | ICD-10-CM | POA: Diagnosis not present

## 2014-05-10 DIAGNOSIS — R5381 Other malaise: Secondary | ICD-10-CM | POA: Diagnosis not present

## 2014-05-10 DIAGNOSIS — L98499 Non-pressure chronic ulcer of skin of other sites with unspecified severity: Secondary | ICD-10-CM | POA: Diagnosis not present

## 2014-05-10 DIAGNOSIS — I70209 Unspecified atherosclerosis of native arteries of extremities, unspecified extremity: Secondary | ICD-10-CM

## 2014-05-10 DIAGNOSIS — S7291XS Unspecified fracture of right femur, sequela: Secondary | ICD-10-CM

## 2014-05-10 DIAGNOSIS — I639 Cerebral infarction, unspecified: Secondary | ICD-10-CM | POA: Diagnosis not present

## 2014-05-10 NOTE — Progress Notes (Signed)
Patient ID: Pamela [name], female   DOB: 06-Mar-1917, 81 y.o.   MRN: [id number]     Facility: [facility]    PCP: Arnette Norris, MD  Code Status: full code  Allergies  Allergen Reactions  . Statins Other (See Comments)    unknown    Chief Complaint  Patient presents with  . New Admit To SNF     HPI:  81 year old patient is here for short term rehabilitation post hospital admission from 04/28/14-05/07/14 after a fall with right distal femoral diaphysis fracture. She underwent ORIF on 04/30/14. Post op she had acute confusion and work up with CT head revealed subacute to acute cortical infarct in right posterior parietal area. Neurology was consulted and suggested resuming eliquis when cleared by orthopedics. This was resumed. Pt then developed hypoxia and was noted to have pneumonia and pulmonary edema. She was started on zosyn and lasix and had dysphagia for which she was seen by SLP team. She was switched to po augmentin prior to discharge to SNF. Her prognosis was felt to be poor at time of discharge and palliative care was recommended. She has PMH of CVA with right hemiparesis, HLD, afib, PVD She is seen in her room today. She is pleasantly confused, unable to participate in HPI and ROS Has chronic foley catheter   Review of Systems:  Unable to obtain   Past Medical History  Diagnosis Date  . Hyperlipidemia   . Hypertension   . Fibromyalgia   . Thyroid disease     hyperparathyroid  . PAD (peripheral artery disease)   . TIA (transient ischemic attack) ~ 2009    "only 1 that I know of" (12/13/2011)  . Atrial fibrillation     Not on Coumadin. Noted during 02/2009 admission.   . Skin cancer     Unknown types  . Schatzki's ring     Non obstructing ring on EGD 02/2009   . Pancreatic mass 02/2009    Abnormal CT abdomen, with cystic area in head and circumferential thickening of midbody of stomach  . Fibula fracture 12/11/2011    left  . Stroke 2006   "drags right leg very slightly" (12/13/2011)  . GERD (gastroesophageal reflux disease)   . H/O hiatal hernia   . Hard of hearing     "totally deaf in the right; 50% is gone in the left" (12/13/2011)   Past Surgical History  Procedure Laterality Date  . Appendectomy  1940  . Parathyroidectomy  1980  . Vaginal hysterectomy  1970   Social History:   reports that she has never smoked. She has never used smokeless tobacco. She reports that she does not drink alcohol or use illicit drugs.  Family History  Problem Relation Age of Onset  . Cancer Other     breast ca  . Cancer      mouth ca     Medications: Patient's Medications  New Prescriptions   No medications on file  Previous Medications   ACETAMINOPHEN (TYLENOL) 500 MG TABLET    Take 650 mg by mouth every 4 (four) hours as needed.    AMOXICILLIN-CLAVULANATE (AUGMENTIN) 400-57 MG PER CHEWABLE TABLET    Chew 1 tablet by mouth 3 (three) times daily. For total 10 days   APIXABAN (ELIQUIS) 2.5 MG TABS TABLET    Take 1 tablet (2.5 mg total) by mouth every 12 (twelve) hours.   ASCORBIC ACID (VITAMIN C) 500 MG TABLET    Take 500  mg by mouth daily.     CALCIUM CARBONATE (OS-CAL) 600 MG TABS    Take 600 mg by mouth daily.     CALCIUM CARBONATE-VITAMIN D (CALCIUM-VITAMIN D) 500-200 MG-UNIT PER TABLET    Take 1 tablet by mouth 2 (two) times daily.   DOCUSATE SODIUM (COLACE) 100 MG CAPSULE    Take 100 mg by mouth 2 (two) times daily.   EZETIMIBE (ZETIA) 10 MG TABLET    Take 1 tablet (10 mg total) by mouth daily.   FERROUS SULFATE 325 (65 FE) MG TABLET    Take 325 mg by mouth 2 (two) times daily with a meal.   FUROSEMIDE (LASIX) 40 MG TABLET    Take 40 mg by mouth daily.   LOSARTAN (COZAAR) 100 MG TABLET    take 1 tablet by mouth daily   MAGNESIUM OXIDE (MAG-OX) 400 (241.3 MG) MG TABLET    take 1 tablet by mouth once daily   METOPROLOL TARTRATE (LOPRESSOR) 25 MG TABLET    take 1 tablet by mouth at bedtime   MULTIPLE VITAMINS-MINERALS (CENTRUM  SILVER PO)    Take 1 tablet by mouth daily.     RANITIDINE (ZANTAC) 150 MG TABLET    Take 150 mg by mouth 2 (two) times daily.   RESTASIS 0.05 % OPHTHALMIC EMULSION    Place 1 drop into both eyes 2 (two) times daily.   SENNA (SENOKOT) 8.6 MG TABLET    Take 1 tablet by mouth daily. hold for loose bowels   TRAMADOL (ULTRAM) 50 MG TABLET    Take 25 mg by mouth every 6 (six) hours as needed.  Modified Medications   No medications on file  Discontinued Medications   AMLODIPINE (NORVASC) 10 MG TABLET    Take 5 mg by mouth daily.   UREA (CARMOL) 40 % CREA    Apply bid to heel, disp 30 grams     Physical Exam: Filed Vitals:   05/10/14 1420  BP: 132/60  Pulse: 75  Temp: 98.9 F (37.2 C)  Resp: 18  SpO2: 95%    General- elderly female, frail and thin built, in no acute distress Head- normocephalic, atraumatic Throat- moist mucus membrane Neck- no cervical lymphadenopathy Cardiovascular- normal s1,s2, no murmurs, trace right leg edema Respiratory- bilateral poor air entry, rhonchi present, no wheeze, no crackles, no use of accessory muscles, on o2 by nasal canula Abdomen- bowel sounds present, soft, non tender Musculoskeletal- Multiple unstageable ulcers on right foot and SDTI to left foot, poor distal pulses. Has honeycomb dressing on right leg, no signs of infection. Brace in place Neurological- alert but not oriented, has dysphagia and possible aphasia Skin- warm and dry Psychiatry- unable to assess   Labs reviewed: Basic Metabolic Panel:  Recent Labs  03/05/14 1905 03/06/14 1300 03/08/14 0825  NA 141 139 144  K 4.2 4.5 4.1  CL 101 99 106  CO2 27 28 27   GLUCOSE 101* 93 129*  BUN 29* 21 11  CREATININE 0.79 0.72 0.65  CALCIUM 10.0 9.6 9.2   Liver Function Tests:  Recent Labs  01/08/14 1033 03/05/14 1905 03/06/14 1300  AST 22 29 32  ALT 16 18 19   ALKPHOS 62 79 80  BILITOT 0.6 0.4 0.5  PROT 7.4 7.1 6.9  ALBUMIN 3.8 4.1 3.9   No results for input(s): LIPASE,  AMYLASE in the last 8760 hours. No results for input(s): AMMONIA in the last 8760 hours. CBC:  Recent Labs  01/08/14 1033 03/05/14 1905 03/06/14 1300  03/08/14 0825  WBC 5.9 6.1 5.8 5.4  NEUTROABS 3.7  --  4.4  --   HGB 14.2 13.1 13.6 13.6  HCT 43.9 39.8 41.9 43.2  MCV 93.2 93.9 96.5 94.1  PLT 205.0 206 188 202    Assessment/Plan  Physical deconditioning Will have her work with physical therapy and occupational therapy team to help with gait training and muscle strengthening exercises.fall precautions. Skin care. Encourage to be out of bed. Given her co-morbidities has poor prognosis for recovery. If no improvement made with therapy, to consider palliative/ hospice services  FTT With multiple ulcers, bed bound, confusion, recent fracture and CVA, decline is anticipated. Feeding herself, monitor for assistance with feeding, encourage po intake, to be followed by dietary. Check prealbumin level  Right femoral fracture S/p ORIF. To work with PT, OT. Continue tylenol and tramadol prn for pain and eliquis for dvt prophylaxis, has f/u with orthopedics  Dysphagia SLP, aspiration precautions, pureed feed with nectar thick liquid  Pneumonia Oxygen by nasal canula @ 3 litres, to complete her course of augmentin  Acute cva Continue eliquis with metoprolol 25 mg daily and losratan 100 mg daily. Continue zetia. Monitor bp  CHF Stable, continue b blocker and ARB with lasix 40 mg daily, monitor weight  afib Rate controlled, continue metoprolol 25 mg daily and eliquis for anticoagulation  PVD Continue wound care, to be followed by wound care team and dietary team, continue vitamin c supplement  Iron def anemia continue iron supplement, monitor h&h  Goals of care: short term rehabilitation but possible long term care vs palliative/ hospice   Labs/tests ordered: cbc with diff, cmp, prealbumin  Family/ staff Communication: reviewed care plan with patient and nursing  supervisor    Blanchie Serve, MD  [facility] [phone] (Monday-Friday 8 am - 5 pm) [phone] (afterhours)

## 2014-05-13 ENCOUNTER — Non-Acute Institutional Stay (SKILLED_NURSING_FACILITY): Payer: Medicare Other | Admitting: Internal Medicine

## 2014-05-13 DIAGNOSIS — J690 Pneumonitis due to inhalation of food and vomit: Secondary | ICD-10-CM

## 2014-05-13 DIAGNOSIS — R627 Adult failure to thrive: Secondary | ICD-10-CM

## 2014-05-13 DIAGNOSIS — E870 Hyperosmolality and hypernatremia: Secondary | ICD-10-CM

## 2014-05-13 DIAGNOSIS — R748 Abnormal levels of other serum enzymes: Secondary | ICD-10-CM

## 2014-05-17 ENCOUNTER — Non-Acute Institutional Stay (SKILLED_NURSING_FACILITY): Payer: Medicare Other | Admitting: Internal Medicine

## 2014-05-17 DIAGNOSIS — J690 Pneumonitis due to inhalation of food and vomit: Secondary | ICD-10-CM

## 2014-05-17 DIAGNOSIS — E46 Unspecified protein-calorie malnutrition: Secondary | ICD-10-CM | POA: Diagnosis not present

## 2014-05-17 DIAGNOSIS — D72829 Elevated white blood cell count, unspecified: Secondary | ICD-10-CM

## 2014-05-17 DIAGNOSIS — R5381 Other malaise: Secondary | ICD-10-CM

## 2014-05-17 DIAGNOSIS — R748 Abnormal levels of other serum enzymes: Secondary | ICD-10-CM

## 2014-05-17 DIAGNOSIS — E870 Hyperosmolality and hypernatremia: Secondary | ICD-10-CM

## 2014-05-17 NOTE — Progress Notes (Signed)
Patient ID: Pamela [name], female   DOB: [birth year], 81 y.o.   MRN: [id number]    Facility: [facility]   Chief Complaint  Patient presents with  . Acute Visit    hypernatremia, deranged lft   HPI 81 year old patient is seen with acute concerns. She has not been participating in therapy and has abnormal lab values. She was noted to have high sodium level and started on d5 half NS yesterday. She completed it this am and had recheck of her labs. This shows deranged sodium level and liver enzymes. Patient is very lethargic and not participating in her hpi and ros. As per staff she has poor po intake.  She is here for rehabilitation post hospital admission with right distal femoral diaphysis fracture s/p ORIF and acute CVA.  She is currently on augmentin for her pneumonia  Review of Systems  Unable to obtain, as per staff no fever or chills, no agitation, no falls, sleeping mostly, poor po intake, minimal participation with therapy team  Past Medical History  Diagnosis Date  . Hyperlipidemia   . Hypertension   . Fibromyalgia   . Thyroid disease     hyperparathyroid  . PAD (peripheral artery disease)   . TIA (transient ischemic attack) ~ 2009    "only 1 that I know of" (12/13/2011)  . Atrial fibrillation     Not on Coumadin. Noted during 02/2009 admission.   . Skin cancer     Unknown types  . Schatzki's ring     Non obstructing ring on EGD 02/2009   . Pancreatic mass 02/2009    Abnormal CT abdomen, with cystic area in head and circumferential thickening of midbody of stomach  . Fibula fracture 12/11/2011    left  . Stroke 2006    "drags right leg very slightly" (12/13/2011)  . GERD (gastroesophageal reflux disease)   . H/O hiatal hernia   . Hard of hearing     "totally deaf in the right; 50% is gone in the left" (12/13/2011)   Medication reviewed. See [facility]  Physical exam BP 134/60 mmHg  Pulse 94  Temp(Src) 98 F (36.7 C)  Resp 20  SpO2  91%  General- elderly female, frail and thin built, lethargic Head- normocephalic, atraumatic Throat- dry mucus membrane Neck- no cervical lymphadenopathy Cardiovascular- normal s1,s2, no murmurs, trace leg edema Respiratory- bilateral poor air entry, rhonchi present, no wheezing, no crackles, no use of accessory muscles, on o2 by nasal canula Abdomen- bowel sounds present, soft, non tender Musculoskeletal- generalized weakness Neurological- difficult to assess with her lethargy  labs 05/11/14 wbc 8.4, hb 9.6, hct 30.2, na 153, k 4, bun 41, cr 0.67, t.bil 0.8, alp 151, ast 75, alt 112, alb 2.9, ca 8.1, prealbumin 11.6 05/12/14 na 154, k 4.1, bun 42, cr 0.73, ca 8.6, co2 29 05/13/14 na 154, k 4.2, cl 115, bun 44, cr 0.88, ca 8.3   Assessment/plan  Hypernatremia New, from hypovolemia, has dry mucus membrane and very poor po intake. Will have her on iv fluid d5w 100 cc/hr for 1 litre followed by recheck of her sodium level. Will need pt to be more alert to tolerate po feed. Monitor for seizures  Abnormal liver enzymes Her lft is deranged, new from before, is on zetia and augmentin both of which can contribute to this. stop them for now. With her needing rx for pneumonia, start avelox 400 mg daily for a week. Recheck lft in am along with  cbc with diff. If no improvement, consider abdominal ultrasound  FTT Has low prealbumin, poor po intake, pressure ulcers, new fracture all of which can precipitate her decline. Will try to correct her hypernatremia and see if she makes clinical improvement. If not, will need palliative care.   Pneumonia Start avelox for a week, strict aspiration precautions and dysphagia diet. Monitor clinically, f/u with cbc with diff

## 2014-05-17 NOTE — Progress Notes (Signed)
Patient ID: Pamela [name], female   DOB: 1916-05-02, 81 y.o.   MRN: [id number]    Facility: [facility]   Chief Complaint  Patient presents with  . Acute Visit   Allergies  Allergen Reactions  . Statins Other (See Comments)    unknown   HPI 81 year old patient is here for short term rehabilitation post hospital admission with right distal femoral diaphysis fracture s/p ORIF on 04/30/14 and subacute to acute cortical infarct in right posterior parietal area.  She is seen in her room today. She is alert today compared to last visit. She is seen with therapy team in the room. As per staff she had very minimal participation with therapy last week. Er lab work revelaed hypernatremia which was likely from poor po intake. She was started on iv d5w following which repeat bmp showed sodium level of 151 from 155. Her LFTs were also deranged on review following which her antibiotic was switched and her lipid medications held. Today her lft is improved. She ate 25% of her breakfast today with assistance. She has occassional cough with meals.  Review of Systems  Constitutional: positive for easy fatigue. Negative for fever, chills and diaphoresis.  HENT: Positive for hearing loss, has hearing aid. Negative for congestion and sore throat.   Eyes: Negative for blurred vision, double vision and discharge.  Respiratory: Negative for shortness of breath and wheezing. On o2 by nasal canula   Cardiovascular: Negative for chest pain, palpitations.  Gastrointestinal: Negative for heartburn, nausea, vomiting, abdominal pain. ate 25% of her breakfast this am which is an improvement compared to other days. Needs assistance with feeding Genitourinary: has indwelling foley catheter Musculoskeletal: Negative for falls in facility. Positive for pain in left leg Skin: Negative for itching and rash.  Neurological: Negative for dizziness and headaches.   Past Medical History  Diagnosis Date    . Hyperlipidemia   . Hypertension   . Fibromyalgia   . Thyroid disease     hyperparathyroid  . PAD (peripheral artery disease)   . TIA (transient ischemic attack) ~ 2009    "only 1 that I know of" (12/13/2011)  . Atrial fibrillation     Not on Coumadin. Noted during 02/2009 admission.   . Skin cancer     Unknown types  . Schatzki's ring     Non obstructing ring on EGD 02/2009   . Pancreatic mass 02/2009    Abnormal CT abdomen, with cystic area in head and circumferential thickening of midbody of stomach  . Fibula fracture 12/11/2011    left  . Stroke 2006    "drags right leg very slightly" (12/13/2011)  . GERD (gastroesophageal reflux disease)   . H/O hiatal hernia   . Hard of hearing     "totally deaf in the right; 50% is gone in the left" (12/13/2011)   Medication reviewed. See [facility]  Physical exam BP 140/65 mmHg  Pulse 74  Temp(Src) 96.9 F (36.1 C)  Resp 18  SpO2 92%  General- elderly female, frail and thin built, in no acute distress Head- normocephalic, atraumatic Throat- dry mucus membrane Neck- no cervical lymphadenopathy Cardiovascular- normal s1,s2, no murmurs, trace leg edema Respiratory- bilateral poor air entry, no wheeze or rhonchi, no crackles, no use of accessory muscles, on o2 by nasal canula Abdomen- bowel sounds present, soft, non tender Musculoskeletal- Multiple unstageable ulcers and SDTI on right foot and SDTI to left foot, poor distal pulses. Has honeycomb dressing on right  leg, no signs of infection. Brace in place, generalized weakness Neurological- alert and oriented today Skin- warm and dry Psychiatry- mood pleasant today  Labs  CBC Latest Ref Rng 03/08/2014 03/06/2014 03/05/2014  WBC 4.0 - 10.5 K/uL 5.4 5.8 6.1  Hemoglobin 12.0 - 15.0 g/dL 13.6 13.6 13.1  Hematocrit 36.0 - 46.0 % 43.2 41.9 39.8  Platelets 150 - 400 K/uL 202 188 206    CMP Latest Ref Rng 03/08/2014 03/06/2014 03/05/2014  Glucose 70 - 99 mg/dL 129(H) 93 101(H)  BUN 6 -  23 mg/dL 11 21 29(H)  Creatinine 0.50 - 1.10 mg/dL 0.65 0.72 0.79  Sodium 137 - 147 mEq/L 144 139 141  Potassium 3.7 - 5.3 mEq/L 4.1 4.5 4.2  Chloride 96 - 112 mEq/L 106 99 101  CO2 19 - 32 mEq/L 27 28 27   Calcium 8.4 - 10.5 mg/dL 9.2 9.6 10.0  Total Protein 6.0 - 8.3 g/dL - 6.9 7.1  Total Bilirubin 0.3 - 1.2 mg/dL - 0.5 0.4  Alkaline Phos 39 - 117 U/L - 80 79  AST 0 - 37 U/L - 32 29  ALT 0 - 35 U/L - 19 18    05/11/14 wbc 8.4, hb 9.6, hct 30.2, na 153, k 4, bun 41, cr 0.67, t.bil 0.8, alp 151, ast 75, alt 112, alb 2.9, ca 8.1, prealbumin 11.6 05/12/14 na 154, k 4.1, bun 42, cr 0.73, ca 8.6, co2 29 05/13/14 na 154, k 4.2, cl 115, bun 44, cr 0.88, ca 8.3 05/14/14 wbc 11.3, hb 10, hct 32, plt 466, granulocyte 73 05/16/14 na 151, k 4, cl 113, bun 37, cr 0.83, t.bil 0.6, alp 120, ast 27, alt 47, alb 2.7, ca 8.1  Assessment/plan  Physical deconditioning Has low prealbumin, new stroke, new fracture s/p surgery, high aspiration risk, recent infection and hypernatremia all of which could be contributing to her deconditioning along with her age. She appears more alert and communicative to me today than a week back. Will have her work with PT and OT as tolerated for now and see her improvement over next few days. If she fails to mae progress over the next 3-4 days, will need to get palliative care consult  PCM Poor po intake, have encouraged patient to take her food. Will get dietary consult for addition of supplements. Will need assistance with feeding and aspiration precautions. Will need oral hygiene. Add decubvite once a day.  Hypernatremia Improved from before 154-->151 with some clinical improvement as pt is more alert today. Encourage po intake. Will provide her with d5w @ 75 cc/hr for 1 litre and recheck her bmp.  Liver enzyme abnormality Improving, her zetia and augmentin were likely contributing to this. After stopping these, it is trending down. Monitor clinically for  now.  Pneumonia Continue and complete course of avelox, pt doing better clinically. Strict aspiration precautions and dysphagia diet  Leukocytosis Mild leukocytosis likley from her pneumonia and dehydration. Clinically better than last week. Monitor clinically for now, complete antibiotic course and recheck cbc with diff  cmp and cbc with diff in 05/19/14

## 2014-05-20 DIAGNOSIS — M25561 Pain in right knee: Secondary | ICD-10-CM | POA: Diagnosis not present

## 2014-05-24 ENCOUNTER — Ambulatory Visit: Payer: Medicare Other | Admitting: Neurology

## 2014-05-25 ENCOUNTER — Encounter: Payer: Self-pay | Admitting: Neurology

## 2014-05-25 ENCOUNTER — Ambulatory Visit
Admit: 2014-05-25 | Disposition: A | Discharge status: Home / Self Care | Payer: Self-pay | Attending: Internal Medicine | Admitting: Internal Medicine

## 2014-05-28 ENCOUNTER — Non-Acute Institutional Stay (SKILLED_NURSING_FACILITY): Payer: Medicare Other | Admitting: Registered Nurse

## 2014-05-28 DIAGNOSIS — Z7189 Other specified counseling: Secondary | ICD-10-CM

## 2014-05-29 ENCOUNTER — Encounter: Payer: Self-pay | Admitting: Registered Nurse

## 2014-05-29 NOTE — Progress Notes (Signed)
Patient ID: TRICA USERY, female   DOB: 07-15-1916, 81 y.o.   MRN: [id number]   Place of Service: [facility]  Allergies  Allergen Reactions  . Statins Other (See Comments)    unknown    Code Status: Full Code  Goals of Care: Longevity/STR  Chief Complaint  Patient presents with  . Acute Visit    goal of care    HPI 81 y.o. female with PMH of HTN, HLD, fibromyalgia, paroxysmal afib, PAD, CVA,  right distal femoral diaphysis fracture s/p ORIF on 04/30/14 is being seen for an acute visit to discuss with family about goal of care. Per therapy team, patient is not making much progress with PT or OT. Unable to participate in ADLs due to severe fear of falling. She requires total assistance with ADLs. Poor prognosis secondary to  advanced age, frailty, and complex medical issues. Palliative care was recommended during recent hospitalization. Seen in room today, unable to participate in HPI or ROS  Review of Systems Unable to obtain due   Past Medical History  Diagnosis Date  . Hyperlipidemia   . Hypertension   . Fibromyalgia   . Thyroid disease     hyperparathyroid  . PAD (peripheral artery disease)   . TIA (transient ischemic attack) ~ 2009    "only 1 that I know of" (12/13/2011)  . Atrial fibrillation     Not on Coumadin. Noted during 02/2009 admission.   . Skin cancer     Unknown types  . Schatzki's ring     Non obstructing ring on EGD 02/2009   . Pancreatic mass 02/2009    Abnormal CT abdomen, with cystic area in head and circumferential thickening of midbody of stomach  . Fibula fracture 12/11/2011    left  . Stroke 2006    "drags right leg very slightly" (12/13/2011)  . GERD (gastroesophageal reflux disease)   . H/O hiatal hernia   . Hard of hearing     "totally deaf in the right; 50% is gone in the left" (12/13/2011)    Past Surgical History  Procedure Laterality Date  . Appendectomy  1940  . Parathyroidectomy  1980  . Vaginal hysterectomy  1970     History  Substance Use Topics  . Smoking status: Never Smoker   . Smokeless tobacco: Never Used  . Alcohol Use: No    Family History  Problem Relation Age of Onset  . Cancer Other     breast ca  . Cancer      mouth ca       Medication List       This list is accurate as of: 05/28/14 11:59 PM.  Always use your most recent med list.               acetaminophen 500 MG tablet  Commonly known as:  TYLENOL  Take 650 mg by mouth every 4 (four) hours as needed.     amoxicillin-clavulanate 400-57 MG per chewable tablet  Commonly known as:  AUGMENTIN  Chew 1 tablet by mouth 3 (three) times daily. For total 10 days     apixaban 2.5 MG Tabs tablet  Commonly known as:  ELIQUIS  Take 1 tablet (2.5 mg total) by mouth every 12 (twelve) hours.     calcium carbonate 600 MG Tabs tablet  Commonly known as:  OS-CAL  Take 600 mg by mouth daily.     calcium-vitamin D 500-200 MG-UNIT per tablet  Take 1 tablet by  mouth 2 (two) times daily.     CENTRUM SILVER PO  Take 1 tablet by mouth daily.     docusate sodium 100 MG capsule  Commonly known as:  COLACE  Take 100 mg by mouth 2 (two) times daily.     ezetimibe 10 MG tablet  Commonly known as:  ZETIA  Take 1 tablet (10 mg total) by mouth daily.     ferrous sulfate 325 (65 FE) MG tablet  Take 325 mg by mouth 2 (two) times daily with a meal.     furosemide 40 MG tablet  Commonly known as:  LASIX  Take 40 mg by mouth daily.     losartan 100 MG tablet  Commonly known as:  COZAAR  take 1 tablet by mouth daily     magnesium oxide 400 (241.3 MG) MG tablet  Commonly known as:  MAG-OX  take 1 tablet by mouth once daily     metoprolol tartrate 25 MG tablet  Commonly known as:  LOPRESSOR  take 1 tablet by mouth at bedtime     ranitidine 150 MG tablet  Commonly known as:  ZANTAC  Take 150 mg by mouth 2 (two) times daily.     RESTASIS 0.05 % ophthalmic emulsion  Generic drug:  cycloSPORINE  Place 1 drop into both eyes 2  (two) times daily.     senna 8.6 MG tablet  Commonly known as:  SENOKOT  Take 1 tablet by mouth daily. hold for loose bowels     traMADol 50 MG tablet  Commonly known as:  ULTRAM  Take 25 mg by mouth every 6 (six) hours as needed.     vitamin C 500 MG tablet  Commonly known as:  ASCORBIC ACID  Take 500 mg by mouth daily.        Physical Exam  BP 128/62 mmHg  Pulse 66  Temp(Src) 97.4 F (36.3 C)  Resp 19  Ht 5\' 4"  (1.626 m)  Wt 116 lb 6.4 oz (52.799 kg)  BMI 19.97 kg/m2  Constitutional: frail elderly female in no acute distress.  HEENT: Normocephalic and atraumatic. PERRL. EOM intact. No scleral icterus. Left ear hearing aid in place. Has hearing impairment.   Neck: No JVD or carotid bruits. Cardiac: Normal S1, S2. RRR without appreciable murmurs, rubs, or gallops. Distal pulses intact. Trace dependent edema.  Lungs: No respiratory distress. Breath sounds clear bilaterally without rales, rhonchi, or wheezes. Abdomen: Audible bowel sounds in all quadrants. Soft, nontender, nondistended. Musculoskeletal: able to move BUE. RLE immobilizer in place.  Skin: Warm and dry. Multiple ulcers noted on BLE and sacrum Psychiatric: flat affect.   Assessment & Plan 1. Goals of care, counseling/discussion Notified niece, Marcie Bal about patient's poor progress in therapy and poor participation with ADLs. We discussed about her overall prognosis. Niece stated that she would like for the patient to be comfortable, but has not change her code status because she feels like staff would stop responding to her needs. I explained to her that having clear goals of care will only help [place] take better care of the patient. Palliative consult initiated with niece's approval. Continue to monitor patient status.    Family/Staff Communication Plan of care discussed with niece, Marcie Bal and Engineer, civil (consulting).  Niece and nursing staff verbalized understanding and agree with plan of care. No additional questions or  concerns reported.    Arthur Holms, MSN, AGNP-C [facility] [address] [place], [place] [address] [phone] [8am-5pm] After hours: [phone]

## 2014-06-01 DIAGNOSIS — R627 Adult failure to thrive: Secondary | ICD-10-CM | POA: Diagnosis not present

## 2014-06-02 DIAGNOSIS — R627 Adult failure to thrive: Secondary | ICD-10-CM | POA: Diagnosis not present

## 2014-06-08 ENCOUNTER — Inpatient Hospital Stay (HOSPITAL_COMMUNITY)
Admission: EM | Admit: 2014-06-08 | Discharge: 2014-06-10 | DRG: 377 | Disposition: A | Discharge status: Hospice | Payer: Medicare Other | Attending: Family Medicine | Admitting: Family Medicine

## 2014-06-08 ENCOUNTER — Emergency Department (HOSPITAL_COMMUNITY): Payer: Medicare Other

## 2014-06-08 ENCOUNTER — Encounter (HOSPITAL_COMMUNITY): Payer: Self-pay | Admitting: Emergency Medicine

## 2014-06-08 DIAGNOSIS — Z8673 Personal history of transient ischemic attack (TIA), and cerebral infarction without residual deficits: Secondary | ICD-10-CM | POA: Diagnosis not present

## 2014-06-08 DIAGNOSIS — Z7401 Bed confinement status: Secondary | ICD-10-CM

## 2014-06-08 DIAGNOSIS — I639 Cerebral infarction, unspecified: Secondary | ICD-10-CM | POA: Diagnosis not present

## 2014-06-08 DIAGNOSIS — E213 Hyperparathyroidism, unspecified: Secondary | ICD-10-CM | POA: Diagnosis not present

## 2014-06-08 DIAGNOSIS — E860 Dehydration: Secondary | ICD-10-CM

## 2014-06-08 DIAGNOSIS — K625 Hemorrhage of anus and rectum: Secondary | ICD-10-CM | POA: Diagnosis not present

## 2014-06-08 DIAGNOSIS — N179 Acute kidney failure, unspecified: Secondary | ICD-10-CM | POA: Diagnosis not present

## 2014-06-08 DIAGNOSIS — E870 Hyperosmolality and hypernatremia: Secondary | ICD-10-CM

## 2014-06-08 DIAGNOSIS — Z79899 Other long term (current) drug therapy: Secondary | ICD-10-CM | POA: Diagnosis not present

## 2014-06-08 DIAGNOSIS — Z515 Encounter for palliative care: Secondary | ICD-10-CM | POA: Diagnosis not present

## 2014-06-08 DIAGNOSIS — R059 Cough, unspecified: Secondary | ICD-10-CM

## 2014-06-08 DIAGNOSIS — L89153 Pressure ulcer of sacral region, stage 3: Secondary | ICD-10-CM | POA: Diagnosis present

## 2014-06-08 DIAGNOSIS — Y95 Nosocomial condition: Secondary | ICD-10-CM | POA: Diagnosis present

## 2014-06-08 DIAGNOSIS — E785 Hyperlipidemia, unspecified: Secondary | ICD-10-CM | POA: Diagnosis present

## 2014-06-08 DIAGNOSIS — J690 Pneumonitis due to inhalation of food and vomit: Secondary | ICD-10-CM | POA: Diagnosis not present

## 2014-06-08 DIAGNOSIS — E876 Hypokalemia: Secondary | ICD-10-CM | POA: Diagnosis present

## 2014-06-08 DIAGNOSIS — L89620 Pressure ulcer of left heel, unstageable: Secondary | ICD-10-CM | POA: Diagnosis present

## 2014-06-08 DIAGNOSIS — L89302 Pressure ulcer of unspecified buttock, stage 2: Secondary | ICD-10-CM | POA: Diagnosis not present

## 2014-06-08 DIAGNOSIS — I69159 Hemiplegia and hemiparesis following nontraumatic intracerebral hemorrhage affecting unspecified side: Secondary | ICD-10-CM | POA: Diagnosis not present

## 2014-06-08 DIAGNOSIS — H9190 Unspecified hearing loss, unspecified ear: Secondary | ICD-10-CM | POA: Diagnosis present

## 2014-06-08 DIAGNOSIS — D509 Iron deficiency anemia, unspecified: Secondary | ICD-10-CM | POA: Diagnosis present

## 2014-06-08 DIAGNOSIS — Z7901 Long term (current) use of anticoagulants: Secondary | ICD-10-CM | POA: Diagnosis not present

## 2014-06-08 DIAGNOSIS — R627 Adult failure to thrive: Secondary | ICD-10-CM | POA: Diagnosis present

## 2014-06-08 DIAGNOSIS — I481 Persistent atrial fibrillation: Secondary | ICD-10-CM | POA: Diagnosis not present

## 2014-06-08 DIAGNOSIS — Z85828 Personal history of other malignant neoplasm of skin: Secondary | ICD-10-CM | POA: Diagnosis not present

## 2014-06-08 DIAGNOSIS — Z66 Do not resuscitate: Secondary | ICD-10-CM | POA: Diagnosis present

## 2014-06-08 DIAGNOSIS — R531 Weakness: Secondary | ICD-10-CM

## 2014-06-08 DIAGNOSIS — K922 Gastrointestinal hemorrhage, unspecified: Secondary | ICD-10-CM | POA: Diagnosis not present

## 2014-06-08 DIAGNOSIS — R64 Cachexia: Secondary | ICD-10-CM | POA: Diagnosis present

## 2014-06-08 DIAGNOSIS — I10 Essential (primary) hypertension: Secondary | ICD-10-CM | POA: Diagnosis present

## 2014-06-08 DIAGNOSIS — R1312 Dysphagia, oropharyngeal phase: Secondary | ICD-10-CM | POA: Diagnosis not present

## 2014-06-08 DIAGNOSIS — R05 Cough: Secondary | ICD-10-CM

## 2014-06-08 DIAGNOSIS — I480 Paroxysmal atrial fibrillation: Secondary | ICD-10-CM | POA: Diagnosis present

## 2014-06-08 DIAGNOSIS — R030 Elevated blood-pressure reading, without diagnosis of hypertension: Secondary | ICD-10-CM | POA: Diagnosis not present

## 2014-06-08 DIAGNOSIS — R1310 Dysphagia, unspecified: Secondary | ICD-10-CM | POA: Diagnosis not present

## 2014-06-08 DIAGNOSIS — I6789 Other cerebrovascular disease: Secondary | ICD-10-CM | POA: Diagnosis not present

## 2014-06-08 DIAGNOSIS — M797 Fibromyalgia: Secondary | ICD-10-CM | POA: Diagnosis not present

## 2014-06-08 DIAGNOSIS — I4891 Unspecified atrial fibrillation: Secondary | ICD-10-CM | POA: Diagnosis present

## 2014-06-08 DIAGNOSIS — K219 Gastro-esophageal reflux disease without esophagitis: Secondary | ICD-10-CM | POA: Diagnosis present

## 2014-06-08 DIAGNOSIS — J9610 Chronic respiratory failure, unspecified whether with hypoxia or hypercapnia: Secondary | ICD-10-CM | POA: Diagnosis present

## 2014-06-08 DIAGNOSIS — J189 Pneumonia, unspecified organism: Secondary | ICD-10-CM | POA: Diagnosis not present

## 2014-06-08 DIAGNOSIS — L89610 Pressure ulcer of right heel, unstageable: Secondary | ICD-10-CM | POA: Diagnosis not present

## 2014-06-08 DIAGNOSIS — L89899 Pressure ulcer of other site, unspecified stage: Secondary | ICD-10-CM | POA: Diagnosis not present

## 2014-06-08 DIAGNOSIS — I739 Peripheral vascular disease, unspecified: Secondary | ICD-10-CM | POA: Diagnosis present

## 2014-06-08 LAB — COMPREHENSIVE METABOLIC PANEL
ALK PHOS: 123 U/L — AB (ref 39–117)
ALT: 97 U/L — ABNORMAL HIGH (ref 0–35)
AST: 49 U/L — AB (ref 0–37)
Albumin: 3.1 g/dL — ABNORMAL LOW (ref 3.5–5.2)
Anion gap: 9 (ref 5–15)
BILIRUBIN TOTAL: 0.5 mg/dL (ref 0.3–1.2)
BUN: 41 mg/dL — ABNORMAL HIGH (ref 6–23)
CHLORIDE: 109 mmol/L (ref 96–112)
CO2: 36 mmol/L — ABNORMAL HIGH (ref 19–32)
CREATININE: 0.72 mg/dL (ref 0.50–1.10)
Calcium: 9 mg/dL (ref 8.4–10.5)
GFR calc Af Amer: 82 mL/min — ABNORMAL LOW (ref >90)
GFR calc non Af Amer: 71 mL/min — ABNORMAL LOW (ref >90)
Glucose, Bld: 132 mg/dL — ABNORMAL HIGH (ref 70–99)
POTASSIUM: 3.4 mmol/L — AB (ref 3.5–5.1)
Sodium: 154 mmol/L — ABNORMAL HIGH (ref 135–145)
Total Protein: 6.5 g/dL (ref 6.0–8.3)

## 2014-06-08 LAB — CBC
HEMATOCRIT: 42.7 % (ref 36.0–46.0)
HEMATOCRIT: 37.4 % (ref 36.0–46.0)
HEMOGLOBIN: 10.9 g/dL — AB (ref 12.0–15.0)
Hemoglobin: 12.6 g/dL (ref 12.0–15.0)
MCH: 28.3 pg (ref 26.0–34.0)
MCH: 27.7 pg (ref 26.0–34.0)
MCHC: 29.5 g/dL — AB (ref 30.0–36.0)
MCHC: 29.1 g/dL — AB (ref 30.0–36.0)
MCV: 96 fL (ref 78.0–100.0)
MCV: 94.9 fL (ref 78.0–100.0)
PLATELETS: 260 10*3/uL (ref 150–400)
Platelets: 237 10*3/uL (ref 150–400)
RBC: 4.45 MIL/uL (ref 3.87–5.11)
RBC: 3.94 MIL/uL (ref 3.87–5.11)
RDW: 16 % — AB (ref 11.5–15.5)
RDW: 15.9 % — AB (ref 11.5–15.5)
WBC: 10.1 10*3/uL (ref 4.0–10.5)
WBC: 15.2 10*3/uL — ABNORMAL HIGH (ref 4.0–10.5)

## 2014-06-08 LAB — CBC WITH DIFFERENTIAL/PLATELET
BASOS PCT: 0 % (ref 0–1)
Basophils Absolute: 0 10*3/uL (ref 0.0–0.1)
EOS ABS: 0.1 10*3/uL (ref 0.0–0.7)
EOS PCT: 2 % (ref 0–5)
HCT: 45.2 % (ref 36.0–46.0)
Hemoglobin: 13.5 g/dL (ref 12.0–15.0)
Lymphocytes Relative: 17 % (ref 12–46)
Lymphs Abs: 1.4 10*3/uL (ref 0.7–4.0)
MCH: 28.4 pg (ref 26.0–34.0)
MCHC: 29.9 g/dL — AB (ref 30.0–36.0)
MCV: 95.2 fL (ref 78.0–100.0)
MONOS PCT: 9 % (ref 3–12)
Monocytes Absolute: 0.7 10*3/uL (ref 0.1–1.0)
Neutro Abs: 6.1 10*3/uL (ref 1.7–7.7)
Neutrophils Relative %: 72 % (ref 43–77)
PLATELETS: 253 10*3/uL (ref 150–400)
RBC: 4.75 MIL/uL (ref 3.87–5.11)
RDW: 15.8 % — ABNORMAL HIGH (ref 11.5–15.5)
WBC: 8.5 10*3/uL (ref 4.0–10.5)

## 2014-06-08 LAB — POC OCCULT BLOOD, ED: Fecal Occult Bld: POSITIVE — AB

## 2014-06-08 LAB — I-STAT CG4 LACTIC ACID, ED
Lactic Acid, Venous: 1.11 mmol/L (ref 0.5–2.0)
Lactic Acid, Venous: 1.59 mmol/L (ref 0.5–2.0)

## 2014-06-08 LAB — TYPE AND SCREEN
ABO/RH(D): O POS
Antibody Screen: NEGATIVE

## 2014-06-08 LAB — ABO/RH: ABO/RH(D): O POS

## 2014-06-08 LAB — PROTIME-INR
INR: 1.26 (ref 0.00–1.49)
PROTHROMBIN TIME: 16 s — AB (ref 11.6–15.2)

## 2014-06-08 MED ORDER — VANCOMYCIN HCL IN DEXTROSE 750-5 MG/150ML-% IV SOLN
750.0000 mg | INTRAVENOUS | Status: DC
  Filled 2014-06-08: qty 150

## 2014-06-08 MED ORDER — TRAMADOL HCL 50 MG PO TABS
25.0000 mg | ORAL_TABLET | Freq: Four times a day (QID) | ORAL | Status: DC | PRN
  Administered 2014-06-08 – 2014-06-10 (×2): 25 mg via ORAL
  Filled 2014-06-08 (×2): qty 1

## 2014-06-08 MED ORDER — SODIUM CHLORIDE 0.9 % IV BOLUS (SEPSIS)
1000.0000 mL | Freq: Once | INTRAVENOUS | Status: AC
  Administered 2014-06-08: 1000 mL via INTRAVENOUS

## 2014-06-08 MED ORDER — CYCLOSPORINE 0.05 % OP EMUL
1.0000 [drp] | Freq: Two times a day (BID) | OPHTHALMIC | Status: DC
  Administered 2014-06-08 – 2014-06-10 (×4): 1 [drp] via OPHTHALMIC
  Filled 2014-06-08 (×7): qty 1

## 2014-06-08 MED ORDER — ACETAMINOPHEN 325 MG PO TABS
650.0000 mg | ORAL_TABLET | ORAL | Status: DC | PRN
Start: 2014-06-08 — End: 2014-06-10
  Administered 2014-06-08 – 2014-06-09 (×2): 650 mg via ORAL
  Filled 2014-06-08 (×2): qty 2

## 2014-06-08 MED ORDER — SODIUM CHLORIDE 0.9 % IV BOLUS (SEPSIS)
500.0000 mL | Freq: Once | INTRAVENOUS | Status: AC
  Administered 2014-06-08: 500 mL via INTRAVENOUS

## 2014-06-08 MED ORDER — METOPROLOL SUCCINATE ER 25 MG PO TB24
25.0000 mg | ORAL_TABLET | Freq: Every day | ORAL | Status: DC
  Administered 2014-06-08 – 2014-06-10 (×3): 25 mg via ORAL
  Filled 2014-06-08 (×3): qty 1

## 2014-06-08 MED ORDER — FAMOTIDINE 20 MG PO TABS
20.0000 mg | ORAL_TABLET | Freq: Two times a day (BID) | ORAL | Status: DC
Start: 2014-06-08 — End: 2014-06-10
  Administered 2014-06-08 – 2014-06-10 (×5): 20 mg via ORAL
  Filled 2014-06-08 (×6): qty 1

## 2014-06-08 MED ORDER — PIPERACILLIN-TAZOBACTAM 3.375 G IVPB
3.3750 g | Freq: Three times a day (TID) | INTRAVENOUS | Status: DC
  Administered 2014-06-08 – 2014-06-09 (×3): 3.375 g via INTRAVENOUS
  Filled 2014-06-08 (×4): qty 50

## 2014-06-08 MED ORDER — FERROUS SULFATE 325 (65 FE) MG PO TABS
325.0000 mg | ORAL_TABLET | Freq: Two times a day (BID) | ORAL | Status: DC
  Administered 2014-06-08 – 2014-06-09 (×2): 325 mg via ORAL
  Filled 2014-06-08 (×4): qty 1

## 2014-06-08 MED ORDER — SODIUM CHLORIDE 0.9 % IV SOLN: INTRAVENOUS | Status: DC

## 2014-06-08 MED ORDER — PANTOPRAZOLE SODIUM 40 MG IV SOLR
40.0000 mg | Freq: Once | INTRAVENOUS | Status: AC
  Administered 2014-06-08: 40 mg via INTRAVENOUS
  Filled 2014-06-08: qty 40

## 2014-06-08 MED ORDER — PIPERACILLIN-TAZOBACTAM 3.375 G IVPB 30 MIN
3.3750 g | Freq: Once | INTRAVENOUS | Status: AC
  Administered 2014-06-08: 3.375 g via INTRAVENOUS
  Filled 2014-06-08: qty 50

## 2014-06-08 MED ORDER — POTASSIUM CL IN DEXTROSE 5% 20 MEQ/L IV SOLN
20.0000 meq | INTRAVENOUS | Status: DC
  Administered 2014-06-08 – 2014-06-09 (×2): 20 meq via INTRAVENOUS
  Filled 2014-06-08 (×5): qty 1000

## 2014-06-08 MED ORDER — VANCOMYCIN HCL IN DEXTROSE 1-5 GM/200ML-% IV SOLN
1000.0000 mg | Freq: Once | INTRAVENOUS | Status: AC
  Administered 2014-06-08: 1000 mg via INTRAVENOUS
  Filled 2014-06-08: qty 200

## 2014-06-08 NOTE — ED Notes (Signed)
Bed: WA07 Expected date:  Expected time:  Means of arrival:  Comments: Ems- elderly rectal bleeding

## 2014-06-08 NOTE — ED Notes (Signed)
Bed: WA29 Expected date:  Expected time:  Means of arrival:  Comments: Ding

## 2014-06-08 NOTE — ED Notes (Signed)
Admitting MD at bedside.

## 2014-06-08 NOTE — ED Notes (Signed)
Attempt to call report x2, RN receiving pt from OR, reports she will call back.

## 2014-06-08 NOTE — ED Notes (Signed)
I tried to get blood and was unsuccessful

## 2014-06-08 NOTE — ED Notes (Signed)
Attempted to call report x1, no answer. Will attempt again shortly.

## 2014-06-08 NOTE — ED Notes (Signed)
Per niece (POA), pressure ulcer on sacrum has been there for a good while along with the multiple wounds on bilateral feet. Niece states facility has been taking care of wounds, air boots in place on bilateral feet that were placed PTA.

## 2014-06-08 NOTE — Progress Notes (Signed)
ANTIBIOTIC CONSULT NOTE - INITIAL  Pharmacy Consult for Vancomycin & Zosyn Indication: Aspiration Pneumonia  Allergies  Allergen Reactions  . Statins Other (See Comments)    unknown    Patient Measurements: Height = 64 inches Weight = 52.8 kg (05/28/2014)   Vital Signs: Temp: 97.3 F (36.3 C) (03/15 1135) Temp Source: Oral (03/15 1135) BP: 158/71 mmHg (03/15 1304) Pulse Rate: 88 (03/15 1304) Intake/Output from previous day:   Intake/Output from this shift:    Labs:  Recent Labs  06/08/14 0830  WBC 8.5  HGB 13.5  PLT 253  CREATININE 0.72   Estimated Creatinine Clearance: 35.1 mL/min (by C-G formula based on Cr of 0.72). No results for input(s): VANCOTROUGH, VANCOPEAK, VANCORANDOM, GENTTROUGH, GENTPEAK, GENTRANDOM, TOBRATROUGH, TOBRAPEAK, TOBRARND, AMIKACINPEAK, AMIKACINTROU, AMIKACIN in the last 72 hours.   Microbiology: No results found for this or any previous visit (from the past 720 hour(s)).  Medical History: Past Medical History  Diagnosis Date  . Hyperlipidemia   . Hypertension   . Fibromyalgia   . Thyroid disease     hyperparathyroid  . PAD (peripheral artery disease)   . TIA (transient ischemic attack) ~ 2009    "only 1 that I know of" (12/13/2011)  . Atrial fibrillation     Not on Coumadin. Noted during 02/2009 admission.   . Skin cancer     Unknown types  . Schatzki's ring     Non obstructing ring on EGD 02/2009   . Pancreatic mass 02/2009    Abnormal CT abdomen, with cystic area in head and circumferential thickening of midbody of stomach  . Fibula fracture 12/11/2011    left  . Stroke 2006    "drags right leg very slightly" (12/13/2011)  . GERD (gastroesophageal reflux disease)   . H/O hiatal hernia   . Hard of hearing     "totally deaf in the right; 50% is gone in the left" (12/13/2011)    Medications:  Scheduled:  . cycloSPORINE  1 drop Both Eyes BID  . famotidine  20 mg Oral BID  . ferrous sulfate  325 mg Oral BID WC  . metoprolol  succinate  25 mg Oral Daily  . [START ON 06/09/2014] vancomycin  750 mg Intravenous Q24H   Infusions:  . dextrose 5 % with KCl 20 mEq / L 20 mEq (06/08/14 1130)  . piperacillin-tazobactam (ZOSYN)  IV    . sodium chloride     Assessment:  81 yr female with complaint of rectal bleeding.  H/O CVA, HTN, PAD, AFib (on eliquis), chronic respiratory failure.  Recent hospitalization for surgery for repair of femur fracture, and during the periop time off anticoag sustained another CVA.  Chest Xray concerning for aspiration pneumonia  Vancomycin 1gm x 1 given @ 11:38 and Zosyn 3.375gm x 1 given @ 10:46  Pharmacy consulted to dose Vancomycin and Zosyn for aspiration pneumonia  Blood, urine and sputum cultures ordered  CrCl ~ 35 ml/min  Goal of Therapy:  Vancomycin trough level 15-20 mcg/ml  Plan:  Measure antibiotic drug levels at steady state Follow up culture results  Zosyn 3.375gm IV q8h (each dose infused over 4 hrs) Vancomycin 750mg  IV q24h  Norwin Aleman, Toribio Harbour, PharmD 06/08/2014,2:45 PM

## 2014-06-08 NOTE — ED Provider Notes (Signed)
CSN: [id number]     Arrival date & time 06/08/14  0746 History   First MD Initiated Contact with Patient 06/08/14 0756     Chief Complaint  Patient presents with  . Rectal Bleeding     (Consider location/radiation/quality/duration/timing/severity/associated sxs/prior Treatment) The history is provided by the EMS personnel and the nursing home.  Pamela [name] is a 81 y.o. female hx of HL, afib on eliquis here with possible GI bleed. She had a recent stroke after a right femur ORIF in December last year. Patient was found to have some bright red blood per rectum yesterday so they held her Eliquis. She also had pneumonia 2 weeks ago she was hypernatremic and given some D5 and finished Avelox. Patient is on 5 L oxygen at baseline as per EMS her mental status also has baseline. Patient unable to give much history. Patient is DO NOT RESUSCITATE and has been considered for palliative care.     Level V caveat- dementia   Past Medical History  Diagnosis Date  . Hyperlipidemia   . Hypertension   . Fibromyalgia   . Thyroid disease     hyperparathyroid  . PAD (peripheral artery disease)   . TIA (transient ischemic attack) ~ 2009    "only 1 that I know of" (12/13/2011)  . Atrial fibrillation     Not on Coumadin. Noted during 02/2009 admission.   . Skin cancer     Unknown types  . Schatzki's ring     Non obstructing ring on EGD 02/2009   . Pancreatic mass 02/2009    Abnormal CT abdomen, with cystic area in head and circumferential thickening of midbody of stomach  . Fibula fracture 12/11/2011    left  . Stroke 2006    "drags right leg very slightly" (12/13/2011)  . GERD (gastroesophageal reflux disease)   . H/O hiatal hernia   . Hard of hearing     "totally deaf in the right; 50% is gone in the left" (12/13/2011)   Past Surgical History  Procedure Laterality Date  . Appendectomy  1940  . Parathyroidectomy  1980  . Vaginal hysterectomy  1970   Family History  Problem Relation Age of  Onset  . Cancer Other     breast ca  . Cancer      mouth ca    History  Substance Use Topics  . Smoking status: Never Smoker   . Smokeless tobacco: Never Used  . Alcohol Use: No   OB History    No data available     Review of Systems  Gastrointestinal: Positive for blood in stool and hematochezia.  All other systems reviewed and are negative.     Allergies  Statins  Home Medications   Prior to Admission medications   Medication Sig Start Date End Date Taking? Authorizing Provider  acetaminophen (TYLENOL) 500 MG tablet Take 650 mg by mouth every 4 (four) hours as needed for mild pain or fever.    Yes Historical Provider, MD  apixaban (ELIQUIS) 2.5 MG TABS tablet Take 1 tablet (2.5 mg total) by mouth every 12 (twelve) hours. 03/09/14  Yes Donne Hazel, MD  Ascorbic Acid (VITAMIN C) 500 MG tablet Take 500 mg by mouth daily.     Yes Historical Provider, MD  docusate sodium (COLACE) 100 MG capsule Take 100 mg by mouth 2 (two) times daily.   Yes Historical Provider, MD  ferrous sulfate 325 (65 FE) MG tablet Take 325 mg by mouth 2 (two)  times daily with a meal.   Yes Historical Provider, MD  furosemide (LASIX) 40 MG tablet Take 40 mg by mouth daily.   Yes Historical Provider, MD  losartan (COZAAR) 25 MG tablet Take 25 mg by mouth daily.   Yes Historical Provider, MD  metoprolol succinate (TOPROL-XL) 25 MG 24 hr tablet Take 25 mg by mouth daily.   Yes Historical Provider, MD  Multiple Vitamins-Minerals (DECUBI-VITE) CAPS Take 1 capsule by mouth daily.   Yes Historical Provider, MD  Nutritional Supplements (NUTRITIONAL SHAKE) LIQD Take 120 mLs by mouth 2 (two) times daily. Med Pass with 120 ml fluid (nectar thick) for additional hydration.   Yes Historical Provider, MD  OXYGEN Inhale 3 L into the lungs continuous.   Yes Historical Provider, MD  ranitidine (ZANTAC) 150 MG tablet Take 150 mg by mouth 2 (two) times daily.   Yes Historical Provider, MD  RESTASIS 0.05 % ophthalmic  emulsion Place 1 drop into both eyes 2 (two) times daily. 02/02/14  Yes Historical Provider, MD  sennosides-docusate sodium (SENOKOT-S) 8.6-50 MG tablet Take 2 tablets by mouth 2 (two) times daily.   Yes Historical Provider, MD  traMADol (ULTRAM) 50 MG tablet Take 25 mg by mouth every 6 (six) hours as needed for moderate pain.    Yes Historical Provider, MD  amoxicillin-clavulanate (AUGMENTIN) 400-57 MG per chewable tablet Chew 1 tablet by mouth 3 (three) times daily. For total 10 days    Historical Provider, MD  calcium carbonate (OS-CAL) 600 MG TABS Take 600 mg by mouth daily.      Historical Provider, MD  Calcium Carbonate-Vitamin D (CALCIUM-VITAMIN D) 500-200 MG-UNIT per tablet Take 1 tablet by mouth 2 (two) times daily.    Historical Provider, MD  ezetimibe (ZETIA) 10 MG tablet Take 1 tablet (10 mg total) by mouth daily. Patient not taking: Reported on 06/08/2014 03/04/14   Lucille Passy, MD  losartan (COZAAR) 100 MG tablet take 1 tablet by mouth daily Patient not taking: Reported on 06/08/2014 04/16/13   Lucille Passy, MD  magnesium oxide (MAG-OX) 400 (241.3 MG) MG tablet take 1 tablet by mouth once daily Patient not taking: Reported on 06/08/2014 03/02/13   Lucille Passy, MD  metoprolol tartrate (LOPRESSOR) 25 MG tablet take 1 tablet by mouth at bedtime Patient not taking: Reported on 06/08/2014 04/16/13   Lucille Passy, MD  Multiple Vitamins-Minerals (CENTRUM SILVER PO) Take 1 tablet by mouth daily.      Historical Provider, MD  senna (SENOKOT) 8.6 MG tablet Take 1 tablet by mouth daily. hold for loose bowels    Historical Provider, MD   BP 150/88 mmHg  Pulse 79  Temp(Src) 97.5 F (36.4 C) (Oral)  Resp 17  SpO2 99% Physical Exam  HENT:  Head: Normocephalic.  Mouth/Throat: Oropharynx is clear and moist.  Eyes: Pupils are equal, round, and reactive to light.  Conjunctiva pale   Neck: Normal range of motion. Neck supple.  Cardiovascular: Normal rate, regular rhythm and normal heart sounds.    Pulmonary/Chest: Effort normal.  Diminished breathsounds throughout   Abdominal: Soft. Bowel sounds are normal. She exhibits no distension. There is no tenderness. There is no rebound.  Genitourinary:  Blood at rectum. Occ positive   Musculoskeletal: Normal range of motion.  R femur scar healing well   Neurological: She is alert.  Demented, moving all extremities   Skin:  Stage 2 sacral ulcers   Psychiatric:  Unable   Nursing note and vitals reviewed.  ED Course  Procedures (including critical care time) Labs Review Labs Reviewed  CBC WITH DIFFERENTIAL/PLATELET - Abnormal; Notable for the following:    MCHC 29.9 (*)    RDW 15.8 (*)    All other components within normal limits  COMPREHENSIVE METABOLIC PANEL - Abnormal; Notable for the following:    Sodium 154 (*)    Potassium 3.4 (*)    CO2 36 (*)    Glucose, Bld 132 (*)    BUN 41 (*)    Albumin 3.1 (*)    AST 49 (*)    ALT 97 (*)    Alkaline Phosphatase 123 (*)    GFR calc non Af Amer 71 (*)    GFR calc Af Amer 82 (*)    All other components within normal limits  PROTIME-INR - Abnormal; Notable for the following:    Prothrombin Time 16.0 (*)    All other components within normal limits  POC OCCULT BLOOD, ED - Abnormal; Notable for the following:    Fecal Occult Bld POSITIVE (*)    All other components within normal limits  CULTURE, BLOOD (ROUTINE X 2)  CULTURE, BLOOD (ROUTINE X 2)  I-STAT CG4 LACTIC ACID, ED  TYPE AND SCREEN  ABO/RH    Imaging Review Dg Chest Port 1 View  06/08/2014   CLINICAL DATA:  Cough, altered mental status.  EXAM: PORTABLE CHEST - 1 VIEW  COMPARISON:  03/05/2014  FINDINGS: Patchy airspace opacity noted in the right mid and lower lung and to a lesser extent at the left base. Findings are concerning for pneumonia. Heart is normal size. Mediastinal contours are within a limits. No effusions. No acute bony abnormality.  IMPRESSION: Patchy right lung and left basilar airspace opacities  concerning for pneumonia.   Electronically Signed   By: Rolm Baptise M.D.   On: 06/08/2014 09:51     EKG Interpretation None      MDM   Final diagnoses:  Cough   Pamela [name] is a 81 y.o. female here with PRBPR. Patient DNR. Will get h/h and chemistry. Will likely not be too aggressive as she is palliative.   10:14 AM CBC stable. But Na 154. With bicarb 36. Consider metabolic compensation for respiratory acidosis. Patient has been having aspiration with water. CXR showed pneumonia. I discussed with great niece, who is the power of attorney. Patient DNR but ok with abx, no pressors. Given vanc/zosyn. Will admit for HCAP, hypernatremia, GI bleed.     Wandra Arthurs, MD 06/08/14 1016

## 2014-06-08 NOTE — ED Notes (Signed)
Pt from [facility]. Per staff noticed rectal bleeding last night and then again this morning. Sent to ER for further evaluation. Pt had recent surgery reduction of right femur and had CVA during this procedure. Per EMS pt is at baseline and is always on 5L O2 via [place].

## 2014-06-08 NOTE — H&P (Signed)
[facility] History and Physical  Pamela [name] NLG:[id number] DOB: 1916-10-25 DOA: 06/08/2014  Referring physician:  Shirlyn Goltz PCP:  Arnette Norris, MD   Chief Complaint:  Rectal bleeding  HPI:  The patient is a 81 y.o. year-old female with history of multiple strokes, HTN, HLD, PAD, a-fib on eliquis, GERD, hard of hearing, Schatzki's ring, multiple pressure ulcers on legs and sacrum, chronic respiratory failure for unknown reason on 6 L nasal cannula reportedly, who presents with rectal bleeding.  The patient was last at their baseline health in November of last year.  Prior to December, she was living independently, driving, paying her bills. In December, she had a stroke causing worsening left-sided weakness. She was discharged with skilled nursing facility and had a slow recovery. In early February, she had a fall resulting in a right femur fracture. She underwent surgery, but during the perioperative time in which she was off her anticoagulation she had another stroke. She has had problems with dysphagia and aspiration.  She has been on a pured diet with nectar thick liquids. She has had to have intermittent IV fluid secondary to dehydration.  She has been almost completely bedbound for the last month and a half and she has become progressively weaker. Over the last week, she has had no interest in participating in therapy.  Today, she was found with bright red blood in her diaper which was coming from her rectum. She was transported to the emergency department for evaluation.  In the emergency department, her vital signs were notable for mildly elevated blood pressure 158/71, otherwise stable. Labs her hemoglobin was 13.5, sodium 154, potassium 3.4, CO2 36, BUN 41, creatinine 0.72, INR 1.26, glucose 132. She did not receive her morning dose of Eliquis this morning.  Chest x-ray demonstrated patchy right and left basilar airspace opacities concerning for aspiration pneumonia. Because of her  hospitalization for fracture, then a half ago, she was started on vancomycin and Zosyn by the emergency department physician.  Review of Systems:  Patient denies pain, shortness of breath, nausea, abdominal pain. Per family member, she has coughing whenever she eats and drinks.  She has had a 12 pound weight loss in the last 2-3 months.  Past Medical History  Diagnosis Date  . Hyperlipidemia   . Hypertension   . Fibromyalgia   . Thyroid disease     hyperparathyroid  . PAD (peripheral artery disease)   . TIA (transient ischemic attack) ~ 2009    "only 1 that I know of" (12/13/2011)  . Atrial fibrillation     Not on Coumadin. Noted during 02/2009 admission.   . Skin cancer     Unknown types  . Schatzki's ring     Non obstructing ring on EGD 02/2009   . Pancreatic mass 02/2009    Abnormal CT abdomen, with cystic area in head and circumferential thickening of midbody of stomach  . Fibula fracture 12/11/2011    left  . Stroke 2006    "drags right leg very slightly" (12/13/2011)  . GERD (gastroesophageal reflux disease)   . H/O hiatal hernia   . Hard of hearing     "totally deaf in the right; 50% is gone in the left" (12/13/2011)   Past Surgical History  Procedure Laterality Date  . Appendectomy  1940  . Parathyroidectomy  1980  . Vaginal hysterectomy  1970   Social History:  reports that she has never smoked. She has never used smokeless tobacco. She reports that she does  not drink alcohol or use illicit drugs. [facility] skilled nursing facility, mostly bedbound  Allergies  Allergen Reactions  . Statins Other (See Comments)    unknown    Family History  Problem Relation Age of Onset  . Cancer Other     breast ca  . Cancer      mouth ca      Prior to Admission medications   Medication Sig Start Date End Date Taking? Authorizing Provider  acetaminophen (TYLENOL) 500 MG tablet Take 650 mg by mouth every 4 (four) hours as needed for mild pain or fever.    Yes Historical  Provider, MD  apixaban (ELIQUIS) 2.5 MG TABS tablet Take 1 tablet (2.5 mg total) by mouth every 12 (twelve) hours. 03/09/14  Yes Donne Hazel, MD  Ascorbic Acid (VITAMIN C) 500 MG tablet Take 500 mg by mouth daily.     Yes Historical Provider, MD  docusate sodium (COLACE) 100 MG capsule Take 100 mg by mouth 2 (two) times daily.   Yes Historical Provider, MD  ferrous sulfate 325 (65 FE) MG tablet Take 325 mg by mouth 2 (two) times daily with a meal.   Yes Historical Provider, MD  furosemide (LASIX) 40 MG tablet Take 40 mg by mouth daily.   Yes Historical Provider, MD  losartan (COZAAR) 25 MG tablet Take 25 mg by mouth daily.   Yes Historical Provider, MD  metoprolol succinate (TOPROL-XL) 25 MG 24 hr tablet Take 25 mg by mouth daily.   Yes Historical Provider, MD  Multiple Vitamins-Minerals (DECUBI-VITE) CAPS Take 1 capsule by mouth daily.   Yes Historical Provider, MD  Nutritional Supplements (NUTRITIONAL SHAKE) LIQD Take 120 mLs by mouth 2 (two) times daily. Med Pass with 120 ml fluid (nectar thick) for additional hydration.   Yes Historical Provider, MD  OXYGEN Inhale 3 L into the lungs continuous.   Yes Historical Provider, MD  ranitidine (ZANTAC) 150 MG tablet Take 150 mg by mouth 2 (two) times daily.   Yes Historical Provider, MD  RESTASIS 0.05 % ophthalmic emulsion Place 1 drop into both eyes 2 (two) times daily. 02/02/14  Yes Historical Provider, MD  sennosides-docusate sodium (SENOKOT-S) 8.6-50 MG tablet Take 2 tablets by mouth 2 (two) times daily.   Yes Historical Provider, MD  traMADol (ULTRAM) 50 MG tablet Take 25 mg by mouth every 6 (six) hours as needed for moderate pain.    Yes Historical Provider, MD  amoxicillin-clavulanate (AUGMENTIN) 400-57 MG per chewable tablet Chew 1 tablet by mouth 3 (three) times daily. For total 10 days    Historical Provider, MD  calcium carbonate (OS-CAL) 600 MG TABS Take 600 mg by mouth daily.      Historical Provider, MD  Calcium Carbonate-Vitamin D  (CALCIUM-VITAMIN D) 500-200 MG-UNIT per tablet Take 1 tablet by mouth 2 (two) times daily.    Historical Provider, MD  ezetimibe (ZETIA) 10 MG tablet Take 1 tablet (10 mg total) by mouth daily. Patient not taking: Reported on 06/08/2014 03/04/14   Lucille Passy, MD  losartan (COZAAR) 100 MG tablet take 1 tablet by mouth daily Patient not taking: Reported on 06/08/2014 04/16/13   Lucille Passy, MD  magnesium oxide (MAG-OX) 400 (241.3 MG) MG tablet take 1 tablet by mouth once daily Patient not taking: Reported on 06/08/2014 03/02/13   Lucille Passy, MD  metoprolol tartrate (LOPRESSOR) 25 MG tablet take 1 tablet by mouth at bedtime Patient not taking: Reported on 06/08/2014 04/16/13   Marciano Sequin  Deborra Medina, MD  Multiple Vitamins-Minerals (CENTRUM SILVER PO) Take 1 tablet by mouth daily.      Historical Provider, MD  senna (SENOKOT) 8.6 MG tablet Take 1 tablet by mouth daily. hold for loose bowels    Historical Provider, MD   Physical Exam: Filed Vitals:   06/08/14 1003 06/08/14 1135 06/08/14 1141 06/08/14 1304  BP: 150/88 143/57  158/71  Pulse: 79 87 86 88  Temp:  97.3 F (36.3 C)    TempSrc:  Oral    Resp: 17 16 17 17   SpO2: 99% 100% 100% 100%     General:  Cachectic female  Eyes:  PERRL, anicteric, non-injected.  ENT:  Nares clear.  Extremely dry MM  Neck:  Supple without TM or JVD.    Lymph:  No cervical, supraclavicular, or submandibular LAD.  Cardiovascular:  RRR, normal S1, S2, without m/r/g.  2+ pulses, warm extremities  Respiratory:  Coarse bilateral rales bilaterally, no wheezes, no rhonchi, without increased WOB.  Abdomen:  NABS.  Soft, ND/NT.  No palpable masses  Skin:  Multiple ecchymoses on legs, medial thighs, ankles and feet are really wrapped with Kerlix which will need to be changed on 3/16 due to multiple pressure ulcers. Prevalon boots in place  Musculoskeletal:  Decreased bulk and tone.  No LE edema.  Psychiatric:  Alert, able to answer a few questions, slow to  answer.  Neurologic:  No obvious facial droop, strength 4-5 right upper extremity, 3 out of 5 left upper extremity, 2 out of 5 bilateral lower extremities.    Labs on Admission:  Basic Metabolic Panel:  Recent Labs Lab 06/08/14 0830  NA 154*  K 3.4*  CL 109  CO2 36*  GLUCOSE 132*  BUN 41*  CREATININE 0.72  CALCIUM 9.0   Liver Function Tests:  Recent Labs Lab 06/08/14 0830  AST 49*  ALT 97*  ALKPHOS 123*  BILITOT 0.5  PROT 6.5  ALBUMIN 3.1*   No results for input(s): LIPASE, AMYLASE in the last 168 hours. No results for input(s): AMMONIA in the last 168 hours. CBC:  Recent Labs Lab 06/08/14 0830  WBC 8.5  NEUTROABS 6.1  HGB 13.5  HCT 45.2  MCV 95.2  PLT 253   Cardiac Enzymes: No results for input(s): CKTOTAL, CKMB, CKMBINDEX, TROPONINI in the last 168 hours.  BNP (last 3 results) No results for input(s): BNP in the last 8760 hours.  ProBNP (last 3 results) No results for input(s): PROBNP in the last 8760 hours.  CBG: No results for input(s): GLUCAP in the last 168 hours.  Radiological Exams on Admission: Dg Chest Port 1 View  06/08/2014   CLINICAL DATA:  Cough, altered mental status.  EXAM: PORTABLE CHEST - 1 VIEW  COMPARISON:  03/05/2014  FINDINGS: Patchy airspace opacity noted in the right mid and lower lung and to a lesser extent at the left base. Findings are concerning for pneumonia. Heart is normal size. Mediastinal contours are within a limits. No effusions. No acute bony abnormality.  IMPRESSION: Patchy right lung and left basilar airspace opacities concerning for pneumonia.   Electronically Signed   By: Rolm Baptise M.D.   On: 06/08/2014 09:51    EKG:  pending  Assessment/Plan Active Problems:   HCAP (healthcare-associated pneumonia)  ---  Chronic respiratory failure with likely aspiration pneumonia.  She is afebrile and without leukocytosis and has been aspirating frequently according to her family member -  Blood cultures pending -   Legionella, strep pneumo antigens -  Not producing sputum -  Continue vancomycin for 24 hours, but if afebrile, cultures negative, would stop and use monotherapy zosyn or unasyn for aspiration pneumonia -  Wean oxygen as tolerated  Rectal bleeding, likely lower GIB.  No hemorrhoids or masses palpable on exam per ER MD.  Patient is too frail to undergo colonoscopy at this time.   -  Cycle CBC q8h -  Type and screen -  Transfuse for hgb < 7 or hemodynamic instability with ongoing bleeding  Severe dehydration with hypernatremia, hypokalemia, elevated BUN, extremely dry mucous membranes -  Stop lasix and ARB -  Normal saline bolus 1L  -  D5 infusion with potassium -  Allow unthickened liquids.  Discussed increased risk of aspiration with niece, HPOA, but feel benefits outweigh risks.  -  Patient has previously stated she would not want a feeding tube  Dysphagia -  IVF -  Speech therapy assessment -  Pureed diet with thin liquids for now -  Have discussed minimizing patients oral medications > only essential medications due to risk of aspiration  Generalized weakness, weight loss, rapidly declining functional status -  Hospice and palliative care consult -  Patient has not had interest in therapies recently so will not reorder  HTN/HLD/a-fib, rate controlled -  CHADS2vasc:  5 -  Holding eliquis secondary to GIB -  F/u ECG -  Continue blood pressure and cholesterol medication at request of daughter for now except for losartan 2/2 risk of AKI from dehydration  Recurrently CVA -  D/c eliquis -  Continuing blood pressure and cholesterol medication  Fibular fracture 04/2014, nonweightbearing right leg  Multiple pressure ulcers -  Wound care consultation  Iron deficiency anemia -  Continue iron supplementation  Diet:  Dysphagia 1 with thin Access:  PIV IVF:  yes Proph:  SCDs   Code Status: DNR Family Communication: niece, HPOA Disposition Plan: Admit to med-surg.  Palliative  care consultation, possible hospice.  Time spent: 60 min Janece Canterbury [facility] Pager [phone]  If 7PM-7AM, please contact night-coverage [email or web address] Password [facility] 06/08/2014, 2:32 PM

## 2014-06-08 NOTE — Progress Notes (Signed)
Utilization Review completed.  Khylie Larmore RN CM  

## 2014-06-09 DIAGNOSIS — I10 Essential (primary) hypertension: Secondary | ICD-10-CM

## 2014-06-09 DIAGNOSIS — E870 Hyperosmolality and hypernatremia: Secondary | ICD-10-CM

## 2014-06-09 DIAGNOSIS — R531 Weakness: Secondary | ICD-10-CM

## 2014-06-09 DIAGNOSIS — I639 Cerebral infarction, unspecified: Secondary | ICD-10-CM

## 2014-06-09 LAB — CBC
HCT: 37.7 % (ref 36.0–46.0)
HEMATOCRIT: 37.4 % (ref 36.0–46.0)
HEMOGLOBIN: 11.1 g/dL — AB (ref 12.0–15.0)
Hemoglobin: 11 g/dL — ABNORMAL LOW (ref 12.0–15.0)
MCH: 27.8 pg (ref 26.0–34.0)
MCH: 28 pg (ref 26.0–34.0)
MCHC: 29.4 g/dL — ABNORMAL LOW (ref 30.0–36.0)
MCHC: 29.4 g/dL — ABNORMAL LOW (ref 30.0–36.0)
MCV: 94.7 fL (ref 78.0–100.0)
MCV: 95 fL (ref 78.0–100.0)
Platelets: 245 10*3/uL (ref 150–400)
Platelets: 235 10*3/uL (ref 150–400)
RBC: 3.95 MIL/uL (ref 3.87–5.11)
RBC: 3.97 MIL/uL (ref 3.87–5.11)
RDW: 15.9 % — ABNORMAL HIGH (ref 11.5–15.5)
RDW: 16 % — ABNORMAL HIGH (ref 11.5–15.5)
WBC: 14.4 10*3/uL — AB (ref 4.0–10.5)
WBC: 11.1 10*3/uL — ABNORMAL HIGH (ref 4.0–10.5)

## 2014-06-09 LAB — COMPREHENSIVE METABOLIC PANEL
ALT: 77 U/L — ABNORMAL HIGH (ref 0–35)
AST: 42 U/L — ABNORMAL HIGH (ref 0–37)
Albumin: 2.7 g/dL — ABNORMAL LOW (ref 3.5–5.2)
Alkaline Phosphatase: 97 U/L (ref 39–117)
Anion gap: 6 (ref 5–15)
BILIRUBIN TOTAL: 0.7 mg/dL (ref 0.3–1.2)
BUN: 28 mg/dL — AB (ref 6–23)
CALCIUM: 8.3 mg/dL — AB (ref 8.4–10.5)
CO2: 30 mmol/L (ref 19–32)
Chloride: 110 mmol/L (ref 96–112)
Creatinine, Ser: 0.64 mg/dL (ref 0.50–1.10)
GFR calc Af Amer: 85 mL/min — ABNORMAL LOW (ref >90)
GFR, EST NON AFRICAN AMERICAN: 73 mL/min — AB (ref >90)
Glucose, Bld: 127 mg/dL — ABNORMAL HIGH (ref 70–99)
Potassium: 3.2 mmol/L — ABNORMAL LOW (ref 3.5–5.1)
Sodium: 146 mmol/L — ABNORMAL HIGH (ref 135–145)
Total Protein: 5.4 g/dL — ABNORMAL LOW (ref 6.0–8.3)

## 2014-06-09 LAB — MAGNESIUM: Magnesium: 2.2 mg/dL (ref 1.5–2.5)

## 2014-06-09 LAB — HIV ANTIBODY (ROUTINE TESTING W REFLEX): HIV SCREEN 4TH GENERATION: NONREACTIVE

## 2014-06-09 LAB — PHOSPHORUS: Phosphorus: 2.5 mg/dL (ref 2.3–4.6)

## 2014-06-09 MED ORDER — CLINDAMYCIN PALMITATE HCL 75 MG/5ML PO SOLR
300.0000 mg | Freq: Three times a day (TID) | ORAL | Status: DC
  Administered 2014-06-09 – 2014-06-10 (×2): 300 mg via ORAL
  Filled 2014-06-09 (×10): qty 20

## 2014-06-09 NOTE — Evaluation (Signed)
Clinical/Bedside Swallow Evaluation Patient Details  Name: Pamela [name] MRN: [id number] Date of Birth: [birth year]  Today's Date: 06/09/2014 Time: SLP Start Time (ACUTE ONLY): 38 SLP Stop Time (ACUTE ONLY): 1453 SLP Time Calculation (min) (ACUTE ONLY): 43 min  Past Medical History:  Past Medical History  Diagnosis Date  . Hyperlipidemia   . Hypertension   . Fibromyalgia   . Thyroid disease     hyperparathyroid  . PAD (peripheral artery disease)   . TIA (transient ischemic attack) ~ 2009    "only 1 that I know of" (12/13/2011)  . Atrial fibrillation     Not on Coumadin. Noted during 02/2009 admission.   . Skin cancer     Unknown types  . Schatzki's ring     Non obstructing ring on EGD 02/2009   . Pancreatic mass 02/2009    Abnormal CT abdomen, with cystic area in head and circumferential thickening of midbody of stomach  . Fibula fracture 12/11/2011    left  . Stroke 2006    "drags right leg very slightly" (12/13/2011)  . GERD (gastroesophageal reflux disease)   . H/O hiatal hernia   . Hard of hearing     "totally deaf in the right; 50% is gone in the left" (12/13/2011)   Past Surgical History:  Past Surgical History  Procedure Laterality Date  . Appendectomy  1940  . Parathyroidectomy  1980  . Vaginal hysterectomy  1970   HPI:  81 yo female [place] admitted to [facility] with HCAP.  Pt with PMH + for several CVAs, Schatzki's ring, GERD, is bedbound and deconditioned.  Pt CXR concerning for aspiration pna.  Pt's neice *HCPOA, reported pt on puree/nectar diet at facility yet continues to cough.  Neice does not report less coughing with nectar vs thin.     Assessment / Plan / Recommendation Clinical Impression  Pt with chronic dysphagia/aspiration due to CVA with decreased tolerance likely due to bedridden status/deconditioning.  Pt did not phonate during evaluation.  Neice present during evaluation and expresses that pt coughs as much with nectar as thin liquids in her  opinion at facility.  Positioning is difficult for pt due to her CVA, head postioned leaning right.  She did accept small amounts of sherbert, nectar juice and thin tea.  Delayed oral transiting, oral swishing and throat clearing post swallow noted.  No overt symptoms of aspiration but pt is at high risk given multiple deficits.  Neice reports pt has been clearing her throat with intake for years.    Educated pt's family to precautions to mitigate aspiration risk and increase pt's comfort.  Suspect pt's dehydration risk is likely due to lack of access to liquids as neice reports pt readily consumes nectar liquids and is on free water protocol.  Advised to consider purchasing "sippy cup" and place within reach for pt to self administer as able.      Aspiration Risk  Severe    Diet Recommendation Thin liquid;Nectar-thick liquid;Dysphagia 1 (Puree) (nectar if increases comfort, decreases coughing, provided samples to family)   Liquid Administration via: Cup;Straw Medication Administration: Crushed with puree Supervision: Full supervision/cueing for compensatory strategies Compensations: Slow rate;Small sips/bites (observe pt to swallow before giving more) Postural Changes and/or Swallow Maneuvers: Seated upright 90 degrees;Upright 30-60 min after meal    Other  Recommendations Oral Care Recommendations: Oral care BID Other Recommendations: Have oral suction available   Follow Up Recommendations     n/a  Frequency and Duration  n/a   Pertinent Vitals/Pain Afebrile, decreased     Swallow Study Prior Functional Status   see Stamps Date of Onset: 06/09/14 HPI: 81 yo female from [place] admitted to [facility] with HCAP.  Pt with PMH + for several CVAs, Schatzki's ring, GERD, is bedbound and deconditioned.  Pt CXR concerning for aspiration pna.  Pt's neice *HCPOA, reported pt on puree/nectar diet at facility yet continues to cough.  Neice does not report less coughing with nectar vs  thin.   Type of Study: Bedside swallow evaluation Diet Prior to this Study: Dysphagia 1 (puree);Thin liquids Temperature Spikes Noted: No Respiratory Status: Room air Oral Cavity - Dentition: Adequate natural dentition Self-Feeding Abilities: Total assist Patient Positioning: Postural control interferes with function (head poorly positioned and pt leans right) Baseline Vocal Quality: Other (comment) (pt did not phonate) Volitional Cough: Cognitively unable to elicit Volitional Swallow: Unable to elicit    Oral/Motor/Sensory Function Overall Oral Motor/Sensory Function: Impaired at baseline Labial ROM: Reduced right Labial Symmetry: Abnormal symmetry right Lingual ROM: Reduced right Facial ROM: Reduced right Facial Symmetry: Right droop   Ice Chips Ice chips: Not tested   Thin Liquid Thin Liquid: Impaired Presentation: Cup;Straw;Spoon Oral Phase Impairments: Reduced labial seal;Reduced lingual movement/coordination;Impaired anterior to posterior transit Pharyngeal  Phase Impairments: Suspected delayed Swallow;Throat Clearing - Delayed;Throat Clearing - Immediate    Nectar Thick Nectar Thick Liquid: Impaired Presentation: Cup;Spoon Oral Phase Impairments: Reduced lingual movement/coordination;Impaired anterior to posterior transit;Reduced labial seal Oral phase functional implications: Prolonged oral transit Pharyngeal Phase Impairments: Suspected delayed Swallow;Throat Clearing - Immediate   Honey Thick Honey Thick Liquid: Not tested   Puree Puree: Within functional limits Presentation: Spoon   Solid   GO    Solid: Not tested Other Comments: neice reports pt tolerates pureed foods better than solids       Luanna Salk, [place] [facility] SLP [phone]

## 2014-06-09 NOTE — Progress Notes (Signed)
Pamela [name] EHU:[id number] DOB: [birth year] DOA: 06/08/2014 PCP: Arnette Norris, MD  Brief narrative:  81 y/o ? Prior CVA's, PAF on elliquis, GERD, Htn, HLD, PAD, Schatzki ring s/p dilatation @ age 89, Recent Acute R watershed infarct 03/09/14, chronic resp failure ? On 6 L.  Recent fall 04/2014 + R Femoral # + worsening dysphagia and apparent Adult FTT over 1-2 weeks PTA O2 admitted 06/08/14 c ? Aspiration PNA + Rectal bleed + AKI 2/2 to Rectal bleed. Note review reveals recommendation made 05/29/14 to family patient to persue Hopsice-Unfortunately Pt HCPOA Niece lost mother in the last week  Past medical history-As per Problem list Chart reviewed as below-   Consultants:  Palliative care  Procedures:    Antibiotics:  Vancomycin 3/15-3/16  Zosyn 3/15-3/16   Subjective   Aletr but non verbal "she occcasionally gets like this"-Niece No dark stool  No noted F overnight   Objective    Interim History:   Telemetry:    Objective: Filed Vitals:   06/08/14 1304 06/08/14 1709 06/08/14 2215 06/09/14 0540  BP: 158/71 133/54 131/57 111/49  Pulse: 88 87 77 77  Temp:  98.1 F (36.7 C) 97.8 F (36.6 C) 97.9 F (36.6 C)  TempSrc:  Axillary Axillary Axillary  Resp: 17 18 18 18   SpO2: 100% 98% 100% 93%    Intake/Output Summary (Last 24 hours) at 06/09/14 1218 Last data filed at 06/09/14 0959  Gross per 24 hour  Intake 1668.75 ml  Output      0 ml  Net 1668.75 ml    Exam:  General: eomi, ncat Cardiovascular: s1 s 2 no m/r/g Respiratory: clear, no added sound Abdomen: soft, NT/ND no gfrimace to palpation Skin no le edema Neuro non verbal, HOH  Data Reviewed: Basic Metabolic Panel:  Recent Labs Lab 06/08/14 0830 06/09/14 0231  NA 154* 146*  K 3.4* 3.2*  CL 109 110  CO2 36* 30  GLUCOSE 132* 127*  BUN 41* 28*  CREATININE 0.72 0.64  CALCIUM 9.0 8.3*  MG  --  2.2  PHOS  --  2.5   Liver Function Tests:  Recent Labs Lab 06/08/14 0830 06/09/14 0231    AST 49* 42*  ALT 97* 77*  ALKPHOS 123* 97  BILITOT 0.5 0.7  PROT 6.5 5.4*  ALBUMIN 3.1* 2.7*   No results for input(s): LIPASE, AMYLASE in the last 168 hours. No results for input(s): AMMONIA in the last 168 hours. CBC:  Recent Labs Lab 06/08/14 0830 06/08/14 1459 06/08/14 2015 06/09/14 0215 06/09/14 0845  WBC 8.5 10.1 15.2* 14.4* 11.1*  NEUTROABS 6.1  --   --   --   --   HGB 13.5 12.6 10.9* 11.0* 11.1*  HCT 45.2 42.7 37.4 37.4 37.7  MCV 95.2 96.0 94.9 94.7 95.0  PLT 253 260 237 245 235   Cardiac Enzymes: No results for input(s): CKTOTAL, CKMB, CKMBINDEX, TROPONINI in the last 168 hours. BNP: Invalid input(s): POCBNP CBG: No results for input(s): GLUCAP in the last 168 hours.  Recent Results (from the past 240 hour(s))  Blood culture (routine x 2)     Status: None (Preliminary result)   Collection Time: 06/08/14 10:35 AM  Result Value Ref Range Status   Specimen Description BLOOD RIGHT ARM  Final   Special Requests BOTTLES DRAWN AEROBIC AND ANAEROBIC 5ML  Final   Culture   Final           BLOOD CULTURE RECEIVED NO GROWTH TO DATE CULTURE WILL BE  HELD FOR 5 DAYS BEFORE ISSUING A FINAL NEGATIVE REPORT Performed at [facility]    Report Status PENDING  Incomplete  Blood culture (routine x 2)     Status: None (Preliminary result)   Collection Time: 06/08/14 10:35 AM  Result Value Ref Range Status   Specimen Description BLOOD LEFT FOREARM  Final   Special Requests BOTTLES DRAWN AEROBIC AND ANAEROBIC 5ML  Final   Culture   Final           BLOOD CULTURE RECEIVED NO GROWTH TO DATE CULTURE WILL BE HELD FOR 5 DAYS BEFORE ISSUING A FINAL NEGATIVE REPORT Performed at [facility]    Report Status PENDING  Incomplete     Studies:              All Imaging reviewed and is as per above notation   Scheduled Meds: . clindamycin  300 mg Oral 3 times per day  . cycloSPORINE  1 drop Both Eyes BID  . famotidine  20 mg Oral BID  . ferrous sulfate  325 mg Oral  BID WC  . metoprolol succinate  25 mg Oral Daily   Continuous Infusions: . dextrose 5 % with KCl 20 mEq / L 20 mEq (06/09/14 7711)     Assessment/Plan:  1. Aspiration PNA-CXR 1 vw 3/15 confirms aspiration.  Transition broad spectrum coverage-->PO Clindamycin soln..  CBC + Bmet am-dysphagia 2 diet.  D/c SLP eval-she is known to aspirate, and family is interested in Hospice 2. Volume depletion/AKI-resolving-family requests rpt lab x 1 more time in am.  Cont D5 saline at 50 cc per hour. If IV comes out, do not restart. DC ARB as some component of volume depletion secondary to this 3. ?Rectal bleed--Hb stable since admission 11 range.  Exam at bedside 3/16 shows unformed brown stool.  Risks> benefit Colonoscopy-No further w/u. 4. Afib, CHad2Vasc2 score=5-Elliquis on hold. Metoprolol XL 25 to continue 5. Fibular #-Hold brace to LLE as had developed Pressure sore-will not be very ambulatory i suspect moving forward 6. Adult FTT- [place] eligible-unlikely to make meaningful recovery 7. Anemia Fe def-d/c Iron-stools could have been dark/red due to this. 8. Elevated LFT's-? Etiology-no work-up at this stage   9. Elevated CBG-monitor-coverage only if CBG >220   Very poor and guarded prognosis-patient has had multiple hospitalizations and multiple events wherein she has demonstrated her failure to thrive. Family is interested in returning her to a rash in place nursing facility with hospice following. I will alert attending physician Dr. Bubba Camp as well as Mrs. Alfonse Spruce her nurse practitioner  We will aim for skilled nursing discharge with hospice following a.m. of 3/17  Verneita Griffes, MD  [facility] Pager [phone] 06/09/2014, 12:18 PM    LOS: 1 day

## 2014-06-09 NOTE — Consult Note (Addendum)
WOC wound consult note Reason for Consult: Consult requested for BLE and sacrum.  Family at bedside states she fell prior to admission and has a history of poor perfusion to feet in the past. Wound type: Left heel unstageable pressure ulcer; 2X2cm, 100% dry tightly adhered eschar, no odor or drainage. Right heel unstageable pressure ulcer; 5X4cm, 100% dry tightly adhered eschar, no odor or drainage. Right posterior foot achilles area full thickness; 6X3.5X.2cm, 80% red dry wound bed, 20% eschar, small amt red drainage, no odor. Right posterior calf full thickness; 6X1X.2cm, 80% red dry wound bed, 20% eschar, small amt red drainage, no odor Right outer foot deep tissue injury, 1X1cm Right outer ankle deep tissue injury, .8X.8cm Coccyx with protruding bone; difficult to reduce pressure.  Stage 3 wound 1X.8X.2cm with yellow wound bed, small amt yellow drainage, no odor. Area surrounding has red moist patchy areas of partail thickness skin loss and greyish-white skin, appearance consistent with moisture associated skin damage.  Pt has frequent small amts reddish drainage from rectum, according to family member at the bedside, and it is difficult to keep wound from becoming soiled due to the close location. Pressure Ulcer POA: Yes Dressing procedure/placement/frequency: Heels are floated to reduce pressure with Prevalon boots bilat.   Foam dressing to left heel; it is best practice to leave dry stable eschar intact.  Vaseline gauze or xeroform to right leg and foot wounds to promote moist healing. Foam dressing to coccyx to protect from further injury, barrier cream to surrounding area and buttocks to repel moisture. Discussed plan of care with family members at the bedside, they deny further questions. Please re-consult if further assistance is needed.  Thank-you,  Julien Girt MSN, [place], [place], [place], [place]

## 2014-06-09 NOTE — Clinical Social Work Psychosocial (Signed)
Clinical Social Work Department BRIEF PSYCHOSOCIAL ASSESSMENT 06/09/2014  Patient:  Pamela [name], Pamela [name]     Account Number:  [id number]     Admit date:  06/08/2014  Clinical Social Worker:  Daiva Huge  Date/Time:  06/09/2014 01:00 PM  Referred by:  Physician  Date Referred:  06/09/2014 Referred for  SNF Placement   Other Referral:   Interview type:  Family Other interview type:   SPOKE WITH Pamela [name] [phone]    PSYCHOSOCIAL DATA Living Status:  FACILITY Admitted from facility:  [facility] Level of care:  [place] Primary support name:  FAM Primary support relationship to patient:  FAMILY Degree of support available:   GOOD    CURRENT CONCERNS Current Concerns  Post-Acute Placement   Other Concerns:    SOCIAL WORK ASSESSMENT / PLAN CSW spoke with family who states patient has been residing at [facility] and plans to return at [facility]. "Her room is still available".  FAmily reports that they have met with Palliiative Care last week and are on board for return to SNF bed with Hospice at dc.  " we are prepared to get her back there with hospice". The Pamela niece states the patient has a lot of extended family   Assessment/plan status:  Other - See comment Other assessment/ plan:   update FL2 for return   Information/referral to community resources:   NA    PATIENT'S/FAMILY'S RESPONSE TO PLAN OF CARE: Family anticipates patient returning to SNF bed at [facility] at d/c as pta. THey are all comfortable with the plans for her to go back there with hospice care- lots of family live nearby and can visit which is reassuring to them.       Eduard Clos, MSW, [place]

## 2014-06-10 DIAGNOSIS — L89899 Pressure ulcer of other site, unspecified stage: Secondary | ICD-10-CM | POA: Diagnosis not present

## 2014-06-10 DIAGNOSIS — J690 Pneumonitis due to inhalation of food and vomit: Secondary | ICD-10-CM | POA: Diagnosis not present

## 2014-06-10 DIAGNOSIS — I639 Cerebral infarction, unspecified: Secondary | ICD-10-CM | POA: Diagnosis not present

## 2014-06-10 DIAGNOSIS — I481 Persistent atrial fibrillation: Secondary | ICD-10-CM | POA: Diagnosis not present

## 2014-06-10 DIAGNOSIS — E43 Unspecified severe protein-calorie malnutrition: Secondary | ICD-10-CM | POA: Diagnosis not present

## 2014-06-10 DIAGNOSIS — I69159 Hemiplegia and hemiparesis following nontraumatic intracerebral hemorrhage affecting unspecified side: Secondary | ICD-10-CM | POA: Diagnosis not present

## 2014-06-10 DIAGNOSIS — K625 Hemorrhage of anus and rectum: Secondary | ICD-10-CM | POA: Diagnosis not present

## 2014-06-10 DIAGNOSIS — I6789 Other cerebrovascular disease: Secondary | ICD-10-CM | POA: Diagnosis not present

## 2014-06-10 DIAGNOSIS — R404 Transient alteration of awareness: Secondary | ICD-10-CM | POA: Diagnosis not present

## 2014-06-10 DIAGNOSIS — R5381 Other malaise: Secondary | ICD-10-CM | POA: Diagnosis not present

## 2014-06-10 DIAGNOSIS — E860 Dehydration: Secondary | ICD-10-CM

## 2014-06-10 DIAGNOSIS — R1310 Dysphagia, unspecified: Secondary | ICD-10-CM | POA: Diagnosis not present

## 2014-06-10 DIAGNOSIS — L89302 Pressure ulcer of unspecified buttock, stage 2: Secondary | ICD-10-CM | POA: Diagnosis not present

## 2014-06-10 DIAGNOSIS — K219 Gastro-esophageal reflux disease without esophagitis: Secondary | ICD-10-CM | POA: Diagnosis not present

## 2014-06-10 DIAGNOSIS — R627 Adult failure to thrive: Secondary | ICD-10-CM | POA: Diagnosis not present

## 2014-06-10 DIAGNOSIS — R1312 Dysphagia, oropharyngeal phase: Secondary | ICD-10-CM | POA: Diagnosis not present

## 2014-06-10 LAB — CBC WITH DIFFERENTIAL/PLATELET
BASOS PCT: 1 % (ref 0–1)
Basophils Absolute: 0.1 10*3/uL (ref 0.0–0.1)
EOS PCT: 9 % — AB (ref 0–5)
Eosinophils Absolute: 0.8 10*3/uL — ABNORMAL HIGH (ref 0.0–0.7)
HCT: 33.8 % — ABNORMAL LOW (ref 36.0–46.0)
Hemoglobin: 10.1 g/dL — ABNORMAL LOW (ref 12.0–15.0)
LYMPHS PCT: 17 % (ref 12–46)
Lymphs Abs: 1.6 10*3/uL (ref 0.7–4.0)
MCH: 27.9 pg (ref 26.0–34.0)
MCHC: 29.9 g/dL — ABNORMAL LOW (ref 30.0–36.0)
MCV: 93.4 fL (ref 78.0–100.0)
MONO ABS: 1 10*3/uL (ref 0.1–1.0)
Monocytes Relative: 11 % (ref 3–12)
NEUTROS ABS: 5.9 10*3/uL (ref 1.7–7.7)
Neutrophils Relative %: 64 % (ref 43–77)
Platelets: 195 10*3/uL (ref 150–400)
RBC: 3.62 MIL/uL — ABNORMAL LOW (ref 3.87–5.11)
RDW: 16 % — ABNORMAL HIGH (ref 11.5–15.5)
WBC: 9.3 10*3/uL (ref 4.0–10.5)

## 2014-06-10 LAB — RENAL FUNCTION PANEL
ANION GAP: 8 (ref 5–15)
Albumin: 2.4 g/dL — ABNORMAL LOW (ref 3.5–5.2)
BUN: 27 mg/dL — ABNORMAL HIGH (ref 6–23)
CALCIUM: 8.5 mg/dL (ref 8.4–10.5)
CHLORIDE: 107 mmol/L (ref 96–112)
CO2: 31 mmol/L (ref 19–32)
Creatinine, Ser: 0.73 mg/dL (ref 0.50–1.10)
GFR calc Af Amer: 82 mL/min — ABNORMAL LOW (ref >90)
GFR, EST NON AFRICAN AMERICAN: 70 mL/min — AB (ref >90)
GLUCOSE: 101 mg/dL — AB (ref 70–99)
Phosphorus: 2.2 mg/dL — ABNORMAL LOW (ref 2.3–4.6)
Potassium: 3.4 mmol/L — ABNORMAL LOW (ref 3.5–5.1)
Sodium: 146 mmol/L — ABNORMAL HIGH (ref 135–145)

## 2014-06-10 LAB — OCCULT BLOOD X 1 CARD TO LAB, STOOL: FECAL OCCULT BLD: POSITIVE — AB

## 2014-06-10 MED ORDER — CLINDAMYCIN PALMITATE HCL 75 MG/5ML PO SOLR: 300.0000 mg | Freq: Three times a day (TID) | ORAL | Status: DC

## 2014-06-10 NOTE — Progress Notes (Signed)
Moderate amt of gelatinous bloody  stool at 0400

## 2014-06-10 NOTE — Clinical Social Work Note (Signed)
Patient for d/c today to SNF bed at  [facility] as pta. Family agreeable to this plan- she will return to SNF with hospice care. Family is comfortable and prepared for end of life care at [facility]- Will plan transfer via EMS. Eduard Clos, MSW, [place] [phone]

## 2014-06-10 NOTE — Discharge Summary (Signed)
Physician Discharge Summary  Pamela [name] OZH:[id number] DOB: 03/19/1917 DOA: 06/08/2014  PCP: Arnette Norris, MD  Admit date: 06/08/2014 Discharge date: 06/10/2014  Time spent: 35 minutes  Recommendations for Outpatient Follow-up:  1. Recommend HOSPICE follow patient at [facility]-Prognosis is poor 2. Consider Knee Brace if needed given history of recent surgery 3. I have narrowed multiple medications off of her MAR which are not necessary at-please review carefully 4. Deferred to discretion of nursing home M.D./PA regarding use of clindamycin on discharge--ideally she should be treated a/25/16 for 10 days coverage for aspiration   Discharge Diagnoses:  Active Problems:   Essential hypertension   PAROXYSMAL ATRIAL FIBRILLATION   Weakness generalized   Stroke   CVA (cerebral infarction)   Dysphagia   Dehydration   Hypernatremia   Aspiration pneumonia   Discharge Condition: Guarded   Diet recommendation: Nectar thickened dysphagia 1-2 diet as per speech therapist    There were no vitals filed for this visit.  History of present illness:  81 y/o ? Prior CVA's, PAF on elliquis, GERD, Htn, HLD, PAD, Schatzki ring s/p dilatation @ age 81, Recent Acute R watershed infarct 03/09/14, chronic resp failure ? On 6 L. Recent fall 04/2014 + R Femoral # + worsening dysphagia and apparent Adult FTT over 1-2 weeks PTA O2 admitted 06/08/14 c ? Aspiration PNA + Rectal bleed + AKI 2/2 to Rectal bleed. Note review reveals recommendation made 05/29/14 to family patient to persue Hopsice- Patient was admitted to the hospital 06/08/14 with submassive rectal bleed of unknown etiology hemoglobin on admission 12.6 It was felt that this was a small diverticular/internal hemorrhoidal bleedin a setting of chronic anticoagulation for A. fib with a chadVasc score of 5 -her hematocrit stayed in the 10-11 range throughoutHer hospital stay despite multiple episodes of bleeding It was also noted that on  chest x-ray 1 view 06/08/14 she had right upper lobe consolidations consistent with aspiration pneumonia-she had a non-Augmentin prior to admission and had been on a dysphagia 2 diet.   on admission also developed acute kidney injury-note that patient was on ARB/Lasix as well We had a long discussion at the bedside with patient's healthcare power of attorney Edwin Dada and it was ultimately decided that risks >benefits regarding workup for bleeding/other aggressive medical therapies. Patient ultimately discharged back to skilled nursing facility 06/10/14  with hospice to follow at facility Patient made full comfort care Multiple medications discontinued including Elliquis Expected prognosis is very poor   Discharge Exam: Filed Vitals:   06/10/14 0538  BP: 127/42  Pulse: 70  Temp: 98.2 F (36.8 C)  Resp: 18    patient passed large amounts of stool in the bed overnight which seemed blood-tinged Patient is alert but confused  EOMI NCAT   S1-S2 no murmur rub or gallop   stage I-II decubitus ulcer Multiple ulcerations over her lower extremities  Discharge Instructions    Current Discharge Medication List    START taking these medications   Details  clindamycin (CLEOCIN) 75 MG/5ML solution Take 20 mLs (300 mg total) by mouth every 8 (eight) hours. Qty: 100 mL, Refills: 0      CONTINUE these medications which have NOT CHANGED   Details  acetaminophen (TYLENOL) 500 MG tablet Take 650 mg by mouth every 4 (four) hours as needed for mild pain or fever.     metoprolol succinate (TOPROL-XL) 25 MG 24 hr tablet Take 25 mg by mouth daily.    OXYGEN Inhale 3 L into  the lungs continuous.    traMADol (ULTRAM) 50 MG tablet Take 25 mg by mouth every 6 (six) hours as needed for moderate pain.       STOP taking these medications     apixaban (ELIQUIS) 2.5 MG TABS tablet      Ascorbic Acid (VITAMIN C) 500 MG tablet      docusate sodium (COLACE) 100 MG capsule      ferrous sulfate 325  (65 FE) MG tablet      furosemide (LASIX) 40 MG tablet      losartan (COZAAR) 25 MG tablet      Multiple Vitamins-Minerals (DECUBI-VITE) CAPS      Nutritional Supplements (NUTRITIONAL SHAKE) LIQD      ranitidine (ZANTAC) 150 MG tablet      RESTASIS 0.05 % ophthalmic emulsion      sennosides-docusate sodium (SENOKOT-S) 8.6-50 MG tablet      amoxicillin-clavulanate (AUGMENTIN) 400-57 MG per chewable tablet      calcium carbonate (OS-CAL) 600 MG TABS      Calcium Carbonate-Vitamin D (CALCIUM-VITAMIN D) 500-200 MG-UNIT per tablet      ezetimibe (ZETIA) 10 MG tablet      losartan (COZAAR) 100 MG tablet      magnesium oxide (MAG-OX) 400 (241.3 MG) MG tablet      metoprolol tartrate (LOPRESSOR) 25 MG tablet      Multiple Vitamins-Minerals (CENTRUM SILVER PO)      senna (SENOKOT) 8.6 MG tablet        Allergies  Allergen Reactions  . Statins Other (See Comments)    unknown      The results of significant diagnostics from this hospitalization (including imaging, microbiology, ancillary and laboratory) are listed below for reference.    Significant Diagnostic Studies: Dg Chest Port 1 View  06/08/2014   CLINICAL DATA:  Cough, altered mental status.  EXAM: PORTABLE CHEST - 1 VIEW  COMPARISON:  03/05/2014  FINDINGS: Patchy airspace opacity noted in the right mid and lower lung and to a lesser extent at the left base. Findings are concerning for pneumonia. Heart is normal size. Mediastinal contours are within a limits. No effusions. No acute bony abnormality.  IMPRESSION: Patchy right lung and left basilar airspace opacities concerning for pneumonia.   Electronically Signed   By: Rolm Baptise M.D.   On: 06/08/2014 09:51    Microbiology: Recent Results (from the past 240 hour(s))  Blood culture (routine x 2)     Status: None (Preliminary result)   Collection Time: 06/08/14 10:35 AM  Result Value Ref Range Status   Specimen Description BLOOD RIGHT ARM  Final   Special Requests  BOTTLES DRAWN AEROBIC AND ANAEROBIC 5ML  Final   Culture   Final           BLOOD CULTURE RECEIVED NO GROWTH TO DATE CULTURE WILL BE HELD FOR 5 DAYS BEFORE ISSUING A FINAL NEGATIVE REPORT Performed at [facility]    Report Status PENDING  Incomplete  Blood culture (routine x 2)     Status: None (Preliminary result)   Collection Time: 06/08/14 10:35 AM  Result Value Ref Range Status   Specimen Description BLOOD LEFT FOREARM  Final   Special Requests BOTTLES DRAWN AEROBIC AND ANAEROBIC 5ML  Final   Culture   Final           BLOOD CULTURE RECEIVED NO GROWTH TO DATE CULTURE WILL BE HELD FOR 5 DAYS BEFORE ISSUING A FINAL NEGATIVE REPORT Performed at [facility]  Report Status PENDING  Incomplete     Labs: Basic Metabolic Panel:  Recent Labs Lab 06/08/14 0830 06/09/14 0231 06/10/14 0510  NA 154* 146* 146*  K 3.4* 3.2* 3.4*  CL 109 110 107  CO2 36* 30 31  GLUCOSE 132* 127* 101*  BUN 41* 28* 27*  CREATININE 0.72 0.64 0.73  CALCIUM 9.0 8.3* 8.5  MG  --  2.2  --   PHOS  --  2.5 2.2*   Liver Function Tests:  Recent Labs Lab 06/08/14 0830 06/09/14 0231 06/10/14 0510  AST 49* 42*  --   ALT 97* 77*  --   ALKPHOS 123* 97  --   BILITOT 0.5 0.7  --   PROT 6.5 5.4*  --   ALBUMIN 3.1* 2.7* 2.4*   No results for input(s): LIPASE, AMYLASE in the last 168 hours. No results for input(s): AMMONIA in the last 168 hours. CBC:  Recent Labs Lab 06/08/14 0830 06/08/14 1459 06/08/14 2015 06/09/14 0215 06/09/14 0845 06/10/14 0510  WBC 8.5 10.1 15.2* 14.4* 11.1* 9.3  NEUTROABS 6.1  --   --   --   --  5.9  HGB 13.5 12.6 10.9* 11.0* 11.1* 10.1*  HCT 45.2 42.7 37.4 37.4 37.7 33.8*  MCV 95.2 96.0 94.9 94.7 95.0 93.4  PLT 253 260 237 245 235 195   Cardiac Enzymes: No results for input(s): CKTOTAL, CKMB, CKMBINDEX, TROPONINI in the last 168 hours. BNP: BNP (last 3 results) No results for input(s): BNP in the last 8760 hours.  ProBNP (last 3 results) No  results for input(s): PROBNP in the last 8760 hours.  CBG: No results for input(s): GLUCAP in the last 168 hours.     SignedNita Sells  [facility] 06/10/2014, 8:40 AM

## 2014-06-11 ENCOUNTER — Non-Acute Institutional Stay (SKILLED_NURSING_FACILITY): Payer: Medicare Other | Admitting: Internal Medicine

## 2014-06-11 DIAGNOSIS — I482 Chronic atrial fibrillation, unspecified: Secondary | ICD-10-CM

## 2014-06-11 DIAGNOSIS — N179 Acute kidney failure, unspecified: Secondary | ICD-10-CM | POA: Diagnosis not present

## 2014-06-11 DIAGNOSIS — J690 Pneumonitis due to inhalation of food and vomit: Secondary | ICD-10-CM | POA: Diagnosis not present

## 2014-06-11 DIAGNOSIS — R5381 Other malaise: Secondary | ICD-10-CM

## 2014-06-11 DIAGNOSIS — K625 Hemorrhage of anus and rectum: Secondary | ICD-10-CM | POA: Diagnosis not present

## 2014-06-11 DIAGNOSIS — R627 Adult failure to thrive: Secondary | ICD-10-CM | POA: Diagnosis not present

## 2014-06-11 DIAGNOSIS — I639 Cerebral infarction, unspecified: Secondary | ICD-10-CM

## 2014-06-11 DIAGNOSIS — S7291XS Unspecified fracture of right femur, sequela: Secondary | ICD-10-CM | POA: Diagnosis not present

## 2014-06-11 DIAGNOSIS — R1310 Dysphagia, unspecified: Secondary | ICD-10-CM | POA: Diagnosis not present

## 2014-06-11 DIAGNOSIS — E43 Unspecified severe protein-calorie malnutrition: Secondary | ICD-10-CM

## 2014-06-11 NOTE — Progress Notes (Signed)
Patient ID: Pamela [name], female   DOB: Jun 16, 1916, 81 y.o.   MRN: [id number]     [facility]   PCP: Arnette Norris, MD  Code Status: DNR  Allergies  Allergen Reactions  . Statins Other (See Comments)    unknown    Chief Complaint  Patient presents with  . Readmit To SNF     HPI:  81 year old patient is here for short term rehabilitation post hospital admission from 06/08/14-06/10/14 with rectal bleed. It was thought to be from her being on anticoagulation vs diverticular bleed/ internal hemorrhoid. Her hemoglobin remained stable in the hospital. She did not require any transfusion. She was also diagnosed to have aspiration pneumonia and AKI. Given her overall poor prognosis, goals of care were revisited in the hospital and comfort care was decided upon. Her eliquis and several other medications were discontinued and she was sent back to the facility. She has PMH of CVA, afib on elliquis, GERD, HTN, HLD, PAD, Schatzki ring s/p dilatation and chronic respiratory failure, dysphagia and recent right femoral fracture. She is seen in her room today. She is frail, ill appearing and unable to participate in HPI or ROS   Review of Systems:  Unable to obtain    Past Medical History  Diagnosis Date  . Hyperlipidemia   . Hypertension   . Fibromyalgia   . Thyroid disease     hyperparathyroid  . PAD (peripheral artery disease)   . TIA (transient ischemic attack) ~ 2009    "only 1 that I know of" (12/13/2011)  . Atrial fibrillation     Not on Coumadin. Noted during 02/2009 admission.   . Skin cancer     Unknown types  . Schatzki's ring     Non obstructing ring on EGD 02/2009   . Pancreatic mass 02/2009    Abnormal CT abdomen, with cystic area in head and circumferential thickening of midbody of stomach  . Fibula fracture 12/11/2011    left  . Stroke 2006    "drags right leg very slightly" (12/13/2011)  . GERD (gastroesophageal reflux disease)   . H/O  hiatal hernia   . Hard of hearing     "totally deaf in the right; 50% is gone in the left" (12/13/2011)   Past Surgical History  Procedure Laterality Date  . Appendectomy  1940  . Parathyroidectomy  1980  . Vaginal hysterectomy  1970   Social History:   reports that she has never smoked. She has never used smokeless tobacco. She reports that she does not drink alcohol or use illicit drugs.  Family History  Problem Relation Age of Onset  . Cancer Other     breast ca  . Cancer      mouth ca     Medications: Patient's Medications  New Prescriptions   No medications on file  Previous Medications   ACETAMINOPHEN (TYLENOL) 500 MG TABLET    Take 650 mg by mouth every 4 (four) hours as needed for mild pain or fever.    CLINDAMYCIN (CLEOCIN) 75 MG/5ML SOLUTION    Take 20 mLs (300 mg total) by mouth every 8 (eight) hours.   METOPROLOL SUCCINATE (TOPROL-XL) 25 MG 24 HR TABLET    Take 25 mg by mouth daily.   OXYGEN    Inhale 3 L into the lungs continuous.   RANITIDINE (ZANTAC) 150 MG TABLET    Take 150 mg by mouth at bedtime.    TRAMADOL (ULTRAM) 50  MG TABLET    Take 25 mg by mouth every 6 (six) hours as needed for moderate pain.   Modified Medications   No medications on file  Discontinued Medications   No medications on file     Physical Exam: Filed Vitals:   06/11/14 1104  BP: 127/67  Pulse: 75  Temp: 97.9 F (36.6 C)  Resp: 18  SpO2: 95%    General- elderly female, frail and thin built, in no acute distress Head- normocephalic, atraumatic Throat- moist mucus membrane  Eyes- no pallor, no icterus Neck- no cervical lymphadenopathy Cardiovascular- normal s1,s2, no murmurs, trace leg edema Respiratory- bilateral poor air entry, no wheeze, no rhonchi, no crackles, no use of accessory muscles Abdomen- bowel sounds present, soft, non tender Musculoskeletal- able to move all 4 extremities, right leg immobilizer in place Neurological- unable to assess as pt unable to  participate Skin- warm and dry, decubitus ulcer in sacrum and multiple lower extremity ulcers   Labs reviewed: Basic Metabolic Panel:  Recent Labs  06/08/14 0830 06/09/14 0231 06/10/14 0510  NA 154* 146* 146*  K 3.4* 3.2* 3.4*  CL 109 110 107  CO2 36* 30 31  GLUCOSE 132* 127* 101*  BUN 41* 28* 27*  CREATININE 0.72 0.64 0.73  CALCIUM 9.0 8.3* 8.5  MG  --  2.2  --   PHOS  --  2.5 2.2*   Liver Function Tests:  Recent Labs  03/06/14 1300 06/08/14 0830 06/09/14 0231 06/10/14 0510  AST 32 49* 42*  --   ALT 19 97* 77*  --   ALKPHOS 80 123* 97  --   BILITOT 0.5 0.5 0.7  --   PROT 6.9 6.5 5.4*  --   ALBUMIN 3.9 3.1* 2.7* 2.4*   No results for input(s): LIPASE, AMYLASE in the last 8760 hours. No results for input(s): AMMONIA in the last 8760 hours. CBC:  Recent Labs  03/06/14 1300  06/08/14 0830  06/09/14 0215 06/09/14 0845 06/10/14 0510  WBC 5.8  < > 8.5  < > 14.4* 11.1* 9.3  NEUTROABS 4.4  --  6.1  --   --   --  5.9  HGB 13.6  < > 13.5  < > 11.0* 11.1* 10.1*  HCT 41.9  < > 45.2  < > 37.4 37.7 33.8*  MCV 96.5  < > 95.2  < > 94.7 95.0 93.4  PLT 188  < > 253  < > 245 235 195  < > = values in this interval not displayed.   Assessment/Plan  Physical deconditioning From her recent fracture, co-morbidities, protein calorie malnutrition and failure to thrive. Poor prognosis, decline anticipated. Hospice consult  Severe protein calorie malnutrition Has multiple leg ulcer and decubitus ulcer. Frail. Encourage po intake as tolerated. Overall poor prognosis. Continue wound care and pressure ulcer prophylaxis.   Failure to thrive Progressive decline anticipated, po feed as tolerated, comfort care is the main goal of care. Will get hospice consult  Rectal bleed None in facility, off eliquis, monitor clinically  CVA Continue toprol xl, off eliquis, monitor clinically  PAF Rate controlled, off eliquis for now. Continue toprol xl  Acute renal failure Encourage  hydration as tolerated  Right femoral fracture Continue knee immobilizer and tramadol for pain.  Dysphagia With high aspiration risk, continue pureed and nectar thick liquid diet, aspiration precautions, assistance with feeding  Aspiration pneumonia Patient to complete her 10 days course of clindamycin, aspiration precautions, dysphagia diet  gerd Continue ranitidine for now and  monitor   Goals of care: short term rehabilitation but possible long term care with overall poor prognosis   Labs/tests ordered: none  Family/ staff Communication: reviewed care plan with nursing supervisor    Blanchie Serve, MD  [facility] [phone] (Monday-Friday 8 am - 5 pm) [phone] (afterhours)

## 2014-06-14 LAB — CULTURE, BLOOD (ROUTINE X 2)
Culture: NO GROWTH
Culture: NO GROWTH

## 2014-06-21 DIAGNOSIS — E43 Unspecified severe protein-calorie malnutrition: Secondary | ICD-10-CM | POA: Insufficient documentation

## 2014-06-21 DIAGNOSIS — R5381 Other malaise: Secondary | ICD-10-CM | POA: Insufficient documentation

## 2014-06-21 DIAGNOSIS — S7290XA Unspecified fracture of unspecified femur, initial encounter for closed fracture: Secondary | ICD-10-CM | POA: Insufficient documentation

## 2014-06-21 DIAGNOSIS — K625 Hemorrhage of anus and rectum: Secondary | ICD-10-CM | POA: Insufficient documentation

## 2014-06-21 DIAGNOSIS — R627 Adult failure to thrive: Secondary | ICD-10-CM | POA: Insufficient documentation

## 2014-06-22 DIAGNOSIS — R404 Transient alteration of awareness: Secondary | ICD-10-CM | POA: Diagnosis not present

## 2014-06-25 ENCOUNTER — Telehealth: Payer: Self-pay | Admitting: *Deleted

## 2014-06-25 NOTE — Telephone Encounter (Signed)
Edwin Dada, POA called and wanted to speak with Dr. Bubba Camp regarding patient at [facility]. Directed her to call [facility] because patient is not seen here in office. She agreed.

## 2014-06-28 ENCOUNTER — Non-Acute Institutional Stay (SKILLED_NURSING_FACILITY): Payer: Medicare Other | Admitting: Registered Nurse

## 2014-06-28 DIAGNOSIS — M25561 Pain in right knee: Secondary | ICD-10-CM | POA: Diagnosis not present

## 2014-06-29 ENCOUNTER — Encounter: Payer: Self-pay | Admitting: Registered Nurse

## 2014-06-29 NOTE — Progress Notes (Signed)
Patient ID: Pamela [name], female   DOB: 10-06-1916, 81 y.o.   MRN: [id number]   Place of Service: [facility]  Allergies  Allergen Reactions  . Statins Other (See Comments)    unknown    Code Status: DNR  Goals of Care: Comfort and Quality of Life/Palliative   Chief Complaint  Patient presents with  . Acute Visit    right knee pain    HPI 81 y.o. female with PMH of HTN, HLD, fibromyalgia, paroxysmal afib, PAD, CVA,  right distal femoral diaphysis fracture s/p ORIF on 04/30/14, FTT, GI bleed among others is being seen for an acute visit at the request of director of nursing for the evaluation of ?RLE contracture. Per nursing staff, patient has been guarding her RLE x 1 day. Unable to straighten her right leg out secondary to resistance/guarding. No recent fall or inuries reported. Seen in room today. Unable to fully participate in hpi or ros due to dementia, but denies any pain or discomfort.     Past Medical History  Diagnosis Date  . Hyperlipidemia   . Hypertension   . Fibromyalgia   . Thyroid disease     hyperparathyroid  . PAD (peripheral artery disease)   . TIA (transient ischemic attack) ~ 2009    "only 1 that I know of" (12/13/2011)  . Atrial fibrillation     Not on Coumadin. Noted during 02/2009 admission.   . Skin cancer     Unknown types  . Schatzki's ring     Non obstructing ring on EGD 02/2009   . Pancreatic mass 02/2009    Abnormal CT abdomen, with cystic area in head and circumferential thickening of midbody of stomach  . Fibula fracture 12/11/2011    left  . Stroke 2006    "drags right leg very slightly" (12/13/2011)  . GERD (gastroesophageal reflux disease)   . H/O hiatal hernia   . Hard of hearing     "totally deaf in the right; 50% is gone in the left" (12/13/2011)    Past Surgical History  Procedure Laterality Date  . Appendectomy  1940  . Parathyroidectomy  1980  . Vaginal hysterectomy  1970    History  Substance Use Topics  .  Smoking status: Never Smoker   . Smokeless tobacco: Never Used  . Alcohol Use: No    Family History  Problem Relation Age of Onset  . Cancer Other     breast ca  . Cancer      mouth ca       Medication List       This list is accurate as of: 06/28/14 11:59 PM.  Always use your most recent med list.               acetaminophen 500 MG tablet  Commonly known as:  TYLENOL  Take 650 mg by mouth 2 (two) times daily.     metoprolol succinate 25 MG 24 hr tablet  Commonly known as:  TOPROL-XL  Take 25 mg by mouth daily.     OXYGEN  Inhale 3 L into the lungs continuous.     ranitidine 150 MG tablet  Commonly known as:  ZANTAC  Take 150 mg by mouth at bedtime.     traMADol 50 MG tablet  Commonly known as:  ULTRAM  Take 25 mg by mouth every 6 (six) hours as needed for moderate pain.        Physical Exam  BP 147/66 mmHg  Pulse 74  Temp(Src) 97.8 F (36.6 C)  Resp 17  SpO2 99%  Constitutional: frail elderly female in no acute distress. Conversant and pleasant. HEENT: Normocephalic and atraumatic. PERRL. EOM intact. No scleral icterus. Left ear hearing aid in place. Has hearing impairment.   Neck: No JVD or carotid bruits. Cardiac: Normal S1, S2. RRR without appreciable murmurs, rubs, or gallops. Distal pulses intact. Trace dependent edema.  Lungs: No respiratory distress. Breath sounds clear bilaterally without rales, rhonchi, or wheezes. Abdomen: Audible bowel sounds in all quadrants. Soft, nontender, nondistended. Musculoskeletal: Right knee frozen in flexion about 45-60 degrees. Anterior knee joint tender to palpation. Able to straighten out to ~10-20 degrees in flexion. No signs of injuries noted.  Skin: Warm and dry.  Psychiatric: appropriate mood and affect for situation   Assessment & Plan 1. Right knee pain No signs of injuries noted. Patient reported having pain with movement and palpation of anterior right knee joint. Righ knee joint frozen in flexion most  likely related OA pain. Start tylenol 650mg  twice daily for now. Reassess and continue to monitor.    Family/Staff Communication Plan of care discussed with nursing staff.  Nursing staff verbalized understanding and agree with plan of care. No additional questions or concerns reported.    Arthur Holms, MSN, AGNP-C [facility] [address] [place], [place] [address] [phone] [8am-5pm] After hours: [phone]

## 2014-07-12 ENCOUNTER — Non-Acute Institutional Stay (SKILLED_NURSING_FACILITY): Payer: Medicare Other | Admitting: Registered Nurse

## 2014-07-12 ENCOUNTER — Encounter: Payer: Self-pay | Admitting: Registered Nurse

## 2014-07-12 DIAGNOSIS — D62 Acute posthemorrhagic anemia: Secondary | ICD-10-CM

## 2014-07-12 DIAGNOSIS — R1310 Dysphagia, unspecified: Secondary | ICD-10-CM

## 2014-07-12 DIAGNOSIS — M25561 Pain in right knee: Secondary | ICD-10-CM

## 2014-07-12 DIAGNOSIS — L8990 Pressure ulcer of unspecified site, unspecified stage: Secondary | ICD-10-CM

## 2014-07-12 DIAGNOSIS — R627 Adult failure to thrive: Secondary | ICD-10-CM

## 2014-07-12 DIAGNOSIS — E43 Unspecified severe protein-calorie malnutrition: Secondary | ICD-10-CM

## 2014-07-12 DIAGNOSIS — I482 Chronic atrial fibrillation, unspecified: Secondary | ICD-10-CM

## 2014-07-12 DIAGNOSIS — E876 Hypokalemia: Secondary | ICD-10-CM | POA: Diagnosis not present

## 2014-07-12 DIAGNOSIS — I639 Cerebral infarction, unspecified: Secondary | ICD-10-CM | POA: Diagnosis not present

## 2014-07-12 DIAGNOSIS — S7291XS Unspecified fracture of right femur, sequela: Secondary | ICD-10-CM

## 2014-07-12 DIAGNOSIS — Z7189 Other specified counseling: Secondary | ICD-10-CM

## 2014-07-12 NOTE — Progress Notes (Signed)
Patient ID: Pamela [name], female   DOB: [birth year], 81 y.o.   MRN: [id number]   Place of Service: [facility]  Allergies  Allergen Reactions  . Statins Other (See Comments)    unknown    Code Status: DNR  Goals of Care: Comfort and Quality of Life/Palliative   Chief Complaint  Patient presents with  . Medical Management of Chronic Issues    FTT, PCM, chornic afib, GERD, righ femur fx, dysphagia    HPI 81 y.o. female with PMH of HTN, HLD, fibromyalgia, paroxysmal afib, PAD, CVA,  right distal femoral diaphysis fracture s/p ORIF on 04/30/14, FTT, GI bleed among others is being seen for a routine visit for management of her chronic issues. Weight stable over the past 30 days. No new skin concerns reported: pressure ulcers of bil heels, right foot, right calf, and sacral area either stable or improving. No change in behavior or functional status reported. No concerns from nursing staff. Still keep right knee in flexion. Unable to fully participate in hpi or ros due to dementia, but denies any pain or discomfort.     Past Medical History  Diagnosis Date  . Hyperlipidemia   . Hypertension   . Fibromyalgia   . Thyroid disease     hyperparathyroid  . PAD (peripheral artery disease)   . TIA (transient ischemic attack) ~ 2009    "only 1 that I know of" (12/13/2011)  . Atrial fibrillation     Not on Coumadin. Noted during 02/2009 admission.   . Skin cancer     Unknown types  . Schatzki's ring     Non obstructing ring on EGD 02/2009   . Pancreatic mass 02/2009    Abnormal CT abdomen, with cystic area in head and circumferential thickening of midbody of stomach  . Fibula fracture 12/11/2011    left  . Stroke 2006    "drags right leg very slightly" (12/13/2011)  . GERD (gastroesophageal reflux disease)   . H/O hiatal hernia   . Hard of hearing     "totally deaf in the right; 50% is gone in the left" (12/13/2011)    Past Surgical History  Procedure Laterality Date  .  Appendectomy  1940  . Parathyroidectomy  1980  . Vaginal hysterectomy  1970    History  Substance Use Topics  . Smoking status: Never Smoker   . Smokeless tobacco: Never Used  . Alcohol Use: No    Family History  Problem Relation Age of Onset  . Cancer Other     breast ca  . Cancer      mouth ca       Medication List       This list is accurate as of: 07/12/14  4:48 PM.  Always use your most recent med list.               acetaminophen 500 MG tablet  Commonly known as:  TYLENOL  Take 650 mg by mouth 2 (two) times daily.     metoprolol succinate 25 MG 24 hr tablet  Commonly known as:  TOPROL-XL  Take 25 mg by mouth daily.     OXYGEN  Inhale 3 L into the lungs continuous.     ranitidine 150 MG tablet  Commonly known as:  ZANTAC  Take 150 mg by mouth at bedtime.     traMADol 50 MG tablet  Commonly known as:  ULTRAM  Take 25 mg by mouth every 12 (twelve) hours  as needed for moderate pain.        Physical Exam  BP 127/78 mmHg  Pulse 90  Temp(Src) 97.7 F (36.5 C)  Resp 18  Ht 5\' 4"  (1.626 m)  Wt 115 lb (52.164 kg)  BMI 19.73 kg/m2  SpO2 96%  Constitutional: very frail elderly female in no acute distress. Converse some with confusions. HEENT: Normocephalic and atraumatic. PERRL. EOM intact. No scleral icterus. Hearing impairment Neck: No JVD or carotid bruits. Cardiac: Normal S1, S2. RRR without appreciable murmurs, rubs, or gallops. Distal pulses intact. Trace dependent edema.  Lungs: No respiratory distress. Breath sounds clear bilaterally without rales, rhonchi, or wheezes. Oxygen in place at 3lpm via [place] Abdomen: Audible bowel sounds in all quadrants. Soft, nontender, nondistended. Musculoskeletal: Right knee frozen in flexion about 120 degrees. Right nterior knee joint tender to palpation. No signs of injuries noted.  Skin: Warm and dry. Dressing in place for pressure ulcers Psychiatric: appropriate mood and affect for situation   Labs Reviewed    CBC Latest Ref Rng 06/10/2014 06/09/2014 06/09/2014  WBC 4.0 - 10.5 K/uL 9.3 11.1(H) 14.4(H)  Hemoglobin 12.0 - 15.0 g/dL 10.1(L) 11.1(L) 11.0(L)  Hematocrit 36.0 - 46.0 % 33.8(L) 37.7 37.4  Platelets 150 - 400 K/uL 195 235 245    CMP Latest Ref Rng 06/10/2014 06/09/2014 06/08/2014  Glucose 70 - 99 mg/dL 101(H) 127(H) 132(H)  BUN 6 - 23 mg/dL 27(H) 28(H) 41(H)  Creatinine 0.50 - 1.10 mg/dL 0.73 0.64 0.72  Sodium 135 - 145 mmol/L 146(H) 146(H) 154(H)  Potassium 3.5 - 5.1 mmol/L 3.4(L) 3.2(L) 3.4(L)  Chloride 96 - 112 mmol/L 107 110 109  CO2 19 - 32 mmol/L 31 30 36(H)  Calcium 8.4 - 10.5 mg/dL 8.5 8.3(L) 9.0  Total Protein 6.0 - 8.3 g/dL - 5.4(L) 6.5  Total Bilirubin 0.3 - 1.2 mg/dL - 0.7 0.5  Alkaline Phos 39 - 117 U/L - 97 123(H)  AST 0 - 37 U/L - 42(H) 49(H)  ALT 0 - 35 U/L - 77(H) 97(H)    Assessment & Plan 1. FTT (failure to thrive) in adult Weight stable over the past 30 days. Continue current diet and dietary supplement as tolerated. Continue total assist with ADLs, and continue to monitor    2. Protein-calorie malnutrition, severe See #1  3. Dysphagia No recent aspiration. Continue pureed diet with nectar thickened liquids and aspiration precautions  4. Right knee pain Most likely OA. Continue tylenol 650mg  twice daily with tramadol 50mg  twice daily as needed. Continue to keep right knee in flexion. Leg brace was discontinued as it was causing multiple pressure ulcers of RLE. Continue restorative therapy and monitor for now.   5. Chronic a-fib Stable. Rate controlled. Continue toprol 25mg  daily and monitor.   6. Femur fracture, right, sequela S/p ORIF on 04/30/14. Continue tylenol 650mg  twice daily with tramadol 50mg  twice daily as needed for pain.   7. Stroke Stable. Monitor clinically.   8. Pressure ulcer Multiple sites. Continue wound care. Continue pressure ulcer precautions and monitor her status.   9. Hypokalemia K 3.4. Not on kcl supplement. Recheck potassium  level  10. Acute blood loss anemia R/t to recent GI bleed last hgb 10.1. Continue to monitor h&h  11. Goals of care discussion/counseling.  Pending Hospice consult  Family/Staff Communication Plan of care discussed with nursing staff.  Nursing staff verbalized understanding and agree with plan of care. No additional questions or concerns reported.    Arthur Holms, MSN, AGNP-C [facility]  [place], [place] [address] [phone] [8am-5pm] After hours: [phone]

## 2014-07-25 NOTE — Consult Note (Signed)
Brief Consult Note: Diagnosis: Right distal femur fracture.   Patient was seen by consultant.   Consult note dictated.   Recommend to proceed with surgery or procedure.   Comments: Patient seen this AM with family member at the bedside.  Patient currently has her pain controled.  Skin intact.  NVI.  Patient has a distal femur fracture.  She took her Eliquis last night and therefore surgery needs to be delayed at least 48 hours.   Plan for surgery Friday 04/30/14.  Electronic Signatures: Thornton Park (MD)  (Signed 03-Feb-16 11:23)  Authored: Brief Consult Note   Last Updated: 03-Feb-16 11:23 by Thornton Park (MD)

## 2014-07-25 NOTE — Op Note (Signed)
PATIENT NAME:  Pamela [name], LACOMB MR#:  [id number] DATE OF BIRTH:  03/08/1917  DATE OF PROCEDURE:  04/30/2014  PREOPERATIVE DIAGNOSIS: Right distal femur fracture with intercondylar extension.   POSTOPERATIVE DIAGNOSIS: Right distal femur fracture with intercondylar extension.   PROCEDURE: Open reduction internal fixation of right distal femur fracture.   SURGEON: Timoteo Gaul, MD   ANESTHESIA: Spinal.  ESTIMATED BLOOD LOSS: 50 mL.   COMPLICATIONS: None.   IMPLANTS: Zimmer NCB distal femoral plate.  REASON FOR SURGERY: The patient is a 81 year old female, who is ambulatory at baseline. She fell at her nursing facility, injuring her right leg. She was unable to bear weight following this injury and was brought to the [facility] Emergency Department. She was diagnosed with a comminuted distal femur fracture with intercondylar extension. The fracture was displaced and angulated. The family decided to proceed with surgical fixation. They understood the risks and benefits of surgery. They understand the risks include infection, bleeding, nerve or blood vessel injury, malunion, nonunion, hardware failure, persistent pain, failure to return to ambulation, and the need for further surgery. Medical risks include, but are not limited to, DVT and pulmonary embolism, myocardial infarction, stroke, pneumonia, respiratory failure, and death. The patient and patient's family understood the risks. The decision was made to proceed with surgery.   PROCEDURE NOTE: The patient was brought to the operating room where she underwent a spinal anesthetic. I had signed the right thigh, within the operative field, according to the hospital's correct site of surgery protocol. After the spinal was successfully placed, the patient was placed supine on the operative table. She was prepped and draped in a sterile fashion. A timeout was performed to verify the patient's name, date of birth, medical record number,  correct site of surgery and correct procedure to be performed. It was also used to verify the patient had received antibiotics and that all appropriate instruments, implants, and radiographic studies were available in the room. Once all in attendance were in agreement, the case began.   The patient had longitudinal traction applied to the right lower extremity. The knee was placed over a medium size triangle. FluoroScan images were taken in both the AP and lateral planes to insure adequate reduction of the fracture. A longitudinal incision was made along the distal lateral femur. The subcutaneous tissues were dissected using electrocautery, taking care to coagulate any bleeding vessels. The vastus lateralis was then identified. The fracture was palpated just below this, below the distal margin of the muscle. The smallest Zimmer NCB distal femoral plate was then placed alongside the lateral distal femur. It was K wired into position.  C-arm images were then taken in the AP and lateral planes to confirm adequate placement of the plate. The plate was then affixed to the proximal shaft of the femur through a bicortical screw. This allowed for reduction of the plate against the lateral femur.  The attention was then turned to placement of the distal screws. The fracture had been well reduced in regards to the intercondylar split. The patient had several fully threaded cancellous screws placed through the distal aspect of the plate using the polyaxial drill guide. Each screw was independently measured for depth using a depth gauge. These were then advanced into position and had excellent purchase in the bone. The screw placement was confirmed on AP and lateral C-arm images. Once the distal fracture was affixed, the attention was turned to placement of the remaining proximal bicortical screws. These were placed using the  screw and drill guide and the proximal most screws were placed percutaneously. Once all screws  were in position, final C-arm images were taken of the fracture reduction and hardware placement in both the AP and lateral planes. The wound was then copiously irrigated. The deep fascia was closed using an interrupted 0 Vicryl, the subcutaneous tissue closed with 2-0 Vicryl, and the skin approximated with staples. A dry sterile and compressive dressing was applied along with a knee immobilizer. The patient was then transferred to hospital bed and brought to the PACU in stable condition. I was scrubbed and present for the entire case, and all sharp and instrument counts were correct at the conclusion of the case.   I spoke with the patient's family in the postoperative consultation room to let them know the case had gone without complication and the patient was stable in the recovery room.   ____________________________ Timoteo Gaul, MD klk:LT D: 05/03/2014 18:16:26 ET T: 05/03/2014 18:55:03 ET JOB#: [id number]  cc: Timoteo Gaul, MD, <Dictator> Timoteo Gaul MD ELECTRONICALLY SIGNED 05/07/2014 18:53

## 2014-07-25 NOTE — Discharge Summary (Signed)
PATIENT NAME:  Pamela [name], Pamela [name] MR#:  [id number] DATE OF BIRTH:  [birth year]  DATE OF ADMISSION:  04/28/2014 DATE OF DISCHARGE:  05/07/2014   ADMITTING DIAGNOSIS: Femoral fracture.   DISCHARGE DIAGNOSES:  1. Right distal femoral diaphysis fracture, status post open reduction, internal fixation on 04/30/2014.  2. Acute subacute right parietal stroke.  3. Paroxysmal atrial fibrillation, now in sinus rhythm.  4. Essential hypertension.  5. Acute respiratory failure due to diastolic congestive heart failure and aspiration pneumonia.  6. Dysphagia.  7. Hyperlipidemia.  8. History of stroke with right hemiparesis in the past.  9. Paroxysmal afebrile.   DISCHARGE CONDITION: Stable, although guarded.   MEDICATIONS:  1. Magnesium oxide 400 mg p.o. daily. 2.  [employer] senior therapeutic multivitamins with minerals, 1 daily.  3. Senna 8.6 mg once daily at bedtime. 4. Vitamin C 500 mg p.o. daily.  5. Zetia 10 mg p.o. daily. 6. Ranitidine 150 mg p.o. twice daily. 7. Rea Lo 40% topical cream twice daily to heels.  8. Balmex 11.3% topical cream to buttocks twice daily.  9. Restasis 0.05% ophthalmic emulsion 1 drop to each affected eye twice daily.  10. Eliquis 2.5 mg twice daily.  11. Tylenol and 325 mg 2 tablets every 4 hours as needed.  12. Cozaar 25 mg p.o. daily.  13. Calcium carbonate 600 mg twice daily.  14. Toprol-XL 25 mg p.o. once daily.  15. Iron sulfate 325 mg p.o. twice daily.  16. Calcium with vitamin D 500/200 one tablet twice daily with meals.  17. Ensure 240 mg p.o. daily.  18. Tramadol 50 mg half tablet every 6 hours as needed.  19. Furosemide 40 mg p.o. daily.  20. Docusate sodium 100 mg twice daily.  21. Amoxicillin-clavulanate 400/57 mg chewable tablets 1 tablet 3 times daily for 10 days.   HOME OXYGEN: 5 liters oxygen through nasal cannula with portable tank at 5 liters continuously.   DIET: 2 grams salt, low-fat, low-cholesterol, pureed with nectar thick liquids.    ACTIVITY LIMITATIONS: As tolerated.   FOLLOWUP APPOINTMENT: With Dr. Tanja Port  in 2-3 days after discharge, Dr. Mack Guise in 1 to 2 weeks after discharge.   CONSULTANTS: Care management, social work, Dr. Mack Guise, Dr. Neoma Laming, Ms. Jasmine Pang, PA for Dr. Neoma Laming, Dr. Leotis Pain for neurology, Mr. Altha Harm, nurse practitioner for palliative care, Dr. Christophe Louis.   RADIOLOGIC STUDIES: Please refer to interim discharge summary dictated by Dr. Vianne Bulls on 05/05/2014.   RADIOLOGIC STUDIES: After 05/05/2014 and then chest x-ray, portable ordered today on 05/07/2014, however, pending at the time of this dictation.   HISTORY OF PRESENT ILLNESS: The patient is a 81 year old Caucasian female with history of stroke in the past with right-sided weakness, who presents to the hospital on 04/28/2014 after fall. Please refer to Dr. Samantha Crimes admission note dictated on 04/28/2014.   On arrival to the hospital, the patient patient's vital signs: Temperature was 98, pulse was 85, respiration rate was 20, blood pressure 155/63, saturation was 100% on supplemental oxygen therapy.   Physical exam revealed the patient has difficulty following commands, making neuro exam somewhat difficult; however, her cranial nerves were grossly intact and no gross focal neurologic deficits were noted. The patient did have ecchymosis of the right knee and dorsum of right foot.   X-rays revealed a comminuted fracture involving the distal femoral diaphysis with mild impaction, posterior displacement, as well as significant posterior angulation. The patient's CT of head, as well as CT of C-spine, showed no  significant changes. The patient was seen by Dr. Mack Guise the same day, 04/28/2014, and felt that the patient is moderate to high risk for surgery. He felt that patient should be off of Eliquis prior to surgery. Therefore, surgery was delayed until  Friday, 04/30/2014. The patient was taken to the Operating  Room on Friday, 04/30/2014 and underwent open reduction internal fixation of right distal femoral fracture under spinal anesthesia. Postoperatively, the patient was confused. She was found to have a new stroke. CT scan of head with no contrast done on 05/03/2014 showed subacute to acute cortical infarct in the high posterior parietal area on the right side, but no intracranial bleeding was noted. The patient was seen by neurologist, Dr. Irish Elders on 05/03/2014 and it was felt that the patient should return back when the Eliquis is allowed by orthopedic surgery. The patient was restarted on Eliquis and did relatively well, although postoperatively, she was noted to have very poor oral intake. She had an echocardiogram as workup of her stroke 05/03/2014, which showed left ventricular ejection fraction by visual estimation from 60 to 65%, normal global left ventricular systolic function, normal right ventricular size, as well as systolic function, mildly dilated left atrium, mild to moderate mitral valve regurgitation, mild to moderate aortic valve sclerosis and calcification without any evidence of aortic stenosis, moderate tricuspid regurgitation, mildly elevated pulmonary arterial systolic pressure. The patient underwent cardiology consult by Dr. Neoma Laming. The patient was CHADS 2 score of 4.  He recommended to continue Eliquis 2.5 mg twice daily dose. As mentioned above, the patient was restarted on Eliquis, but remained in sinus rhythm. Postoperatively, the patient was also noted to be severely hypoxic with low oxygen saturations requiring 5 liters of oxygen through nasal cannula. Her chest x-ray done on 05/04/2014, revealed a small bilateral pleural effusion, right greater than left, with bilateral interstitial, as well as alveolar airspace opacities. Differential diagnosis includes multilobar pneumonia versus pulmonary edema. The patient was initiated on Zosyn due to concerns of pneumonitis, as well as  diuretics and Lasix. She diuresed well, however, her O2 requirements remained at 3 liters per [place]. Patient will be seen by speech therapist today, on 05/07/2014. Her diet should be adjusted.  On the day of discharge, however, the patient felt satisfactory, did not complain of any significant pain. Her vitals were stable. Temperature 97.5, pulse was 75-90, respiration was 18, blood pressure 141/62, saturation was 98% on 5 liters of oxygen through nasal cannula. The patient's oral intake remains very poor despite encouragement. The patient would benefit from palliative care.   FOLLOWUP? As outpatient.  ____________________________ Theodoro Grist, MD rv:ap D: 05/07/2014 08:23:59 ET T: 05/07/2014 09:03:56 ET JOB#: [id number]  cc: Theodoro Grist, MD, <Dictator> Timoteo Gaul, MD Dr. Blima Rich MD ELECTRONICALLY SIGNED 05/29/2014 12:47

## 2014-07-25 NOTE — Consult Note (Signed)
PATIENT NAME:  Pamela [name], Pamela [name] MR#:  [id number] DATE OF BIRTH:  09/18/1916  DATE OF CONSULTATION:  05/04/2014  REFERRING PHYSICIAN:   CONSULTING PHYSICIAN:  Kelby Fam. Dorena Cookey, PA-C  INDICATION FOR CONSULT: Atrial fibrillation, discuss anticoagulation concerns with family.   HISTORY OF PRESENT ILLNESS: This is a 81 year old Caucasian female who presented to the Emergency Room on February 3 complaining of a mechanical fall. She has past medical history of paroxysmal atrial fibrillation, with concurrent CVA in 2003 with residual left deficits. She is status post successful ORIF of right distal femur on February 5.  Cardiology consulted to discuss anticoagulation concerns with family members.   REVIEW OF SYSTEMS: Unattainable secondary to nonresponsiveness.   PAST MEDICAL HISTORY: Paroxysmal atrial fibrillation, CVA with residual left-sided weakness.   SOCIAL HISTORY: No EtOH, tobacco, or other illicit drug use.   HOME MEDICATIONS:  Tylenol 500 mg p.o. b.i.d. p.r.n. pain, losartan 100 mg daily, calcium carbonate 600 mg p.o. daily, Eliquis 2.5 mg p.o. b.i.d., Zetia 10 mg p.o. daily, Lopressor 25 mg daily, Norvasc 5 mg daily, ranitidine 150 mg p.o. b.i.d., magnesium oxide 400 mg p.o. daily.   FAMILY HISTORY: Negative for cardiovascular disease.    ALLERGIES: STATINS.   PHYSICAL EXAMINATION:  VITAL SIGNS: Temperature 98.3, pulse 77, respirations 19, blood pressure 130/69, pulse oximetry 88% saturation on 5 liters nasal cannula oxygen.  GENERAL: The patient is lying in bed, easily arousable, but nonresponsive.  HEENT: No carotid bruit or JVD.  CARDIOVASCULAR: Normal S1, S2. No audible murmur.  PULMONARY: Good air entry bilaterally. No wheezes, rhonchi or rales.  GASTROINTESTINAL: Soft, nontender. Positive bowel sounds.  EXTREMITIES: Dressing on right leg. No pedal edema appreciated.   LABORATORY DATA: Creatinine 0.89 INR 1.1. EKG shows normal sinus rhythm, 84 beats per minute, left atrial  enlargement. Echo shows ejection fraction of 60-65%, mild to moderate MR, moderate TR.   ASSESSMENT AND PLAN:   Paroxysmal atrial fibrillation, with past medical history of cerebrovascular accident. The patient is CHADS 3-4, thus we do recommend that Eliquis 2.5 mg b.i.d. be continued. Spoke with patient's POA, Marcie Bal, at length this afternoon and discussed risk and benefit of Eliquis in regards to further risk of CVA versus risk of intracranial bleeding. She is agreeable, will stop Lovenox, we will continue to follow.   Thank you very much for this consult.      ____________________________ Kelby Fam. [place] ear:bu D: 05/04/2014 15:37:36 ET T: 05/04/2014 15:45:07 ET JOB#: [id number]  cc: Dyann Ruddle A. Baldwin [place], <Dictator> Kelby Fam Brandt Chaney PA ELECTRONICALLY SIGNED 05/13/2014 16:21

## 2014-07-25 NOTE — Consult Note (Signed)
PATIENT NAME:  Pamela [name], Pamela [name] MR#:  [id number] DATE OF BIRTH:  01/04/1917  DATE OF CONSULTATION:  04/28/2014  REFERRING PHYSICIAN:   CONSULTING PHYSICIAN:  Timoteo Gaul, MD  REASON FOR CONSULTATION: Right distal femur fracture.   HISTORY OF PRESENT ILLNESS: Pamela [name] is a 81 year old female who sustained a mechanical fall prior to admission. The patient was unable to stand or bear weight following this injury. She was brought to the [facility] Emergency Department, where she was diagnosed with a comminuted and displaced fracture of the right distal femur.   The patient was admitted to the hospitalist service and orthopedics was consulted for management of the right distal femur fracture. The patient is not an accurate historian, but the family explains that that the patient does stand and ambulate short distances at baseline.   PAST MEDICAL HISTORY: Includes paroxysmal atrial fibrillation, previous CVA with residual left-sided weakness.   ALLERGIES: STATINS:  HOME MEDICATIONS: Includes Tylenol 500 mg p.o. b.i.d. as needed for pain, losartan 100 mg p.o. b.i.d., calcium carbonate 600 mg p.o. daily, Eliquis 2.5 mg p.o. b.i.d., Zetia 10 mg p.o. daily, Lopressor 25 mg p.o. daily, Norvasc 5 mg p.o. daily, Balmex 11.3% topical cream applied to the buttocks b.i.d., ranitidine 150 mg p.o. b.i.d., senna 8.6 mg p.o. at bedtime, magnesium oxide 400 mg p.o. daily, Restasis 0.05% ophthalmic solution 1 drop to each eye twice daily, multivitamin daily, and vitamin C 500 mg p.o. daily.   SOCIAL HISTORY: The patient denies alcohol, tobacco, or drug use. She lives at the [employer] of [place] and uses a wheelchair primarily for transportation.   PHYSICAL EXAMINATION: The patient's right leg has skin intact. There is swelling at the distal end of the right femur, but the thigh compartments are soft and compressible. She has a hemarthrosis of the right knee, but there is no erythema or ecchymosis. Her  leg compartments are soft and compressible as well. The patient has pain with any movement to the right knee. She has no calf tenderness. She has palpable pedal pulses, intact sensation to light touch, and can flex and extend her toes. There is no angular deformity, but she has her knee flexed with a pillow underneath the knee for comfort.   RADIOLOGY DATA : X-ray films of the hip, knee, and ankle were reviewed from the ER. The major finding that was on these films was that there was no evidence of hip fracture. The patient has a comminuted fracture of the distal femur with intercondylar extension. The fracture is in a flexed position with the condyles angulated posteriorly. There was an apex anterior position to this fracture.   ASSESSMENT: Right comminuted distal femur fracture.   PLAN: The treatment options for this patient include nonoperative management, which will lead to a malunion versus operative fixation. Given the patient's age, she is deemed a moderate to high risk for surgery by the hospitalist service. The patient is cleared to proceed with surgery. She is on Eliquis and will need to be off this medication for a minimum of 48 hours prior to surgery. Therefore, her surgery will be delayed until Friday, 04/30/2014. I have reviewed the x-ray films in preparation for this case. I am ordering a CAT scan for further surgical planning. The family will need to decide whether they want to proceed with operative versus nonoperative treatment.    ____________________________ Timoteo Gaul, MD klk:mw D: 04/29/2014 12:53:55 ET T: 04/29/2014 13:04:00 ET JOB#: [id number]  cc: Timoteo Gaul, MD, <  Dictator> Timoteo Gaul MD ELECTRONICALLY SIGNED 05/03/2014 18:24

## 2014-07-25 NOTE — Consult Note (Signed)
PATIENT NAME:  Pamela [name], Pamela [name] MR#:  [id number] DATE OF BIRTH:  12/26/1916  DATE OF CONSULTATION:  05/03/2014  REFERRING PHYSICIAN:   CONSULTING PHYSICIAN:  Leotis Pain, MD  REASON FOR CONSULTATION: Possible stroke.   HISTORY OF PRESENT ILLNESS: This is a 81 year old Caucasian female with a history of atrial fibrillation and history of stroke with residual left-sided weakness, who presented with pain in the right lower extremity. The patient was found to have a right femur fracture, status post  open reduction and internal fixation. Post procedure, the patient has been sleepy and difficult to arouse. The patient is status post a CAT scan that showed some parietal subacute and acute infarcts, all on the right parietal side. At baseline, the patient was on Eliquis; currently off Eliquis due to her procedure.    REVIEW OF SYSTEMS: Unable to obtain.   IMAGING: As described above.   ALLERGIES: STATIN.  SOCIAL HISTORY: No alcohol, tobacco or drug use.   PAST MEDICAL HISTORY: Atrial fibrillation.   NEUROLOGIC: The patient is awake, but does not follow commands. Falls asleep easily. There is definite weakness in the left upper extremity, which appears to be chronic and weaker on the right lower extremity, likely due to pain, but difficult to arouse without painful stimulation.   IMPRESSION: A 81 year old female with chronic atrial fibrillation, stroke, status post right femur fracture open reduction and internal fixation, and CT head showing subacute and acute right parietal strokes.   PLAN: looking at the CAT scan, the strokes are small. From my perspective and neurological perspective, the patient can start Eliquis whenever agreed upon by orthopedic surgery. Physical therapy, occupational therapy when possible. I have discussed this case with the patient's niece, who was at bedside.   Thank you. It was a pleasure seeing this patient.    ____________________________ Leotis Pain,  MD yz:MT D: 05/03/2014 16:15:34 ET T: 05/03/2014 16:45:06 ET JOB#: [id number]  cc: Leotis Pain, MD, <Dictator> Leotis Pain MD ELECTRONICALLY SIGNED 05/04/2014 12:12

## 2014-07-25 NOTE — H&P (Signed)
PATIENT NAME:  Pamela [name], Pamela [name] MR#:  [id number] DATE OF BIRTH:  [birth year]  DATE OF ADMISSION:  04/28/2014  REFERRING PHYSICIAN: Loney Hering, MD    CHIEF COMPLAINT: Leg pain.    HISTORY OF PRESENT ILLNESS: A 81 year old Caucasian female with history of atrial fibrillation paroxysmal as well as CVA with residual left-sided weakness presented with right-sided leg pain. The patient unable to provide much information. She is a fairly poor historian, however, history obtained from daughter who is present at bedside. States the patient had mechanical fall today while attempting to self transfer from bed to wheelchair. Unfortunately suffered a fall without head trauma or loss of consciousness and had immediate pain of her right leg located just above the knee region. Rating the pain 8 to 9 out of 10 intensity, quality only as "pain." nonradiating, worse with movements, no relieving factors.   REVIEW OF SYSTEMS: Unobtainable given patient's mental status and medical condition.   PAST MEDICAL HISTORY: Atrial fibrillation, paroxysmal, as well as CVA with residual left-sided weakness.   SOCIAL HISTORY: No alcohol, tobacco, or drug use. She currently resides at [facility], uses a wheelchair for ambulation.   FAMILY HISTORY: No known cardiovascular or pulmonary disorders.   ALLERGIES: STATINS.   HOME MEDICATIONS: Include Tylenol 500 mg p.o. b.i.d. as needed for pain, losartan 100 mg p.o. daily, calcium carbonate 600 mg p.o. daily, Eliquis 2.5 mg p.o. b.i.d., Zetia 10 mg p.o. daily, Lopressor 25 mg p.o. daily, Norvasc 5 mg p.o. daily, Balmex 11.3% topical cream apply to buttocks twice daily, ranitidine 150 mg p.o. b.i.d., senna 8.6 mg p.o. at bedtime, magnesium oxide 400 mg p.o. daily, Restasis 0.05% ophthalmic  1 drop to each eye twice daily, multivitamin 1 tab p.o. daily, vitamin C 500 mg p.o. daily.   PHYSICAL EXAMINATION:  VITAL SIGNS: Temperature 98, heart rate 85, respirations 20,  blood pressure 155/63, saturating 96% on supplemental O2, weight 63.5 kg, BMI 23.3.  GENERAL: Chronically weak, frail-appearing Caucasian female currently in minimal distress given leg pain.  HEAD: Normocephalic, atraumatic.  EYES: Pupils equal, round, reactive to light. Extraocular muscles intact. No scleral icterus.  MOUTH: Moist mucosal membrane. Dentition intact. No abscess noted.  EAR, NOSE, THROAT: Clear without exudates. No external lesions.  NECK: Supple. No thyromegaly. No nodules. No JVD.  PULMONARY: Clear to auscultation bilaterally without wheezes, rales or rhonchi. No use of accessory muscles. Good respiratory effort.  CHEST: Nontender to palpation.  CARDIOVASCULAR: S1, S2, regular rate and rhythm. No murmurs, rubs or gallops. No edema. Pedal pulses 2+ bilaterally. GASTROINTESTINAL: Soft, nontender, nondistended. No masses. Positive bowel sounds. No hepatosplenomegaly.  MUSCULOSKELETAL: No swelling, clubbing. There is a gross deformity of the right knee which limits range of motion, however, distal range of motion is intact.   NEUROLOGIC: The patient has some difficulty following commands making neuro exam somewhat difficult, however, cranial nerves II through XII intact. No gross focal neurological deficits. Sensation intact. Reflexes intact.  SKIN: An area of ecchymosis over the right knee as well as the dorsum of the right foot.  Otherwise, no further lesions, rashes, or cyanosis. Skin warm and dry. Turgor intact.  PSYCHIATRIC: Mood, affect flat and she is awake, alert and oriented to person. Insight, judgment seem poor.   LABORATORY DATA: CT head performed which reveals no acute intracranial process. CT C spine performed reveals no acute findings. X-ray of the right ankle reveals no definitive evidence of fracture-dislocation. There is minimal lucency at the base of fifth  metatarsal thought to reflect overlying soft tissues. X-ray of the right hip reveals mild cortical irregularity  on the right femoral head and neck; however, no definitive evidence of acute fracture or dislocation. X-ray of the right knee reveals comminuted fracture involving the distal femoral diaphysis with mild impaction, posterior displacement, and significant posterior angulation.   Remainder of laboratory data: Sodium 141, potassium 4, chloride 103, bicarbonate of 30, BUN 20, creatinine 0.95, glucose of 132. WBC 9.3, hemoglobin 13.9, platelets of 245. Urinalysis: No evidence of infection.   ASSESSMENT AND PLAN: A 81 year old Caucasian female with history of atrial fibrillation, paroxysmal, cerebrovascular accident  with residual left-sided weakness presenting after mechanical fall.  1.  Preoperative evaluation of right distal femur fracture. Consult orthopedics. Provide pain medications as required. Initiate bowel regimen. She should be considered a moderate risk for moderate risk surgery. METs less than 4 given generalized dismobility.  However, she has no active cardiovascular symptoms including CHF, valvular disease, active arrhythmias or evidence of congestive heart failure. As far as her medications are concerned, we will hold ACE inhibitor perioperatively, can restart postoperative to decrease the risk of intraoperative hypotension. The patient is also on Eliquis. She should ideally miss 3 to  4 doses prior to this surgery. Her last dose was  04/27/2014 in the evening. Should delay surgery until 04/29/2014 p.m. or 04/30/2014.  2.  Atrial fibrillation, paroxysmal, currently in normal sinus rhythm, on anticoagulation as above, holding Eliquis until the 5th and restarting 24 hours after.   3.  Hypertension, essential. Continue with Norvasc, hold ACE inhibitor as above, continue with Lopressor.  4.  Hyperlipidemia, Zetia.  5.  Venous thromboembolic prophylaxis  with sequential compression devices.   CODE STATUS:  The patient is full code.    TIME SPENT: 45 minutes.   ____________________________ Aaron Mose. Qianna Clagett, MD dkh:AT D: 04/28/2014 02:31:22 ET T: 04/28/2014 03:07:30 ET JOB#: [id number]  cc: Aaron Mose. English [place], MD, <Dictator> Braiden Rodman Woodfin Ganja MD ELECTRONICALLY SIGNED 04/28/2014 20:40

## 2014-07-25 NOTE — Discharge Summary (Signed)
PATIENT NAME:  Pamela [name], Pamela [name] MR#:  [id number] DATE OF BIRTH:  [birth year]  DATE OF ADMISSION:  04/28/2014 DATE OF DISCHARGE:  05/07/2014  ADDENDUM   The patient's chest x-ray was repeated and was found to have very similar appearance. According to radiologist, stable bilateral lung opacities, right greater than left, with minimal associated pleural effusions. The patient was advised to continue antibiotic therapy. In regards to swallowing abilities, the patient was seen by speech therapist and recommended dysphagia I and nectar thick liquids. The patient was advised to continue Ensure Plus 3 times a day. Medications should be given in puree, crushed as able, assistance with meals, ensure that the patient is sitting upright, supported forward, and no straws should be given. Strict aspiration precautions including small single bites and sips, eat and drink food slowly, no straws, reduce distractions and talking during meals or oral intake. Encourage the patient to clear her mouth completely using a followup swallow if necessary. Activity limitations as tolerated. The patient is referred to speech therapy as outpatient in facility.  ____________________________ Theodoro Grist, MD rv:sb D: 05/07/2014 11:00:20 ET T: 05/07/2014 11:34:27 ET JOB#: [id number]  cc: Theodoro Grist, MD, <Dictator> Griffin Dewilde MD ELECTRONICALLY SIGNED 05/12/2014 12:24

## 2014-08-09 ENCOUNTER — Non-Acute Institutional Stay (SKILLED_NURSING_FACILITY): Payer: Medicare Other | Admitting: Registered Nurse

## 2014-08-09 DIAGNOSIS — R0682 Tachypnea, not elsewhere classified: Secondary | ICD-10-CM

## 2014-08-09 DIAGNOSIS — Z515 Encounter for palliative care: Secondary | ICD-10-CM | POA: Diagnosis not present

## 2014-08-25 NOTE — Progress Notes (Signed)
Patient ID: Pamela [name], female   DOB: 05/21/1916, 81 y.o.   MRN: [id number]   Place of Service: [facility]  Allergies  Allergen Reactions  . Statins Other (See Comments)    unknown    Code Status: DNR  Goals of Care: Comfort and Quality of Life/Palliative   Chief Complaint  Patient presents with  . Acute Visit    tachypnea     HPI 81 y.o. female with PMH of HTN, HLD, fibromyalgia, paroxysmal afib, PAD, CVA,  right distal femoral diaphysis fracture s/p ORIF on 04/30/14, FTT, GI bleed among others is being seen for an acute visit for the evaluation of tachypnea. Nursing staff believes patient is actively dying. Seen in room today. Family at bedside. Unable to participate in HPI or ROS.  Review of Systems Unable to obtain due to dementia  Past Medical History  Diagnosis Date  . Hyperlipidemia   . Hypertension   . Fibromyalgia   . Thyroid disease     hyperparathyroid  . PAD (peripheral artery disease)   . TIA (transient ischemic attack) ~ 2009    "only 1 that I know of" (12/13/2011)  . Atrial fibrillation     Not on Coumadin. Noted during 02/2009 admission.   . Skin cancer     Unknown types  . Schatzki's ring     Non obstructing ring on EGD 02/2009   . Pancreatic mass 02/2009    Abnormal CT abdomen, with cystic area in head and circumferential thickening of midbody of stomach  . Fibula fracture 12/11/2011    left  . Stroke 2006    "drags right leg very slightly" (12/13/2011)  . GERD (gastroesophageal reflux disease)   . H/O hiatal hernia   . Hard of hearing     "totally deaf in the right; 50% is gone in the left" (12/13/2011)    Past Surgical History  Procedure Laterality Date  . Appendectomy  1940  . Parathyroidectomy  1980  . Vaginal hysterectomy  1970    History  Substance Use Topics  . Smoking status: Never Smoker   . Smokeless tobacco: Never Used  . Alcohol Use: No    Family History  Problem Relation Age of Onset  . Cancer Other    breast ca  . Cancer      mouth ca       Medication List       This list is accurate as of: 18-Aug-2014  2:07 PM.  Always use your most recent med list.               morphine 20 MG/ML concentrated solution  Commonly known as:  ROXANOL  Take 2.5 mg by mouth every 2 (two) hours as needed for moderate pain, severe pain, breakthrough pain, anxiety or shortness of breath.        Physical Exam  BP 123/77 mmHg  Pulse 69  Temp(Src) 97.9 F (36.6 C)  Resp 30  Constitutional: very frail elderly female in mild-moderate distress.  HEENT: Normocephalic and atraumatic.  Cardiac: Normal S1, S2. RRR without appreciable murmurs, rubs, or gallops. Lungs: Tachypenic. Breath sounds diminished without rales, rhonchi, or wheezes. Oxygen in place at 3lpm via [place] Abdomen: Audible bowel sounds in all quadrants. Skin: Warm and dry. Dressing in place for pressure ulcers Neuro: comatose  Labs Reviewed  CBC Latest Ref Rng 06/10/2014 06/09/2014 06/09/2014  WBC 4.0 - 10.5 K/uL 9.3 11.1(H) 14.4(H)  Hemoglobin 12.0 - 15.0 g/dL 10.1(L) 11.1(L) 11.0(L)  Hematocrit 36.0 - 46.0 % 33.8(L) 37.7 37.4  Platelets 150 - 400 K/uL 195 235 245    CMP Latest Ref Rng 06/10/2014 06/09/2014 06/08/2014  Glucose 70 - 99 mg/dL 101(H) 127(H) 132(H)  BUN 6 - 23 mg/dL 27(H) 28(H) 41(H)  Creatinine 0.50 - 1.10 mg/dL 0.73 0.64 0.72  Sodium 135 - 145 mmol/L 146(H) 146(H) 154(H)  Potassium 3.5 - 5.1 mmol/L 3.4(L) 3.2(L) 3.4(L)  Chloride 96 - 112 mmol/L 107 110 109  CO2 19 - 32 mmol/L 31 30 36(H)  Calcium 8.4 - 10.5 mg/dL 8.5 8.3(L) 9.0  Total Protein 6.0 - 8.3 g/dL - 5.4(L) 6.5  Total Bilirubin 0.3 - 1.2 mg/dL - 0.7 0.5  Alkaline Phos 39 - 117 U/L - 97 123(H)  AST 0 - 37 U/L - 42(H) 49(H)  ALT 0 - 35 U/L - 77(H) 97(H)    Assessment & Plan 1. End of life care Discontinue all PO meds except roxanol 2.5mg  every 2 hours as needed for shob/pain.   2. Tachypnea Roxanol 2.5mg  x1 now then every 2 hours as needed for shob/pain.     Family/Staff Communication Plan of care discussed with family and nursing staff.  Family and nursing staff verbalized understanding and agree with plan of care. No additional questions or concerns reported.    Arthur Holms, MSN, AGNP-C [facility] [address] [place], [place] [address] [phone] [8am-5pm] After hours: [phone]

## 2014-08-25 DEATH — deceased

## 2016-12-16 IMAGING — CR RIGHT ANKLE - COMPLETE 3+ VIEW
1 series · 3 of 3 positions shown · non-contrast
Comparison: None.

CLINICAL DATA: Status post unwitnessed fall. Right knee and ankle
pain, with swelling and bruising at the right ankle. Initial
encounter.

EXAM:
RIGHT ANKLE - COMPLETE 3+ VIEW

[Series 1: x ankle ap right · 0.14mm/px · 3 of 3 slices shown]
[im 1/3]
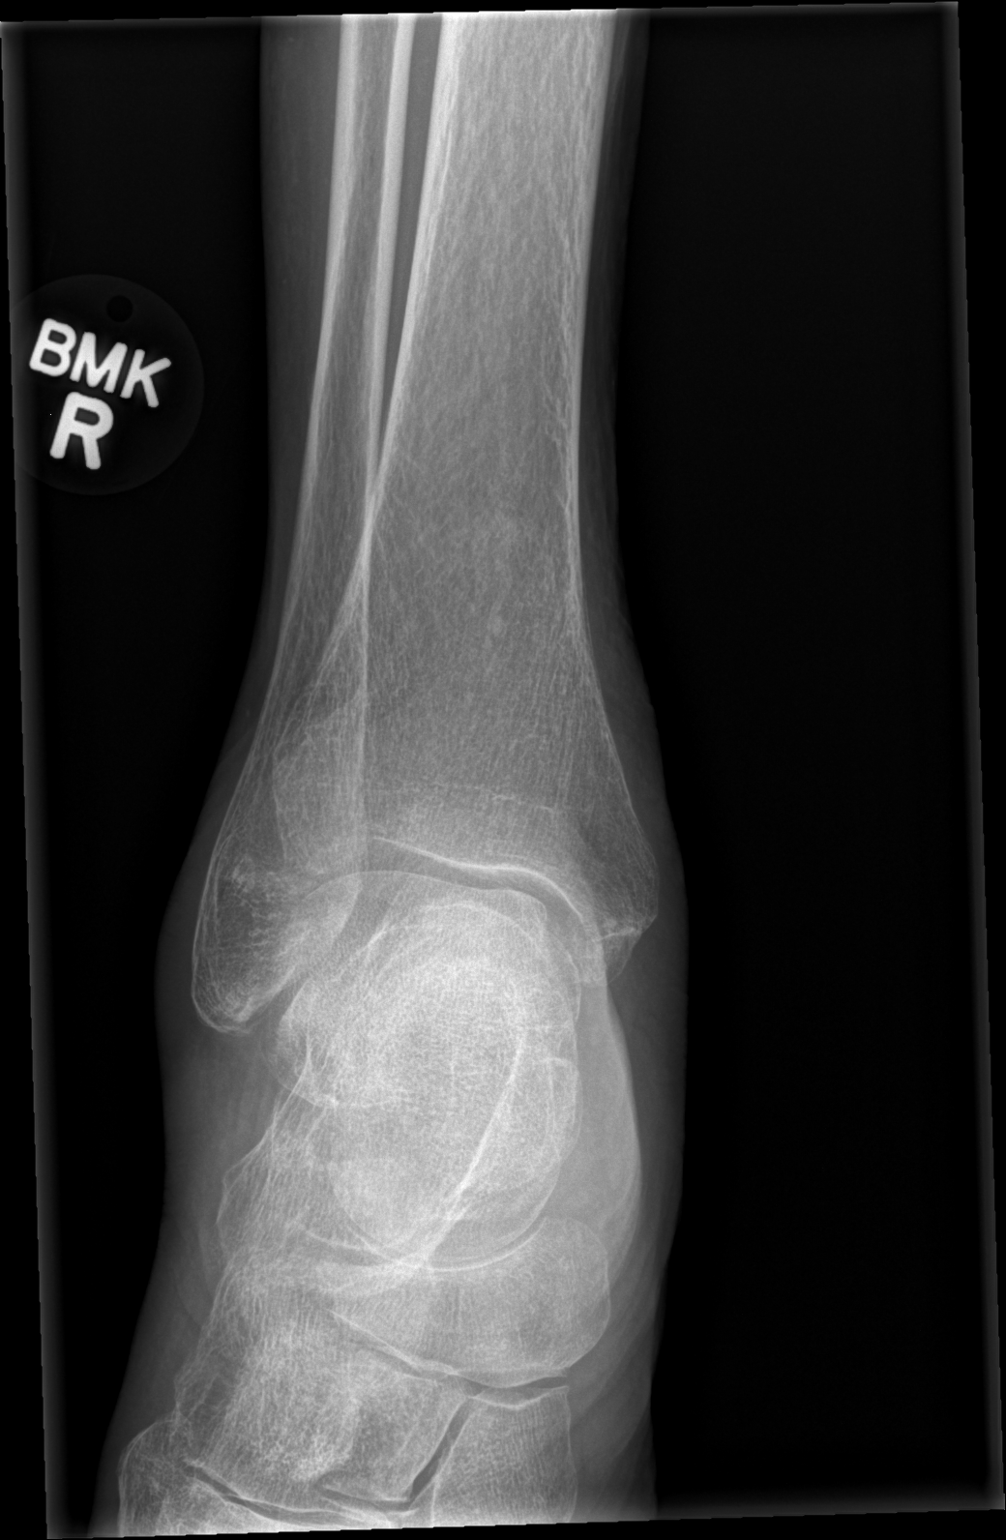
[im 2/3]
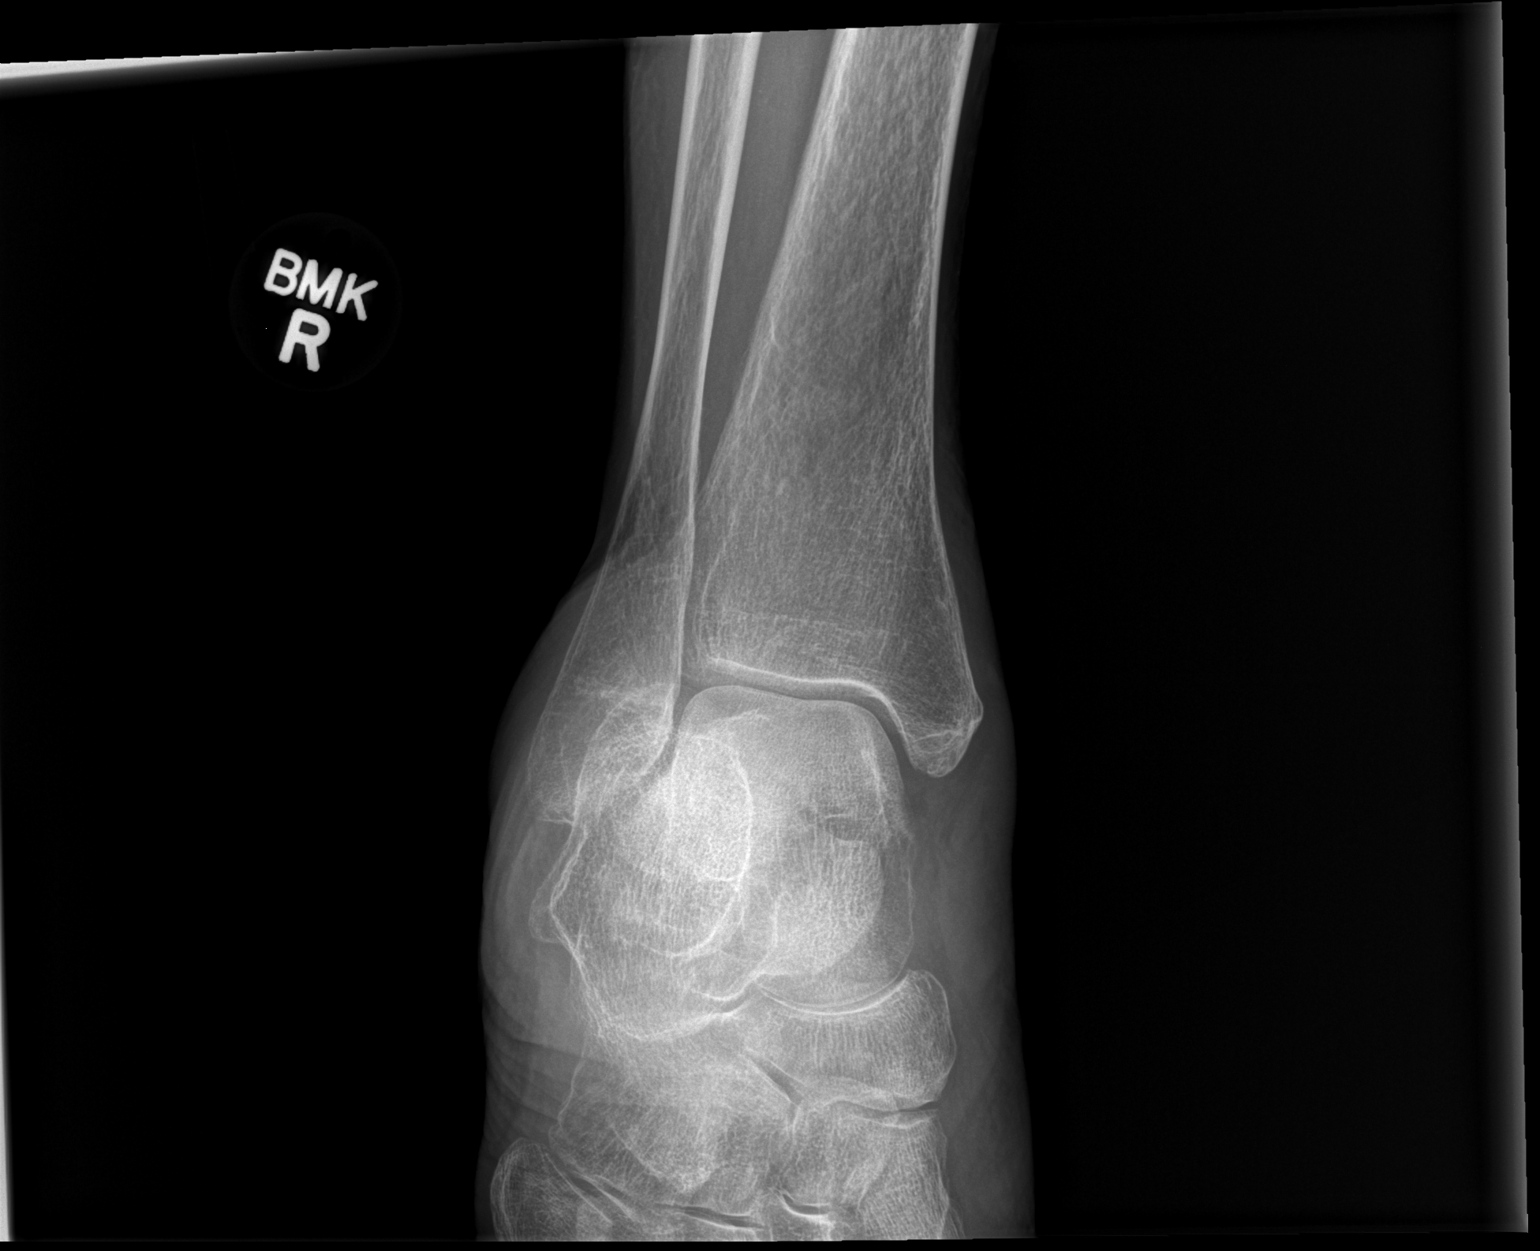
[im 3/3]
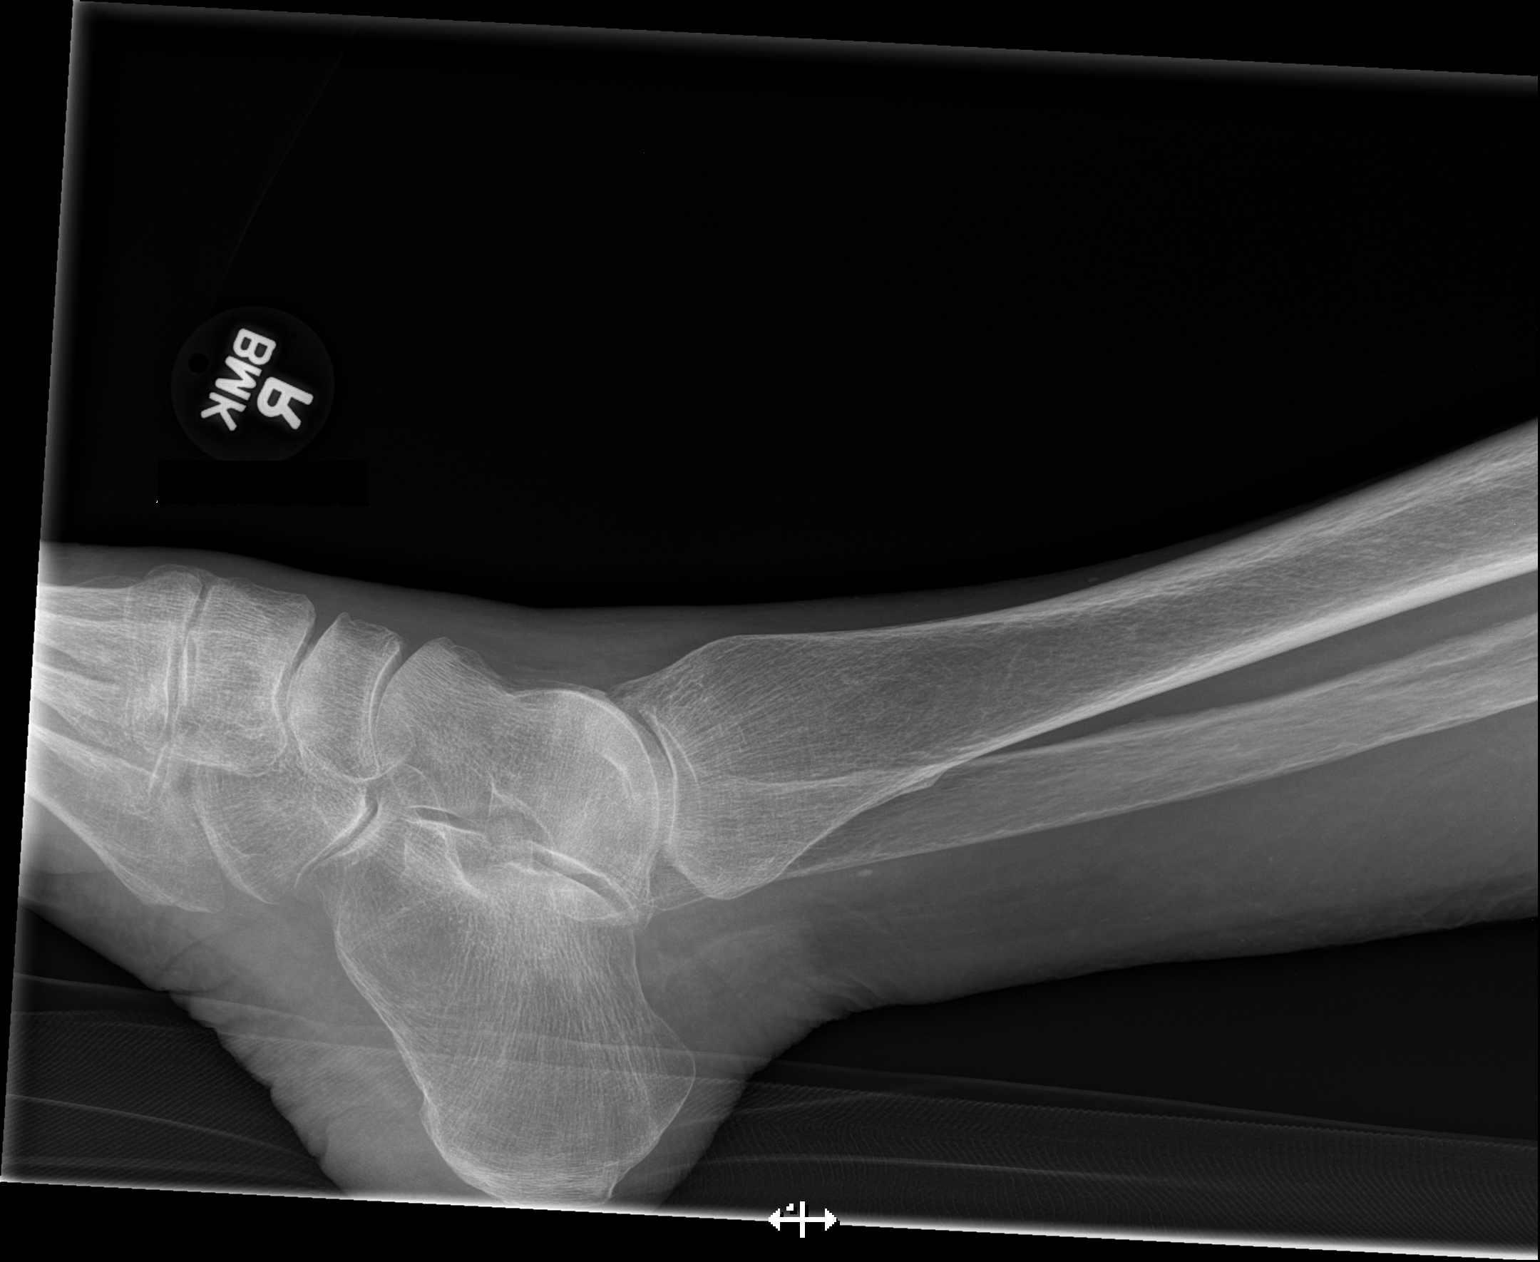

[3 of 3 positions shown; findings below may reference images not displayed]

FINDINGS: There is no evidence of fracture or dislocation. Minimal lucency at
the base of the fifth metatarsal is thought to reflect overlying
soft tissues, though would correlate for associated symptoms. The
ankle mortise is intact; the interosseous space is within normal
limits. No talar tilt or subluxation is seen.

The joint spaces are preserved. No significant soft tissue
abnormalities are seen.
IMPRESSION: 1. No definite evidence of fracture or dislocation.
2. Minimal lucency at the base of fifth metatarsal is thought to
reflect overlying soft tissues, though would correlate for
associated symptoms, to help exclude a small avulsion fracture at
the base of the fifth metatarsal.

## 2016-12-16 IMAGING — CR RIGHT HIP - COMPLETE 2+ VIEW
1 series · 3 of 3 positions shown · non-contrast
Comparison: None.

CLINICAL DATA: Status post fall; right hip pain. Initial encounter.

EXAM:
RIGHT HIP (WITH PELVIS) 2-3 VIEWS

[Series 1: t hip ap right · 0.14mm/px · 3 of 3 slices shown]
[im 1/3]
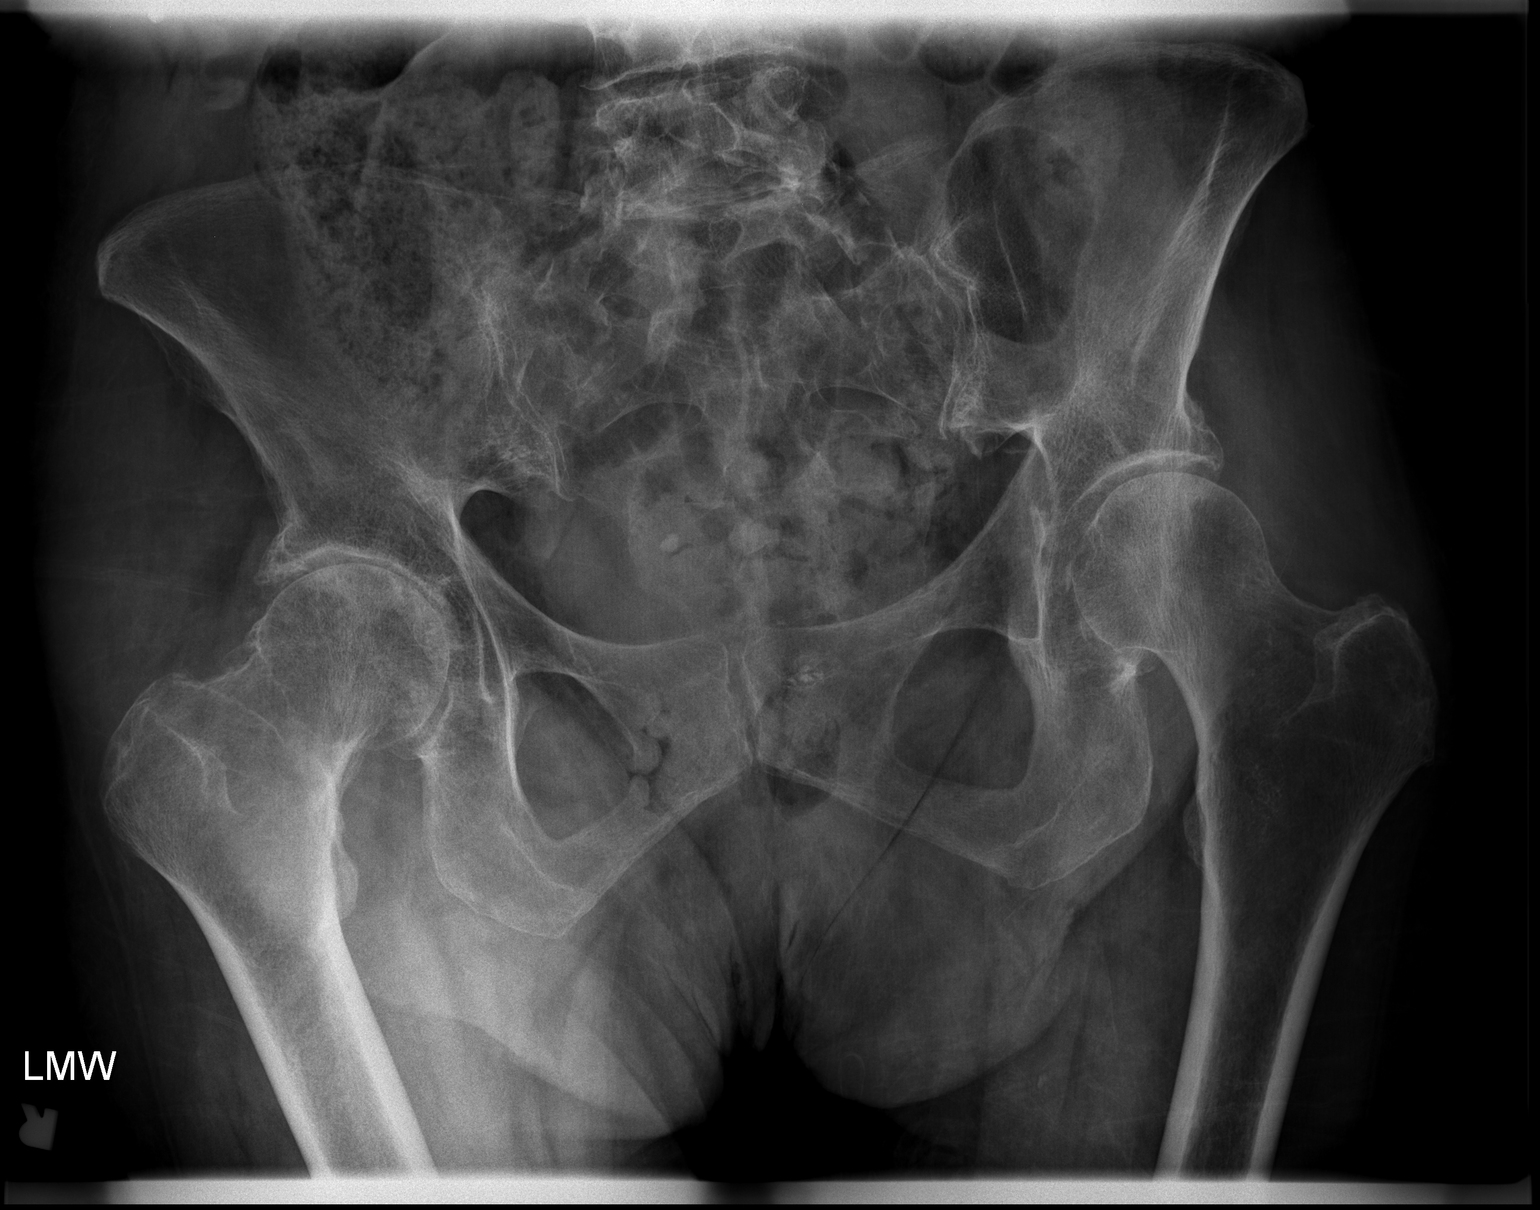
[im 2/3]
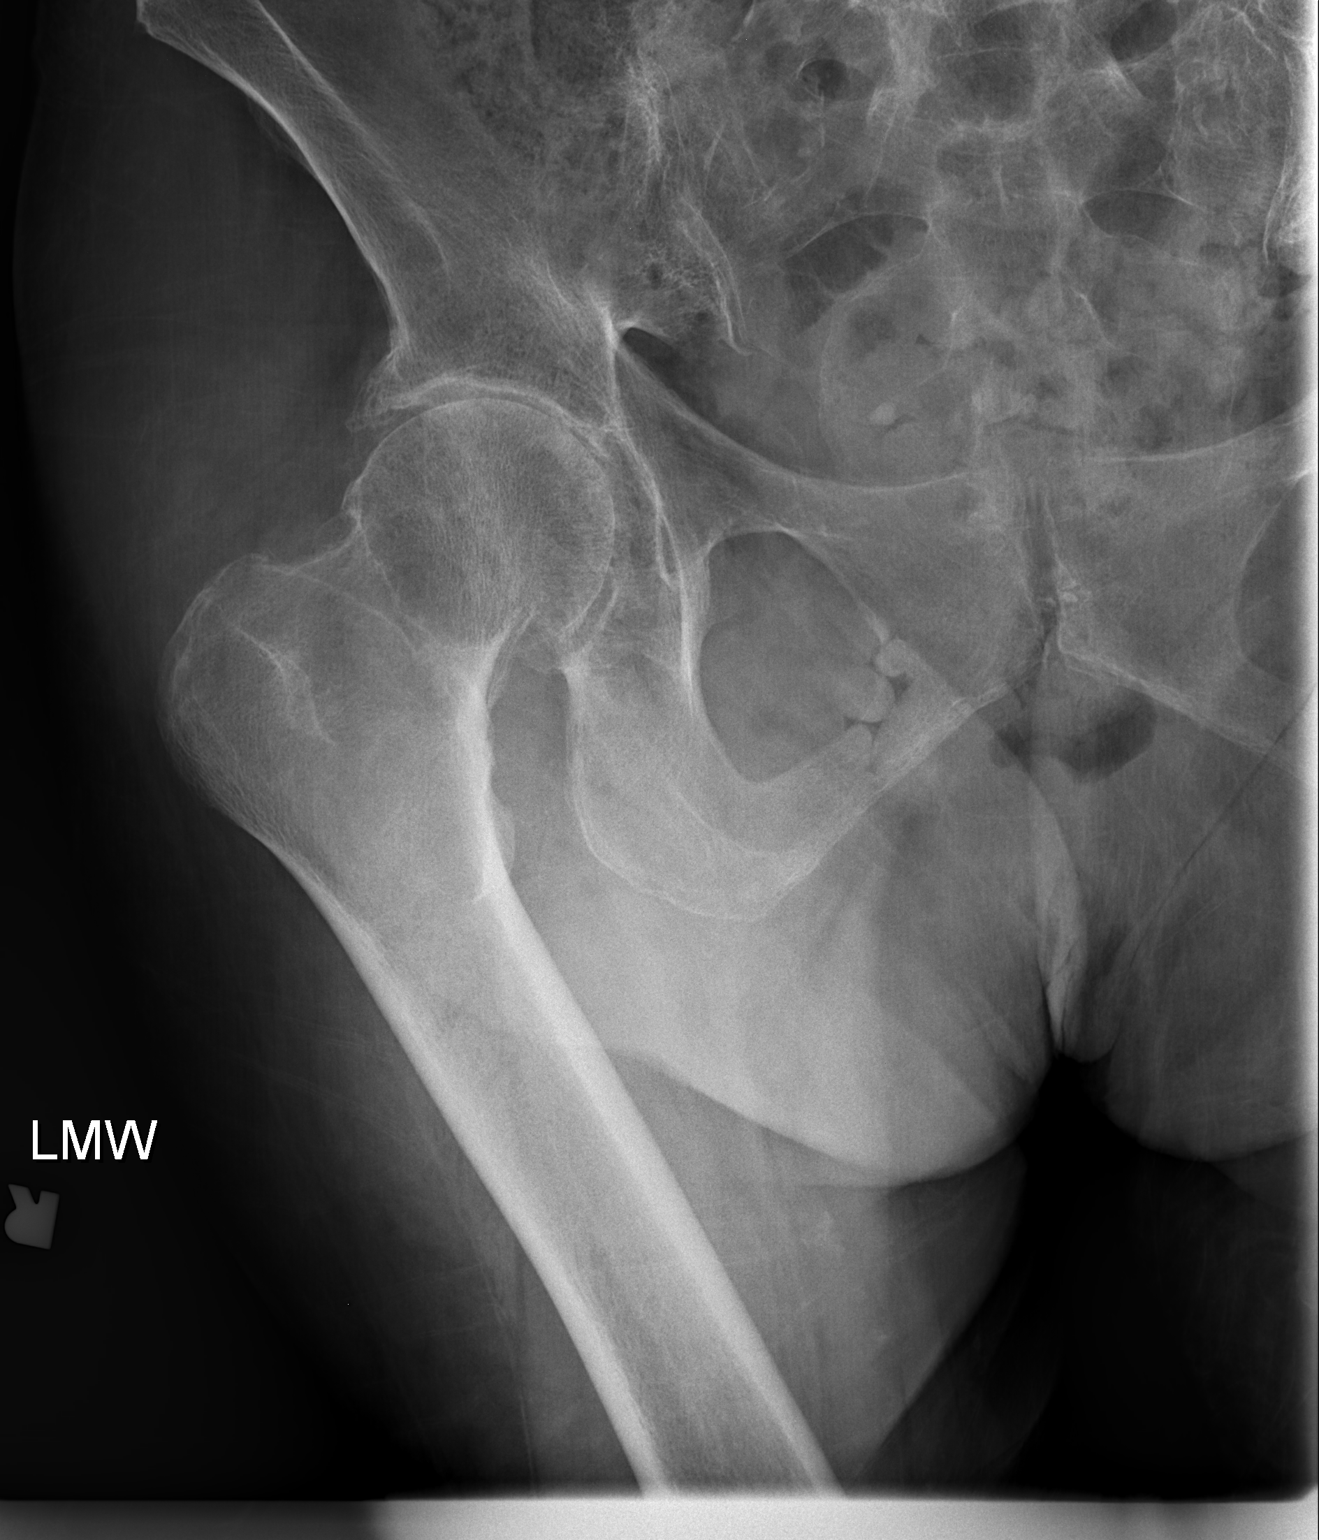
[im 3/3]
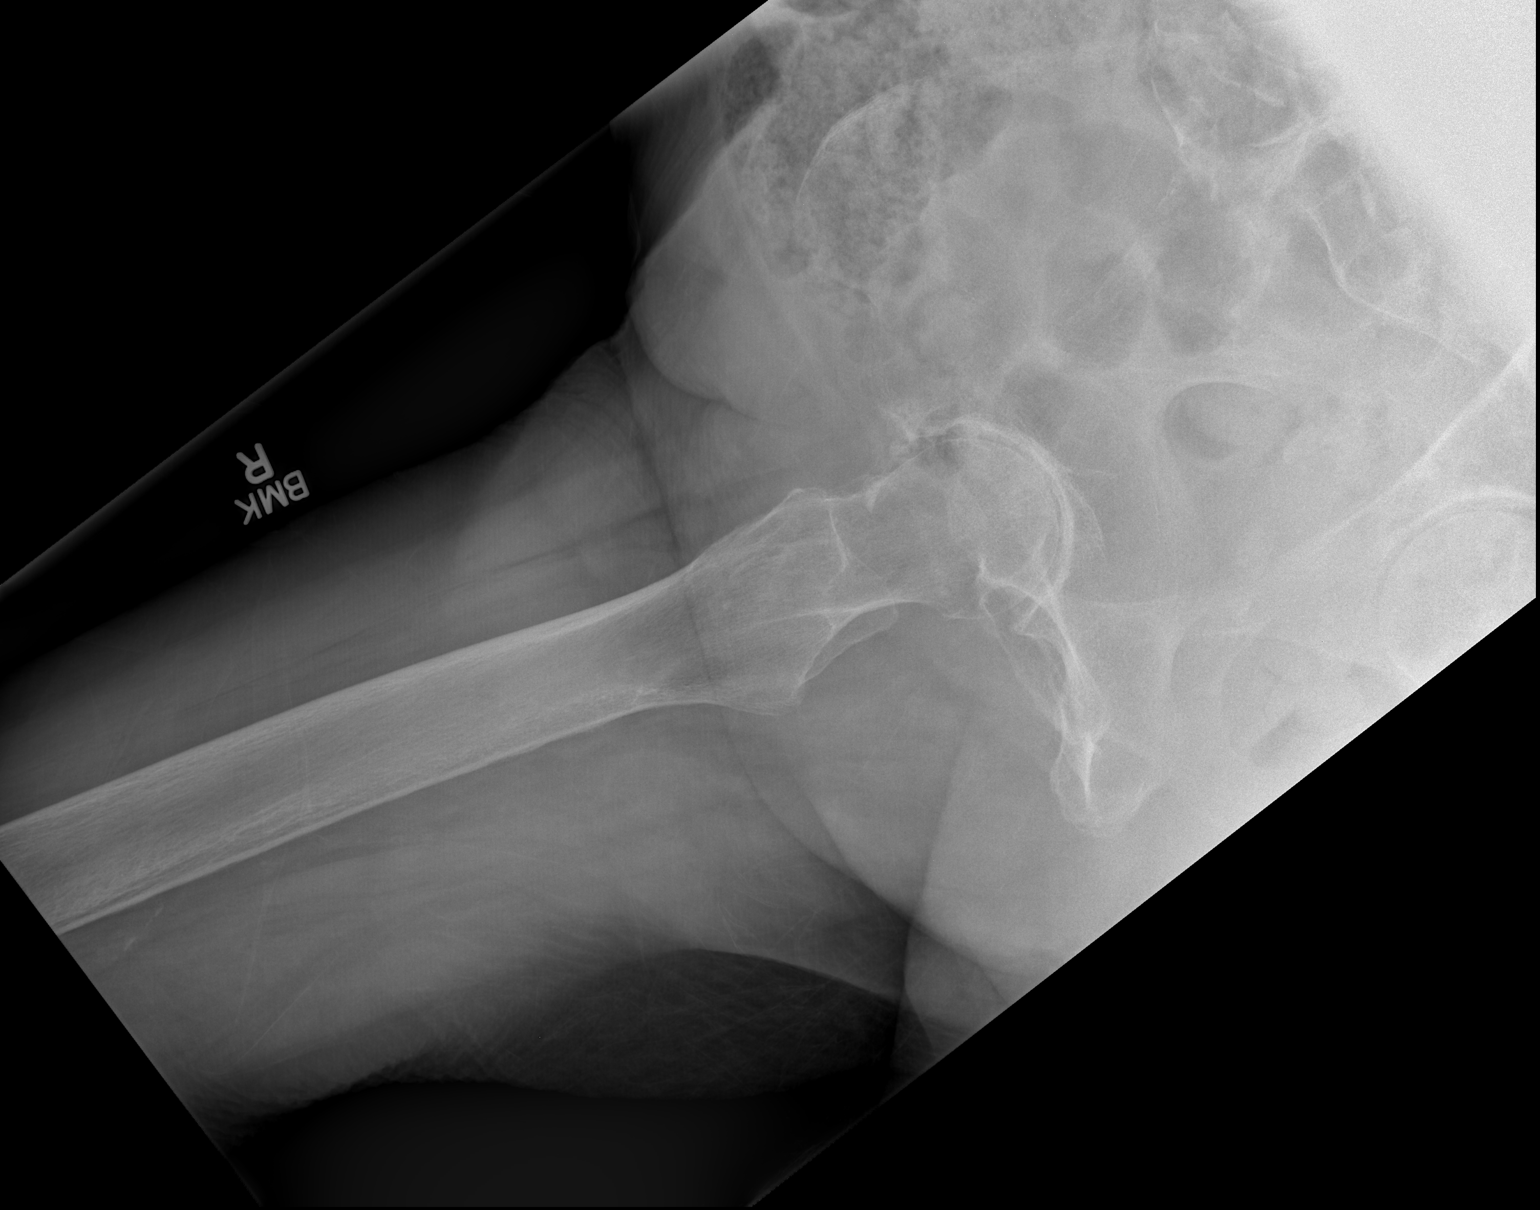

[3 of 3 positions shown; findings below may reference images not displayed]

FINDINGS: There is mild cortical irregularity about the right femoral head and
neck. This is thought to reflect overlying osteophytes, as the
patient has a significantly comminuted distal femoral fracture. Both
femoral heads are seated normally within their respective acetabula.
Axial joint space narrowing is noted at the right hip, with mild
underlying sclerotic change. The proximal right femur appears
intact. The sacroiliac joints demonstrate mild sclerotic change.

The visualized bowel gas pattern is grossly unremarkable in
appearance.
IMPRESSION: 1. Mild cortical irregularity about the right femoral head and neck;
this is thought to likely overlying osteophytes, as the patient has
a significantly comminuted distal femoral fracture. No definite
evidence of fracture or dislocation.
2. Axial joint space narrowing at the right hip, with mild
underlying sclerotic change.

## 2016-12-16 IMAGING — CR DG KNEE 1-2V*R*
1 series · 3 of 3 positions shown · non-contrast
Comparison: None.

CLINICAL DATA: Status post unwitnessed fall. Right knee pain and
swelling. Initial encounter.

EXAM:
RIGHT KNEE - 1-2 VIEW

[Series 1: x knee ap right · 0.14mm/px · 3 of 3 slices shown]
[im 1/3]
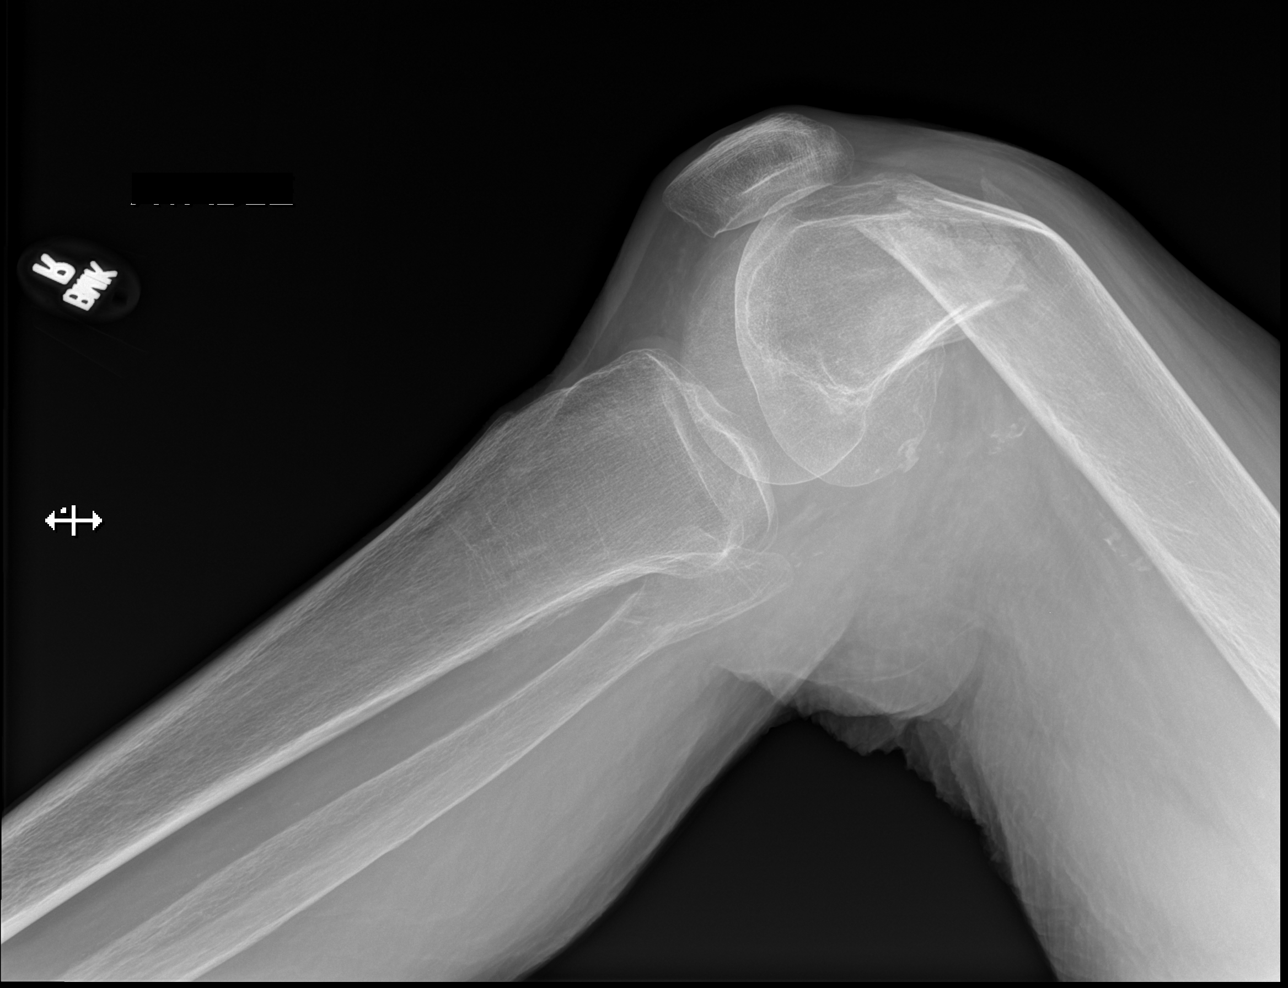
[im 2/3]
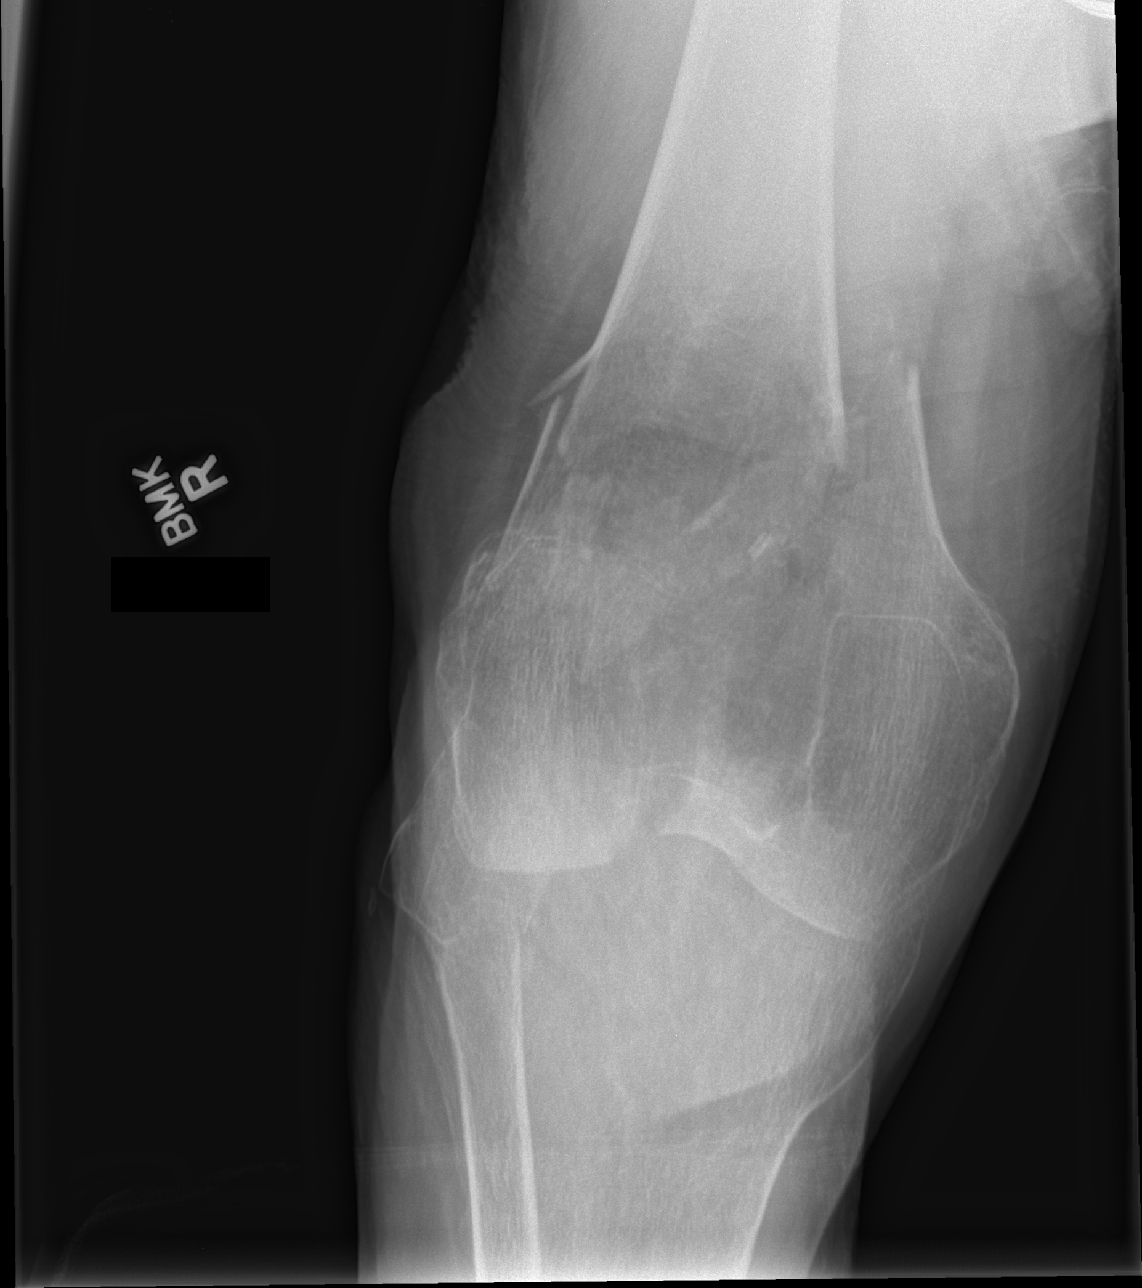
[im 3/3]
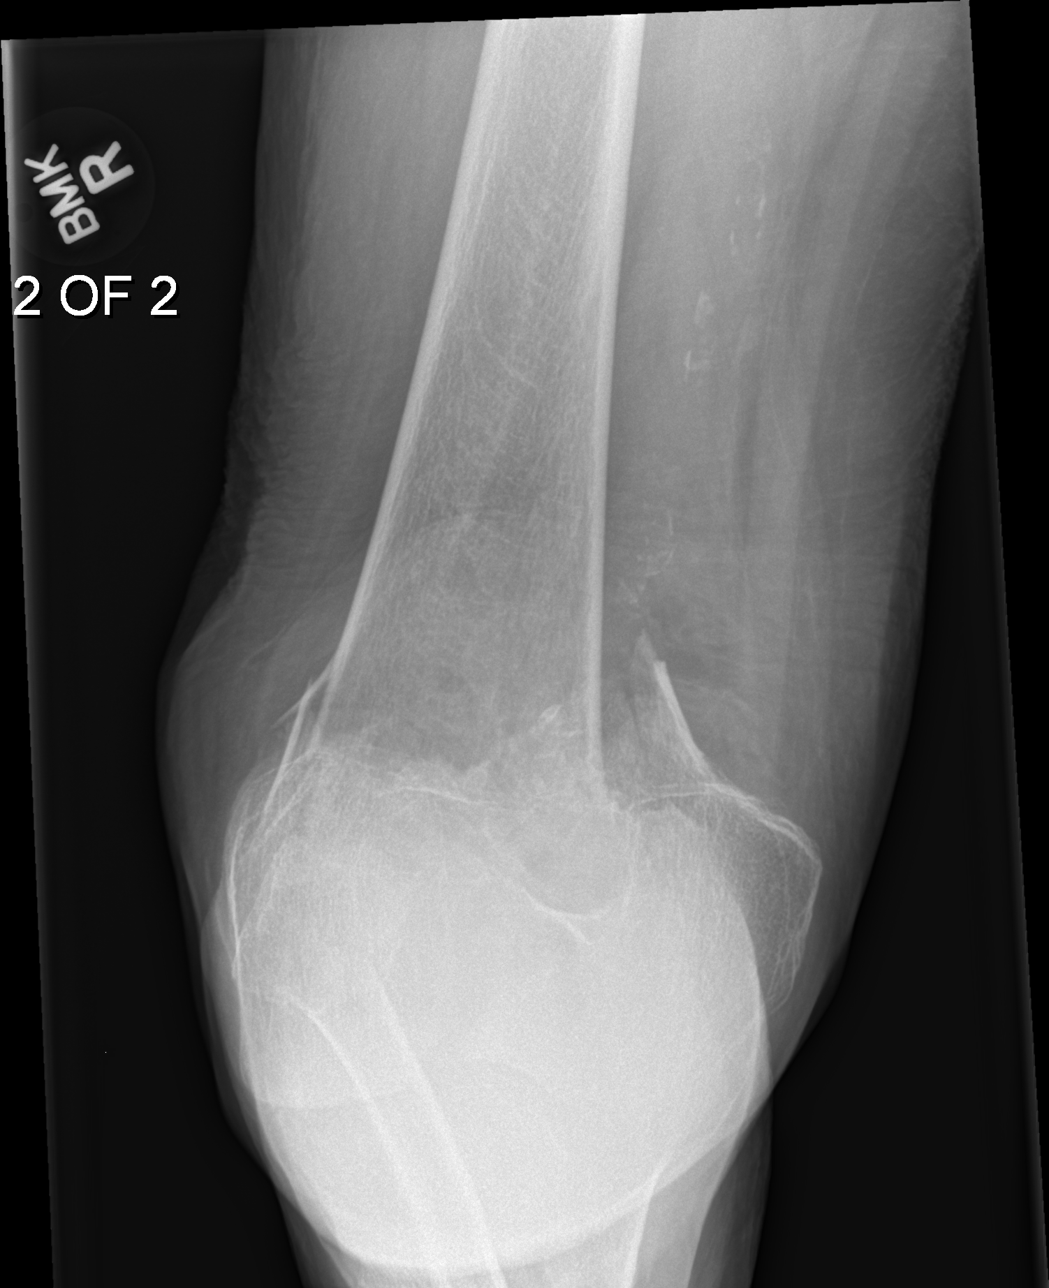

[3 of 3 positions shown; findings below may reference images not displayed]

FINDINGS: There is a comminuted fracture involving the distal femoral
diaphysis, with mild impaction, posterior displacement and
significant posterior angulation. Per clinical correlation, the
fracture is locked in this posteriorly angulated alignment. There
appears to be a displaced fracture line extending to the medial
aspect of the lateral femoral condyle, with approximately 1.1 cm of
separation at the joint space.

No definite knee joint effusion is seen. The patella is grossly
unremarkable. There is suggestion of a minimally displaced fibular
head fracture. The proximal tibia is not well assessed on this
study, though no definite tibial fracture is seen. Scattered
vascular calcifications are seen.
IMPRESSION: 1. Comminuted fracture involving the distal femoral diaphysis, with
mild impaction, posterior displacement and significant posterior
angulation. Per clinical correlation, the fracture is locked in this
posteriorly angulated alignment. Displaced fracture line extends to
the medial aspect of the lateral femoral condyle, with approximately
1.1 cm of separation at the joint space.
2. Suggestion of minimally displaced fibular head fracture. Proximal
tibia not well assessed, though no definite tibial fracture is seen.
3. Scattered vascular calcifications noted.

## 2016-12-16 IMAGING — CT CT OF THE RIGHT KNEE WITHOUT CONTRAST
4 series · 11 of 20 positions shown, 12 images · non-contrast
Comparison: Radiographs earlier today, 04/28/2014.

CLINICAL DATA: Distal femur fracture. Preoperative planning. Distal
femur fracture on radiographs earlier today. Fall.

EXAM:
CT OF THE RIGHT KNEE WITHOUT CONTRAST
TECHNIQUE: Multidetector CT imaging of the RIGHT knee was performed according
to the standard protocol. Multiplanar CT image reconstructions were
also generated.

[Series 2: knee · axial · 0.42mm/px · z∈[-116,+0]mm · 4 of 129 slices shown, 5 images]
[im 26/129  soft-tissue]
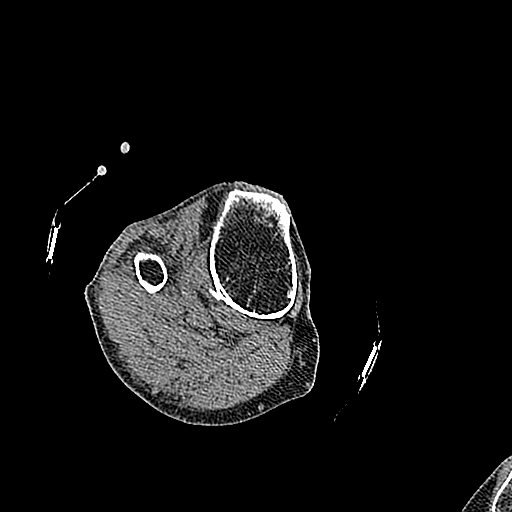
[im 26/129  bone]
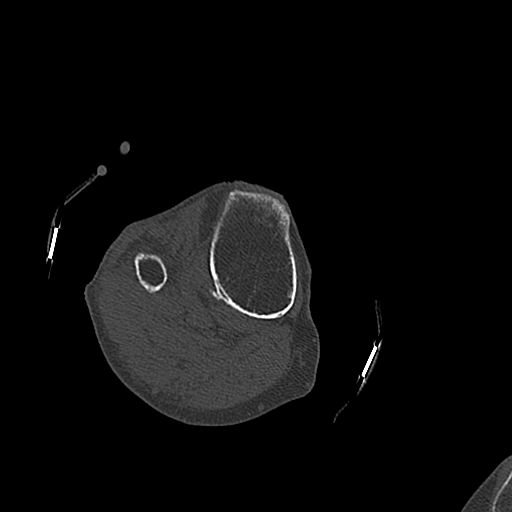
[im 52/129  bone]
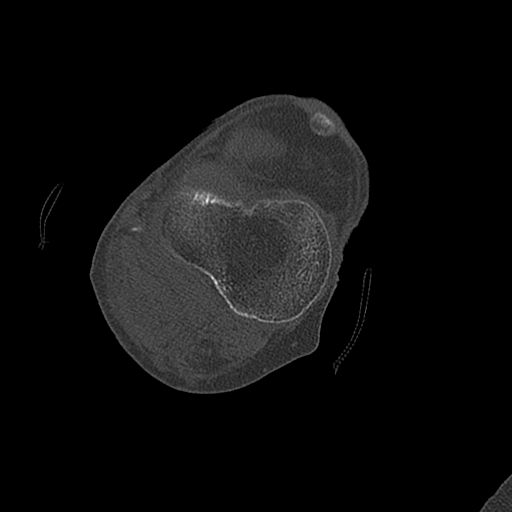
[im 77/129  bone]
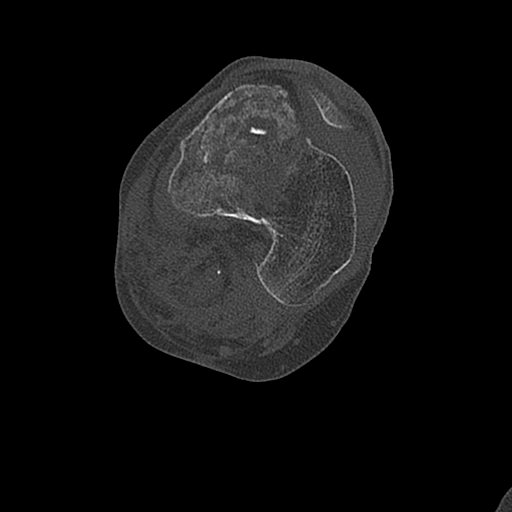
[im 103/129  bone]
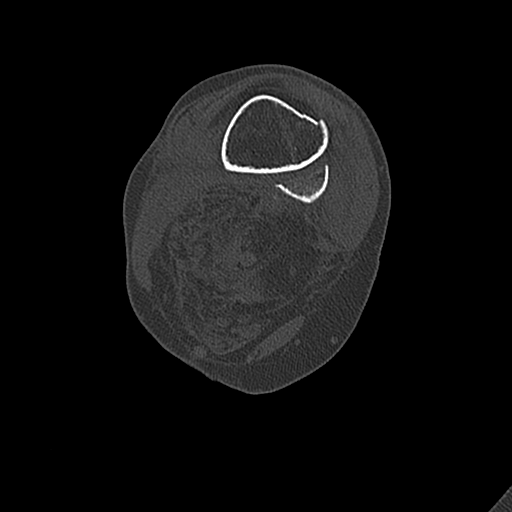

[Series 5: axial bone 2 · axial · 0.40mm/px · z∈[-103,-41]mm · 2 of 95 slices shown]
[im 32/95  bone]
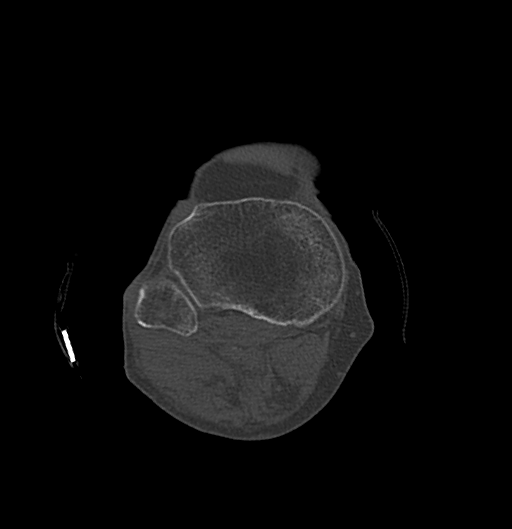
[im 63/95  bone]
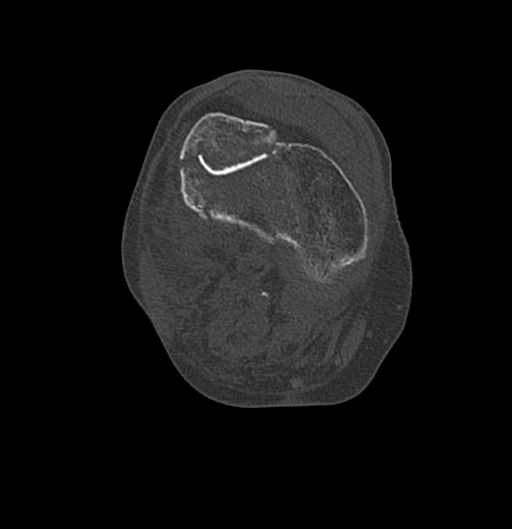

[Series 8: axial st 2 · axial · 0.40mm/px · z∈[-111,-53]mm · 2 of 87 slices shown]
[im 29/87  bone]
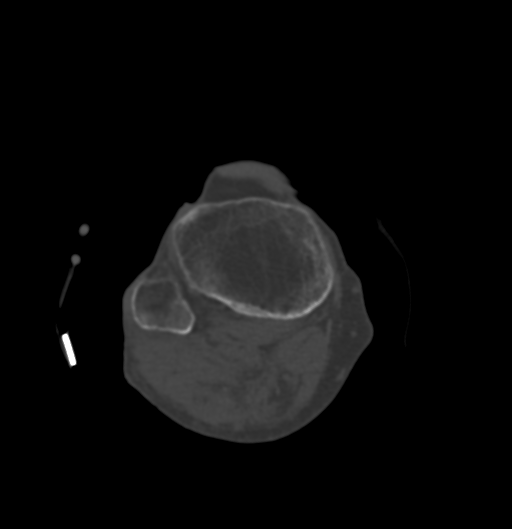
[im 58/87  bone]
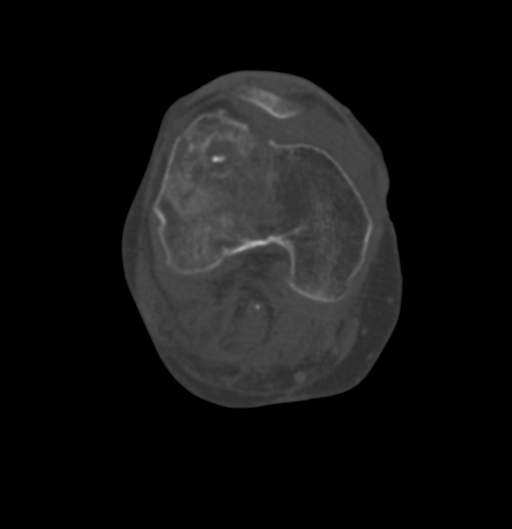

[Series 9: cor st · coronal · 0.42mm/px · 3 of 72 slices shown]
[im 15/72  bone]
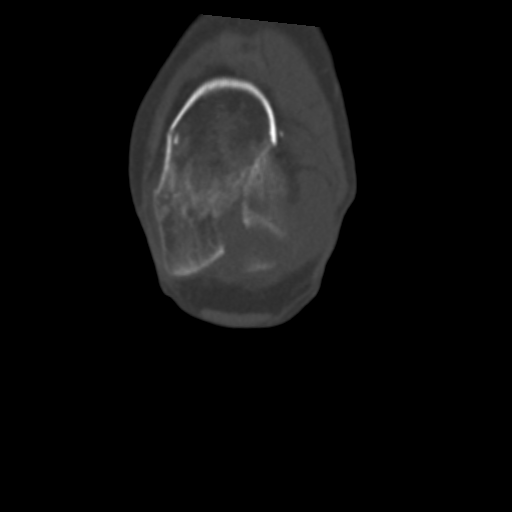
[im 29/72  bone]
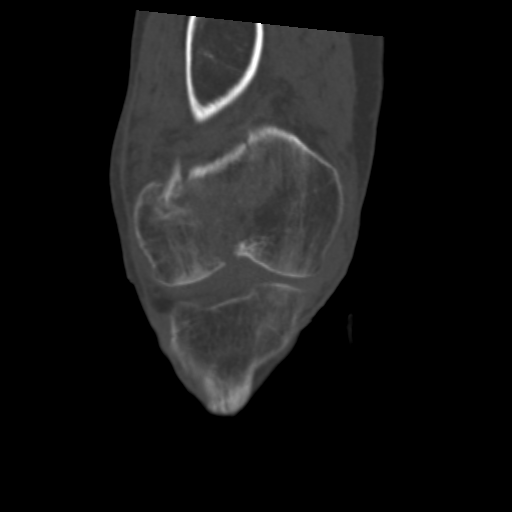
[im 43/72  bone]
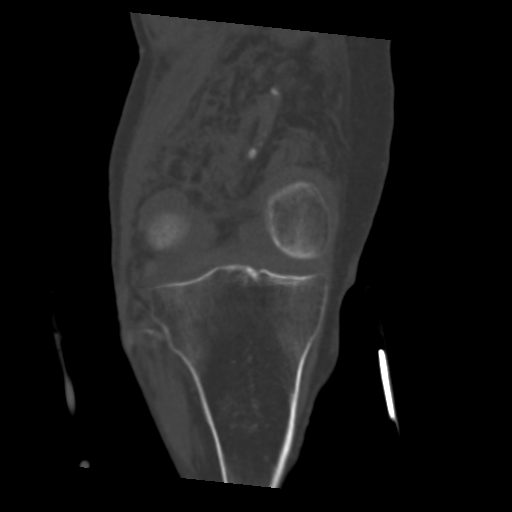

[11 of 20 positions shown; findings below may reference images not displayed]

FINDINGS: Distal femoral metaphysis fracture is present which extends into the
intercondylar notch of the femur, with 10 mm of distraction of the
trochlear sulcus. Distraction of the weight-bearing femoral condyles
is minimal, measuring only a few mm. The distal femur fracture shows
1 shaft width posterior displacement. This measures about 23 mm.
Apex anterior angulation.

On the sagittal images, the dominant transverse fracture plane is
extra-articular, with intra-articular extension in the intercondylar
region. The intercondylar fracture plane is sagittally oriented.

Diffuse osteopenia. There is also a nondisplaced fibular neck
fracture. The tibial plateau is intact. Large knee effusion is
present. The patella is intact.

Atherosclerosis is incidentally noted. Diffuse edema in the
popliteal fossa.
IMPRESSION: 1. Comminuted distal RIGHT femoral fracture. The dominant fracture
plane is transversely oriented with apex anterior angulation and
posterior displacement of the femoral condyles relative to the
femoral metaphysis.
2. Intra-articular extension of the fracture into the intercondylar
notch, with distraction of the trochlear sulcus measuring 1 cm.
Associated hemarthrosis.
3. Nondisplaced fibular neck fracture.

## 2016-12-16 IMAGING — CT CT CERVICAL SPINE WITHOUT CONTRAST
4 series · 16 of 33 positions shown, 19 images · non-contrast
Comparison: None.

CLINICAL DATA: [AGE] female post unwitnessed fall.

EXAM:
CT CERVICAL SPINE WITHOUT CONTRAST
TECHNIQUE: Multidetector CT imaging of the cervical spine was performed without
intravenous contrast. Multiplanar CT image reconstructions were also
generated.

[Series 3: c spine soft · axial · 0.33mm/px · z∈[-210,-158]mm · 3 of 77 slices shown]
[im 13/77  soft-tissue]
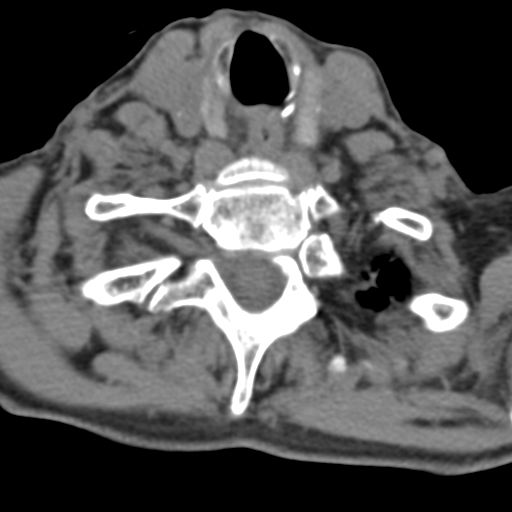
[im 26/77  soft-tissue]
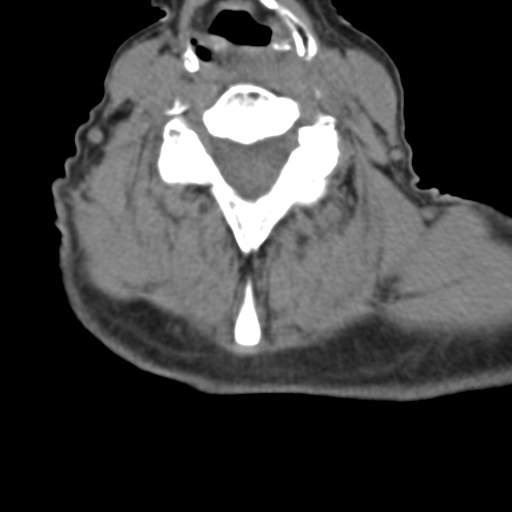
[im 39/77  soft-tissue]
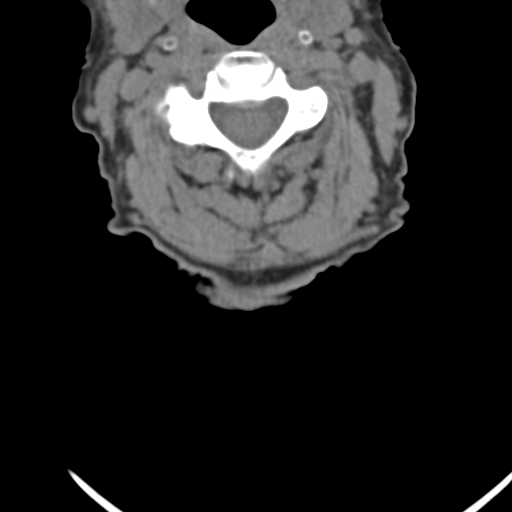

[Series 4: sag bone · sagittal · 0.35mm/px · 5 of 44 slices shown, 6 images]
[im 15/44  bone]
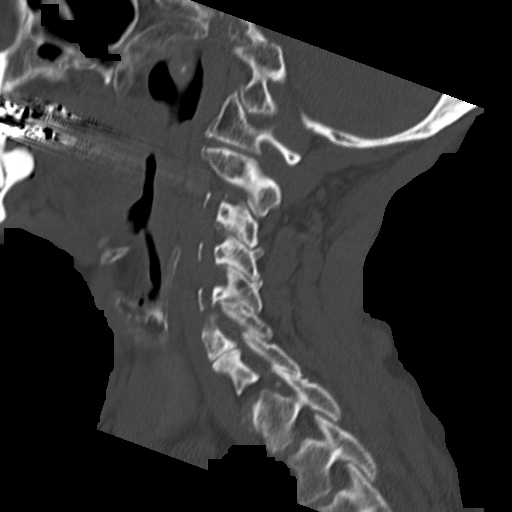
[im 18/44  bone]
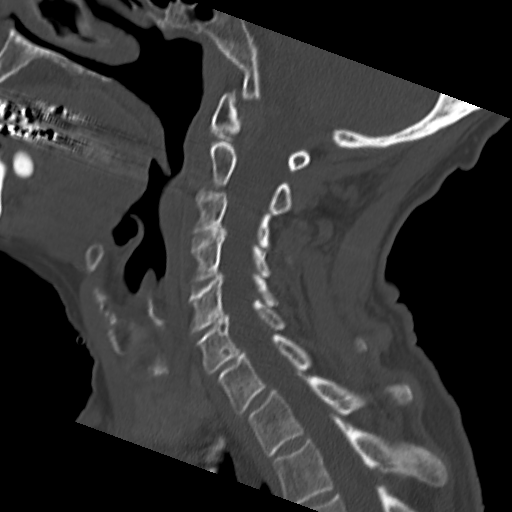
[im 22/44  soft-tissue]
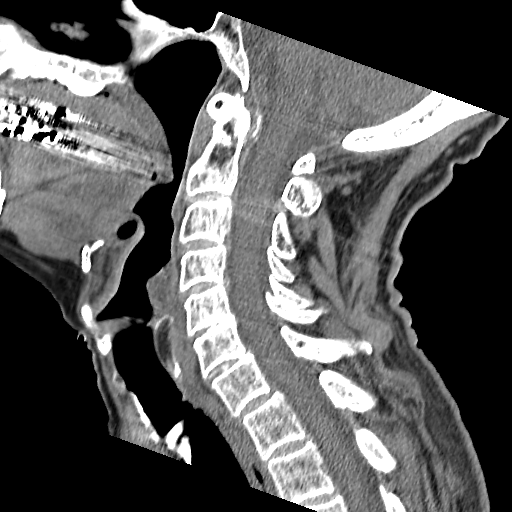
[im 22/44  bone]
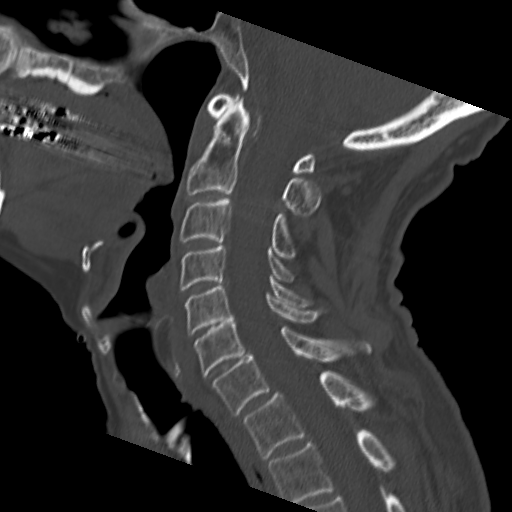
[im 26/44  bone]
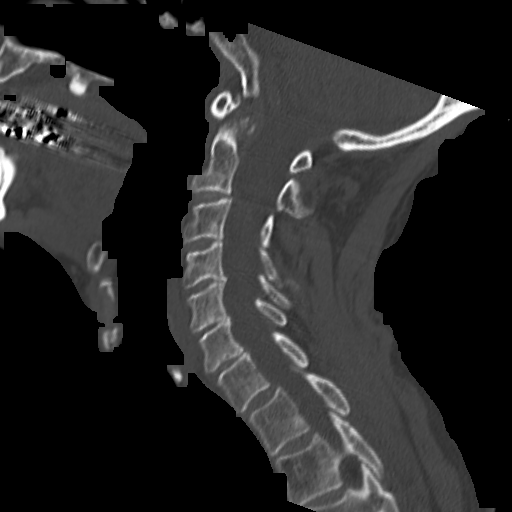
[im 29/44  bone]
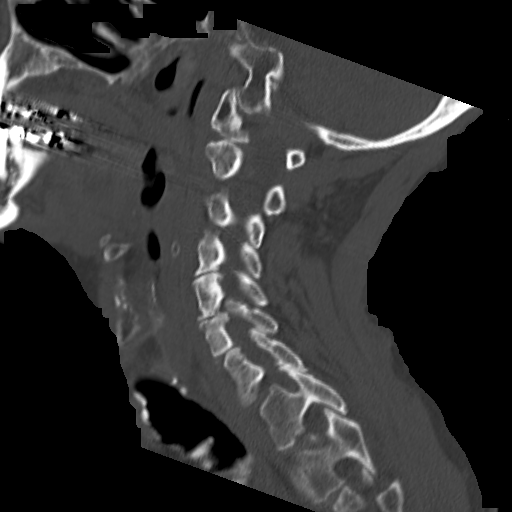

[Series 5: cor bone · coronal · 0.33mm/px · 3 of 47 slices shown]
[im 10/47  bone]
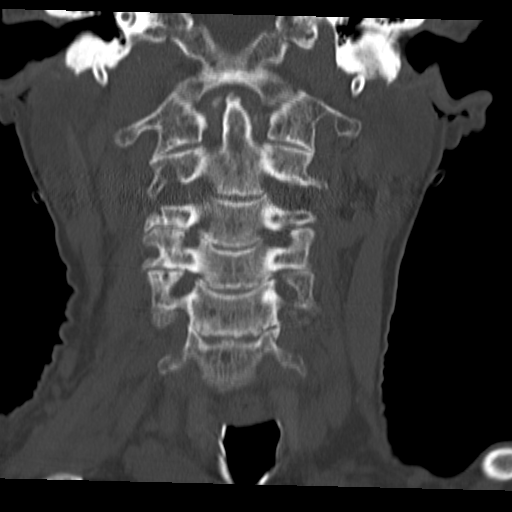
[im 19/47  bone]
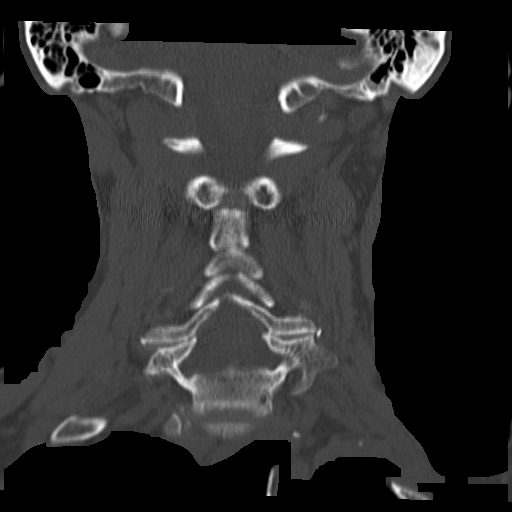
[im 28/47  bone]
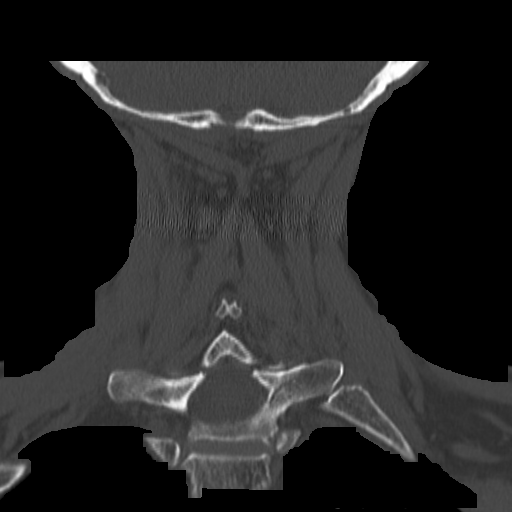

[Series 6: orthogonal axials · axial · 0.29mm/px · z∈[-246,-151]mm · 5 of 78 slices shown, 7 images]
[im 13/78  soft-tissue]
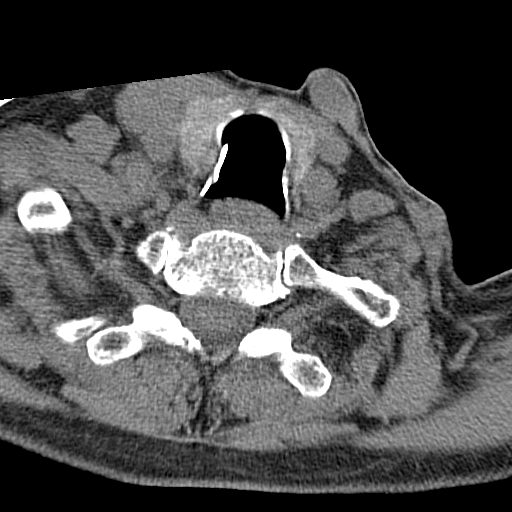
[im 13/78  bone]
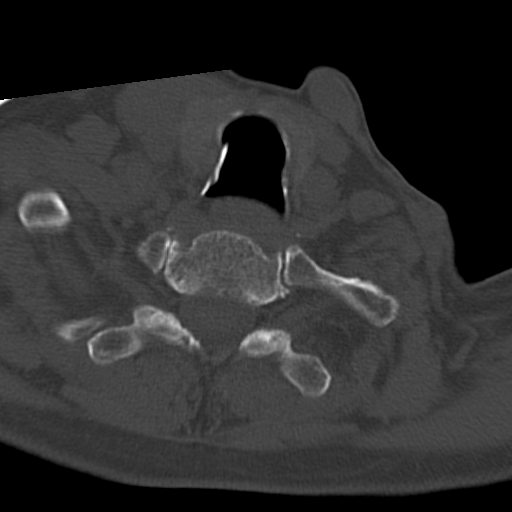
[im 26/78  bone]
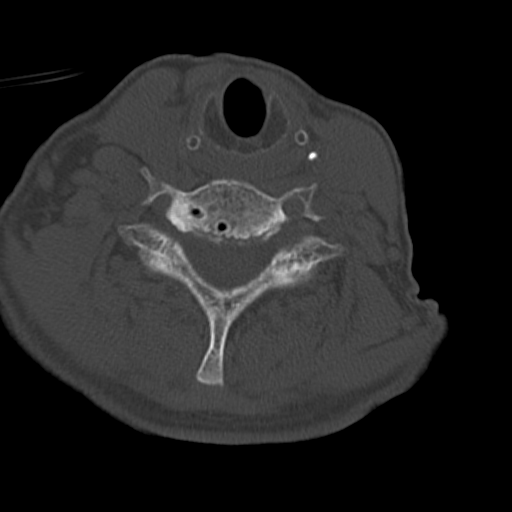
[im 39/78  bone]
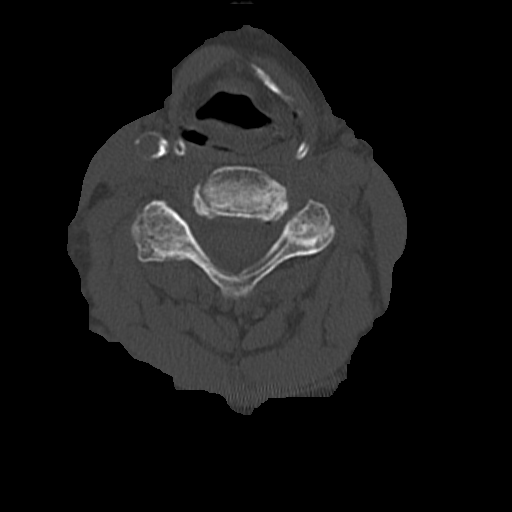
[im 52/78  bone]
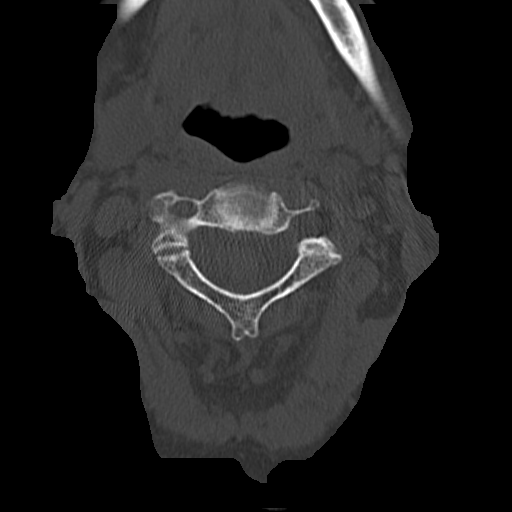
[im 65/78  soft-tissue]
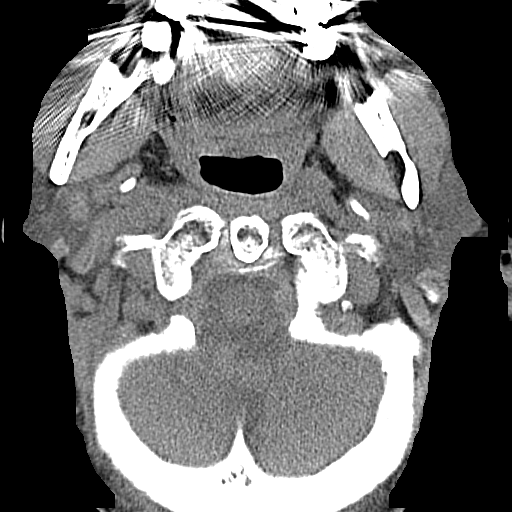
[im 65/78  bone]
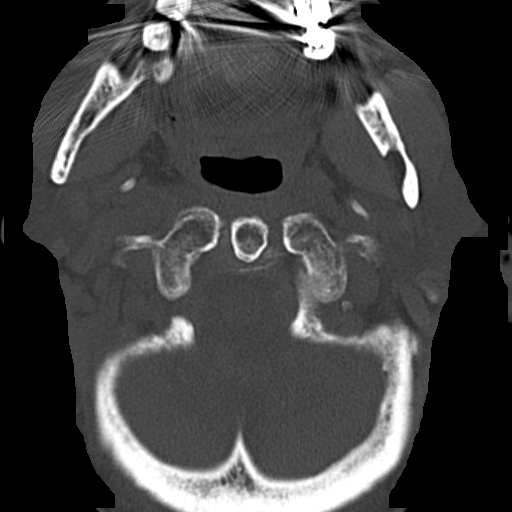

[16 of 33 positions shown; findings below may reference images not displayed]

FINDINGS: Trace 1-2 mm anterolisthesis of C6 on C7, appears degenerative. The
alignment is otherwise maintained. Vertebral body heights are
preserved. There is no fracture. There is degenerative disc disease
at C4-C5, C5-C6 and C6-C7. There is marked multilevel facet
arthropathy. The dens is intact. There are no jumped or perched
facets. No prevertebral soft tissue edema. There is atherosclerosis
of the carina vasculature. Chronic left apical pleural thickening
with calcifications.
IMPRESSION: 1. No fracture or subluxation of the cervical spine.
2. Multilevel degenerative disc disease and facet arthropathy.
Minimal anterolisthesis of C6 on C7 appears degenerative.
